# Patient Record
Sex: Male | Born: 1955 | Race: Black or African American | Hispanic: No | Marital: Single | Smoking: Current every day smoker
Health system: Southern US, Community
[De-identification: ages and names within clinical notes are randomized; demographics above are authoritative.]

## PROBLEM LIST (undated history)

## (undated) DIAGNOSIS — C61 Malignant neoplasm of prostate: Secondary | ICD-10-CM

## (undated) DIAGNOSIS — E119 Type 2 diabetes mellitus without complications: Secondary | ICD-10-CM

## (undated) DIAGNOSIS — I10 Essential (primary) hypertension: Secondary | ICD-10-CM

## (undated) DIAGNOSIS — J40 Bronchitis, not specified as acute or chronic: Secondary | ICD-10-CM

## (undated) DIAGNOSIS — Z87438 Personal history of other diseases of male genital organs: Secondary | ICD-10-CM

## (undated) DIAGNOSIS — E785 Hyperlipidemia, unspecified: Secondary | ICD-10-CM

## (undated) DIAGNOSIS — Z8719 Personal history of other diseases of the digestive system: Secondary | ICD-10-CM

---

## 2007-03-08 ENCOUNTER — Emergency Department (HOSPITAL_COMMUNITY): Admission: EM | Admit: 2007-03-08 | Discharge: 2007-03-08 | Payer: Self-pay | Admitting: Emergency Medicine

## 2007-03-09 ENCOUNTER — Ambulatory Visit (HOSPITAL_COMMUNITY): Payer: Self-pay | Admitting: Orthopaedic Surgery

## 2007-03-09 ENCOUNTER — Encounter (HOSPITAL_COMMUNITY): Admission: RE | Admit: 2007-03-09 | Discharge: 2007-04-08 | Payer: Self-pay | Admitting: Orthopaedic Surgery

## 2008-07-19 DIAGNOSIS — Z8711 Personal history of peptic ulcer disease: Secondary | ICD-10-CM

## 2008-07-19 HISTORY — DX: Personal history of peptic ulcer disease: Z87.11
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
## 2011-04-30 LAB — DIFFERENTIAL
Basophils Absolute: 0
Basophils Relative: 0
Eosinophils Absolute: 0
Eosinophils Relative: 1
Eosinophils Relative: 1
Lymphs Abs: 2.6
Monocytes Absolute: 0.5
Monocytes Relative: 9

## 2011-04-30 LAB — CBC
HCT: 49.7
HCT: 53 — ABNORMAL HIGH
Hemoglobin: 17.3 — ABNORMAL HIGH
MCHC: 34.8
MCV: 94.1
MCV: 94.6
Platelets: 195
RDW: 12.8
WBC: 9.4

## 2011-04-30 LAB — SEDIMENTATION RATE: Sed Rate: 2

## 2011-04-30 LAB — WOUND CULTURE

## 2011-09-23 ENCOUNTER — Encounter (HOSPITAL_COMMUNITY): Payer: Self-pay | Admitting: *Deleted

## 2011-09-23 ENCOUNTER — Emergency Department (HOSPITAL_COMMUNITY): Payer: Managed Care, Other (non HMO)

## 2011-09-23 ENCOUNTER — Emergency Department (HOSPITAL_COMMUNITY)
Admission: EM | Admit: 2011-09-23 | Discharge: 2011-09-23 | Disposition: A | Discharge status: Home / Self Care | Payer: Managed Care, Other (non HMO) | Attending: Emergency Medicine | Admitting: Emergency Medicine

## 2011-09-23 DIAGNOSIS — I10 Essential (primary) hypertension: Secondary | ICD-10-CM | POA: Insufficient documentation

## 2011-09-23 DIAGNOSIS — J40 Bronchitis, not specified as acute or chronic: Secondary | ICD-10-CM | POA: Insufficient documentation

## 2011-09-23 DIAGNOSIS — R05 Cough: Secondary | ICD-10-CM | POA: Insufficient documentation

## 2011-09-23 DIAGNOSIS — J3489 Other specified disorders of nose and nasal sinuses: Secondary | ICD-10-CM | POA: Insufficient documentation

## 2011-09-23 DIAGNOSIS — R509 Fever, unspecified: Secondary | ICD-10-CM | POA: Insufficient documentation

## 2011-09-23 DIAGNOSIS — Z79899 Other long term (current) drug therapy: Secondary | ICD-10-CM | POA: Insufficient documentation

## 2011-09-23 DIAGNOSIS — R059 Cough, unspecified: Secondary | ICD-10-CM | POA: Insufficient documentation

## 2011-09-23 DIAGNOSIS — E119 Type 2 diabetes mellitus without complications: Secondary | ICD-10-CM | POA: Insufficient documentation

## 2011-09-23 HISTORY — DX: Essential (primary) hypertension: I10

## 2011-09-23 MED ORDER — AZITHROMYCIN 250 MG PO TABS: 250.0000 mg | ORAL_TABLET | Freq: Every day | ORAL | Status: AC

## 2011-09-23 MED ORDER — ALBUTEROL SULFATE HFA 108 (90 BASE) MCG/ACT IN AERS: 1.0000 | INHALATION_SPRAY | Freq: Four times a day (QID) | RESPIRATORY_TRACT | Status: DC | PRN

## 2011-09-23 NOTE — ED Notes (Signed)
States fever at times also.

## 2011-09-23 NOTE — ED Provider Notes (Signed)
History    Scribed for Shelda Jakes, MD, the patient was seen in room APA19/APA19. This chart was scribed by Katha Cabal.   CSN: [id number]  Arrival date & time 09/23/11  1044   First MD Initiated Contact with Patient 09/23/11 1117      Chief Complaint  Patient presents with  . Cough    (Consider location/radiation/quality/duration/timing/severity/associated sxs/prior treatment) HPI  Pt was seen at 11:37 AM   Brandon Elliott is a 56 y.o. male who presents to the Emergency Department complaining of persistent productive cough for about 2 weeks.  Cough with yellow sputum.  Wife reports that patient's cough is worse at night.  Symptoms are associated with fever and nasal congestion.  Patient taking Mucinex with some relief.  Symptoms are not associated with headache, rash, neck pain, abdominal or back pain.    PCP Paulene Floor [place], [place])     Past Medical History  Diagnosis Date  . Diabetes mellitus   . Hypertension     History reviewed. No pertinent past surgical history.  No family history on file.  History  Substance Use Topics  . Smoking status: Current Everyday Smoker  . Smokeless tobacco: Not on file  . Alcohol Use: Yes      Review of Systems  All other systems reviewed and are negative.   Remaining review of systems negative except as noted in the HPI.   Allergies  Review of patient's allergies indicates no known allergies.  Home Medications   Current Outpatient Rx  Name Route Sig Dispense Refill  . AMLODIPINE BESYLATE 5 MG PO TABS Oral Take 5 mg by mouth daily.    . GUAIFENESIN ER 600 MG PO TB12 Oral Take 1,200 mg by mouth 2 (two) times daily.    Marland Kitchen LISINOPRIL-HYDROCHLOROTHIAZIDE 20-12.5 MG PO TABS Oral Take 1 tablet by mouth 2 (two) times daily.    Marland Kitchen NAPHAZOLINE HCL 0.012 % OP SOLN Both Eyes Place 1 drop into both eyes daily as needed. Red Eyes    . SITAGLIPTIN-METFORMIN HCL 50-500 MG PO TABS Oral Take 1 tablet by mouth 2 (two) times daily with  a meal.    . ALBUTEROL SULFATE HFA 108 (90 BASE) MCG/ACT IN AERS Inhalation Inhale 1-2 puffs into the lungs every 6 (six) hours as needed for wheezing. 1 Inhaler 0  . AZITHROMYCIN 250 MG PO TABS Oral Take 1 tablet (250 mg total) by mouth daily. Take first 2 tablets together, then 1 every day until finished. 6 tablet 0    BP 152/90  Pulse 107  Temp 98.6 F (37 C)  Resp 16  Ht 5\' 7"  (1.702 m)  Wt 185 lb (83.915 kg)  BMI 28.97 kg/m2  SpO2 99%  Physical Exam  Nursing note and vitals reviewed. Constitutional: He is oriented to person, place, and time. He appears well-developed and well-nourished. No distress.  HENT:  Head: Normocephalic and atraumatic.  Mouth/Throat: Uvula is midline and oropharynx is clear and moist. Mucous membranes are not dry.  Eyes: Conjunctivae and EOM are normal.  Neck: Neck supple.  Cardiovascular: Normal rate, regular rhythm and normal heart sounds.   No murmur heard. Pulmonary/Chest: Effort normal and breath sounds normal. No respiratory distress. He has no wheezes.  Abdominal: Soft. Bowel sounds are normal. There is no tenderness. There is no rebound and no guarding.  Musculoskeletal: Normal range of motion. He exhibits no edema.  Lymphadenopathy:    He has no cervical adenopathy.  Neurological: He is alert and oriented  to person, place, and time. No cranial nerve deficit or sensory deficit. Coordination normal.  Skin: Skin is warm and dry. No rash noted.  Psychiatric: He has a normal mood and affect. His behavior is normal.    ED Course  Procedures (including critical care time)   DIAGNOSTIC STUDIES: Oxygen Saturation is 99% on room air, normal by my interpretation.     COORDINATION OF CARE: 11:41 AM  Physical exam complete.  Radiological findings discussed with patient. CXR negative.      LABS / RADIOLOGY:   Labs Reviewed  GLUCOSE, CAPILLARY - Abnormal; Notable for the following:    Glucose-Capillary 142 (*)    All other components within  normal limits   Results for orders placed during the hospital encounter of 09/23/11  GLUCOSE, CAPILLARY      Component Value Range   Glucose-Capillary 142 (*) 70 - 99 (mg/dL)   Comment 1 Notify RN      Dg Chest 2 View  09/23/2011  *RADIOLOGY REPORT*  Clinical Data: Cough, congestion  CHEST - 2 VIEW  Comparison: None.  Findings: No active infiltrate or effusion is seen.  Mediastinal contours appear normal.  The heart is within normal limits in size. No bony abnormality is seen.  IMPRESSION: No active lung disease.  Original Report Authenticated By: Juline Patch, M.D.         MDM  Chest x-ray negative for pneumonia patient's symptoms consistent with upper respiratory infection most likely viral but with productive cough has a bronchitis component. Will give a trial of Zithromax antibiotic. Will start albuterol inhaler 2 puffs every 6 hours for the next week. Patient will continue Mucinex.       MEDICATIONS GIVEN IN THE E.D. Scheduled Meds:   Continuous Infusions:       IMPRESSION: 1. Bronchitis      NEW MEDICATIONS: New Prescriptions   ALBUTEROL (PROVENTIL HFA;VENTOLIN HFA) 108 (90 BASE) MCG/ACT INHALER    Inhale 1-2 puffs into the lungs every 6 (six) hours as needed for wheezing.   AZITHROMYCIN (ZITHROMAX) 250 MG TABLET    Take 1 tablet (250 mg total) by mouth daily. Take first 2 tablets together, then 1 every day until finished.      I personally performed the services described in this documentation, which was scribed in my presence. The recorded information has been reviewed and considered.        Shelda Jakes, MD 09/23/11 [phone]

## 2011-09-23 NOTE — ED Notes (Signed)
Pt c/o cough for several weeks, pt states that it will get better then get worse again, c/o weakness when the cough is worse. Fever at times.

## 2011-09-23 NOTE — ED Notes (Signed)
Pt states productive cough, yellow in color with chest congestion x 1 - 2 weeks. NAD.

## 2011-09-23 NOTE — Discharge Instructions (Signed)
Bronchitis Bronchitis is a problem of the air tubes leading to your lungs. This problem makes it hard for air to get in and out of the lungs. You may cough a lot because your air tubes are narrow. Going without care can cause lasting (chronic) bronchitis. HOME CARE   Drink enough fluids to keep your pee (urine) clear or pale yellow.   Use a cool mist humidifier.   Quit smoking if you smoke. If you keep smoking, the bronchitis might not get better.   Only take medicine as told by your doctor.  GET HELP RIGHT AWAY IF:   Coughing keeps you awake.   You start to wheeze.   You become more sick or weak.   You have a hard time breathing or get short of breath.   You cough up blood.   Coughing lasts more than 2 weeks.   You have a fever.   Your baby is older than 3 months with a rectal temperature of 102 F (38.9 C) or higher.   Your baby is 64 months old or younger with a rectal temperature of 100.4 F (38 C) or higher.  MAKE SURE YOU:  Understand these instructions.   Will watch your condition.   Will get help right away if you are not doing well or get worse.  Document Released: 12/22/2007 Document Revised: 06/24/2011 Document Reviewed: 06/06/2009 [facility] Patient Information 2012 [place], [place].  Use albuterol inhaler 2 puffs every 6 hours for the next week continue Mucinex take antibiotic as directed. Followup with your Dr. In [place] if not improving in 2-4 days. Return for new worse symptoms.

## 2011-09-23 NOTE — ED Notes (Signed)
Pt returned from xray

## 2012-02-06 ENCOUNTER — Encounter (HOSPITAL_COMMUNITY): Payer: Self-pay | Admitting: *Deleted

## 2012-02-06 ENCOUNTER — Emergency Department (HOSPITAL_COMMUNITY)
Admission: EM | Admit: 2012-02-06 | Discharge: 2012-02-06 | Disposition: A | Discharge status: Home / Self Care | Payer: Managed Care, Other (non HMO) | Attending: Emergency Medicine | Admitting: Emergency Medicine

## 2012-02-06 DIAGNOSIS — F17200 Nicotine dependence, unspecified, uncomplicated: Secondary | ICD-10-CM | POA: Insufficient documentation

## 2012-02-06 DIAGNOSIS — N390 Urinary tract infection, site not specified: Secondary | ICD-10-CM | POA: Insufficient documentation

## 2012-02-06 DIAGNOSIS — I10 Essential (primary) hypertension: Secondary | ICD-10-CM | POA: Insufficient documentation

## 2012-02-06 DIAGNOSIS — E119 Type 2 diabetes mellitus without complications: Secondary | ICD-10-CM | POA: Insufficient documentation

## 2012-02-06 LAB — CBC WITH DIFFERENTIAL/PLATELET
Basophils Absolute: 0 10*3/uL (ref 0.0–0.1)
Eosinophils Absolute: 0.1 10*3/uL (ref 0.0–0.7)
Eosinophils Relative: 1 % (ref 0–5)
Lymphs Abs: 2.7 10*3/uL (ref 0.7–4.0)
MCH: 32.9 pg (ref 26.0–34.0)
MCV: 93.9 fL (ref 78.0–100.0)
Monocytes Absolute: 0.8 10*3/uL (ref 0.1–1.0)
Platelets: 192 10*3/uL (ref 150–400)
RDW: 12.8 % (ref 11.5–15.5)

## 2012-02-06 LAB — BASIC METABOLIC PANEL
Calcium: 10 mg/dL (ref 8.4–10.5)
Creatinine, Ser: 0.87 mg/dL (ref 0.50–1.35)
GFR calc non Af Amer: >90 mL/min (ref >90)
Glucose, Bld: 165 mg/dL — ABNORMAL HIGH (ref 70–99)
Sodium: 133 mEq/L — ABNORMAL LOW (ref 135–145)

## 2012-02-06 LAB — URINE MICROSCOPIC-ADD ON

## 2012-02-06 LAB — URINALYSIS, ROUTINE W REFLEX MICROSCOPIC
Ketones, ur: 15 mg/dL — AB
Protein, ur: NEGATIVE mg/dL
Urobilinogen, UA: 0.2 mg/dL (ref 0.0–1.0)

## 2012-02-06 MED ORDER — ONDANSETRON 8 MG PO TBDP
8.0000 mg | ORAL_TABLET | Freq: Once | ORAL | Status: AC
  Administered 2012-02-06: 8 mg via ORAL
  Filled 2012-02-06: qty 1

## 2012-02-06 MED ORDER — CEPHALEXIN 500 MG PO CAPS: 500.0000 mg | ORAL_CAPSULE | Freq: Four times a day (QID) | ORAL | Status: AC

## 2012-02-06 MED ORDER — CEPHALEXIN 500 MG PO CAPS
500.0000 mg | ORAL_CAPSULE | Freq: Once | ORAL | Status: AC
  Administered 2012-02-06: 500 mg via ORAL
  Filled 2012-02-06: qty 1

## 2012-02-06 MED ORDER — OXYCODONE-ACETAMINOPHEN 5-325 MG PO TABS
2.0000 | ORAL_TABLET | Freq: Once | ORAL | Status: AC
  Administered 2012-02-06: 2 via ORAL
  Filled 2012-02-06: qty 2

## 2012-02-06 NOTE — ED Provider Notes (Signed)
History     CSN: [id number]  Arrival date & time 02/06/12  0148   First MD Initiated Contact with Patient 02/06/12 0208      Chief Complaint - abdominal pain   Patient is a 56 y.o. male presenting with abdominal pain. The history is provided by the patient.  Abdominal Pain The primary symptoms of the illness include abdominal pain, nausea, vomiting and dysuria. The primary symptoms of the illness do not include fever, shortness of breath, diarrhea, hematemesis or hematochezia. The current episode started 6 to 12 hours ago. The onset of the illness was gradual. The problem has not changed since onset. The illness is associated with alcohol use. The patient has not had a change in bowel habit. Symptoms associated with the illness do not include chills, constipation or back pain. Significant associated medical issues include PUD.  Pt reports he drank "bootleg liquor" last night He woke up this morning with lower abdominal pain - feels like "gas in my stomach" He reports nonbloody emesis He does not recall having this previously No focal weakness He does report dysuria Denies CP/SOB He has normal BM this past morning, no blood reported  Past Medical History  Diagnosis Date  . Diabetes mellitus   . Hypertension     History reviewed. No pertinent past surgical history.  History reviewed. No pertinent family history.  History  Substance Use Topics  . Smoking status: Current Everyday Smoker  . Smokeless tobacco: Not on file  . Alcohol Use: Yes      Review of Systems  Constitutional: Negative for fever and chills.  Respiratory: Negative for shortness of breath.   Cardiovascular: Negative for chest pain.  Gastrointestinal: Positive for nausea, vomiting and abdominal pain. Negative for diarrhea, constipation, hematochezia and hematemesis.  Genitourinary: Positive for dysuria.  Musculoskeletal: Negative for back pain.  All other systems reviewed and are  negative.    Allergies  Review of patient's allergies indicates no known allergies.  Home Medications   Current Outpatient Rx  Name Route Sig Dispense Refill  . LISINOPRIL-HYDROCHLOROTHIAZIDE 20-12.5 MG PO TABS Oral Take 1 tablet by mouth 2 (two) times daily.    Marland Kitchen NAPHAZOLINE HCL 0.012 % OP SOLN Both Eyes Place 1 drop into both eyes daily as needed. Red Eyes    . SITAGLIPTIN-METFORMIN HCL 50-500 MG PO TABS Oral Take 1 tablet by mouth 2 (two) times daily with a meal.    . ALBUTEROL SULFATE HFA 108 (90 BASE) MCG/ACT IN AERS Inhalation Inhale 1-2 puffs into the lungs every 6 (six) hours as needed for wheezing. 1 Inhaler 0  . AMLODIPINE BESYLATE 5 MG PO TABS Oral Take 5 mg by mouth daily.    . GUAIFENESIN ER 600 MG PO TB12 Oral Take 1,200 mg by mouth 2 (two) times daily.      BP 165/87  Pulse 84  Temp 98.7 F (37.1 C) (Oral)  Resp 18  Ht 5\' 7"  (1.702 m)  Wt 220 lb (99.791 kg)  BMI 34.46 kg/m2  SpO2 99%  Physical Exam CONSTITUTIONAL: Well developed/well nourished HEAD AND FACE: Normocephalic/atraumatic EYES: EOMI/PERRL ENMT: Mucous membranes moist NECK: supple no meningeal signs SPINE:entire spine nontender CV: S1/S2 noted, no murmurs/rubs/gallops noted LUNGS: Lungs are clear to auscultation bilaterally, no apparent distress ABDOMEN: soft, mild suprapubic tenderness, no rebound or guarding, +BS GU:no cva tenderness, uncircumcised, no hernia or testicular tenderness, chaperone present Rectal - stool color normal, no mass, no abscess, and prostate not enlarged and nontender NEURO: Pt is awake/alert,  moves all extremitiesx4 EXTREMITIES: pulses normal, full ROM SKIN: warm, color normal PSYCH: no abnormalities of mood noted  ED Course  Procedures    Labs Reviewed  CBC WITH DIFFERENTIAL  BASIC METABOLIC PANEL  URINALYSIS, ROUTINE W REFLEX MICROSCOPIC  OCCULT BLOOD X 1 CARD TO LAB, STOOL  3:17 AM Pt well appearing He does not want IV meds Will give PO challenge, check  labs/urine and reassess  4:12 AM Will treat for UTI Keflex started Urine culture sent He is nontoxic in appearance.  He feels improved Stable for d/c home We discussed strict return precautions  MDM  Nursing notes including past medical history and social history reviewed and considered in documentation All labs/vitals reviewed and considered         Joya Gaskins, MD 02/06/12 [phone]

## 2012-02-06 NOTE — ED Notes (Signed)
occult blood test  NEGATIVE / performed by this RN

## 2012-02-06 NOTE — ED Notes (Signed)
Verified with Laboratory that urine culture will be set up

## 2012-02-06 NOTE — ED Notes (Addendum)
Pt reporting pain in lower abdomen, starting on Sat.  Reports pain does radiate around left side to back.  States he did vomit "a couple times". Reporting current nausea, decreased appetite.  LBM previously today. States "It just feels like there is a gas pocket in my stomach".

## 2012-02-07 LAB — OCCULT BLOOD, POC DEVICE: Fecal Occult Bld: NEGATIVE

## 2012-02-08 LAB — URINE CULTURE: Colony Count: >=100000

## 2012-02-09 NOTE — ED Notes (Signed)
+   Urine Patient treated with Keflex-sensitive to same-chart appended per protocol MD. 

## 2012-03-18 ENCOUNTER — Emergency Department (HOSPITAL_COMMUNITY): Payer: Managed Care, Other (non HMO)

## 2012-03-18 ENCOUNTER — Emergency Department (HOSPITAL_COMMUNITY)
Admission: EM | Admit: 2012-03-18 | Discharge: 2012-03-18 | Disposition: A | Discharge status: Home / Self Care | Payer: Managed Care, Other (non HMO) | Attending: Emergency Medicine | Admitting: Emergency Medicine

## 2012-03-18 ENCOUNTER — Encounter (HOSPITAL_COMMUNITY): Payer: Self-pay | Admitting: *Deleted

## 2012-03-18 DIAGNOSIS — R51 Headache: Secondary | ICD-10-CM | POA: Insufficient documentation

## 2012-03-18 DIAGNOSIS — S0120XA Unspecified open wound of nose, initial encounter: Secondary | ICD-10-CM | POA: Insufficient documentation

## 2012-03-18 DIAGNOSIS — Z79899 Other long term (current) drug therapy: Secondary | ICD-10-CM | POA: Insufficient documentation

## 2012-03-18 DIAGNOSIS — I10 Essential (primary) hypertension: Secondary | ICD-10-CM | POA: Insufficient documentation

## 2012-03-18 DIAGNOSIS — S0230XA Fracture of orbital floor, unspecified side, initial encounter for closed fracture: Secondary | ICD-10-CM | POA: Insufficient documentation

## 2012-03-18 DIAGNOSIS — F17200 Nicotine dependence, unspecified, uncomplicated: Secondary | ICD-10-CM | POA: Insufficient documentation

## 2012-03-18 DIAGNOSIS — W19XXXA Unspecified fall, initial encounter: Secondary | ICD-10-CM | POA: Insufficient documentation

## 2012-03-18 DIAGNOSIS — E119 Type 2 diabetes mellitus without complications: Secondary | ICD-10-CM | POA: Insufficient documentation

## 2012-03-18 MED ORDER — HYDROCODONE-ACETAMINOPHEN 5-325 MG PO TABS: 1.0000 | ORAL_TABLET | Freq: Four times a day (QID) | ORAL | Status: AC | PRN
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
## 2012-06-14 ENCOUNTER — Emergency Department (HOSPITAL_COMMUNITY)
Admission: EM | Admit: 2012-06-14 | Discharge: 2012-06-14 | Disposition: A | Discharge status: Home / Self Care | Payer: Managed Care, Other (non HMO) | Attending: Emergency Medicine | Admitting: Emergency Medicine

## 2012-06-14 ENCOUNTER — Encounter (HOSPITAL_COMMUNITY): Payer: Self-pay | Admitting: *Deleted

## 2012-06-14 DIAGNOSIS — A599 Trichomoniasis, unspecified: Secondary | ICD-10-CM

## 2012-06-14 DIAGNOSIS — I10 Essential (primary) hypertension: Secondary | ICD-10-CM | POA: Insufficient documentation

## 2012-06-14 DIAGNOSIS — R1032 Left lower quadrant pain: Secondary | ICD-10-CM | POA: Insufficient documentation

## 2012-06-14 DIAGNOSIS — R000 Tachycardia, unspecified: Secondary | ICD-10-CM | POA: Insufficient documentation

## 2012-06-14 DIAGNOSIS — Z87448 Personal history of other diseases of urinary system: Secondary | ICD-10-CM | POA: Insufficient documentation

## 2012-06-14 DIAGNOSIS — F17200 Nicotine dependence, unspecified, uncomplicated: Secondary | ICD-10-CM | POA: Insufficient documentation

## 2012-06-14 DIAGNOSIS — Z79899 Other long term (current) drug therapy: Secondary | ICD-10-CM | POA: Insufficient documentation

## 2012-06-14 DIAGNOSIS — E119 Type 2 diabetes mellitus without complications: Secondary | ICD-10-CM | POA: Insufficient documentation

## 2012-06-14 LAB — COMPREHENSIVE METABOLIC PANEL
ALT: 24 U/L (ref 0–53)
AST: 17 U/L (ref 0–37)
Albumin: 3.7 g/dL (ref 3.5–5.2)
CO2: 28 mEq/L (ref 19–32)
Chloride: 94 mEq/L — ABNORMAL LOW (ref 96–112)
GFR calc non Af Amer: 89 mL/min — ABNORMAL LOW (ref >90)
Potassium: 3.8 mEq/L (ref 3.5–5.1)
Sodium: 133 mEq/L — ABNORMAL LOW (ref 135–145)
Total Bilirubin: 0.6 mg/dL (ref 0.3–1.2)

## 2012-06-14 LAB — URINE MICROSCOPIC-ADD ON

## 2012-06-14 LAB — CBC WITH DIFFERENTIAL/PLATELET
Basophils Absolute: 0 10*3/uL (ref 0.0–0.1)
Basophils Relative: 0 % (ref 0–1)
HCT: 43.2 % (ref 39.0–52.0)
Lymphocytes Relative: 19 % (ref 12–46)
MCHC: 34.5 g/dL (ref 30.0–36.0)
Neutro Abs: 6.6 10*3/uL (ref 1.7–7.7)
Neutrophils Relative %: 75 % (ref 43–77)
Platelets: 160 10*3/uL (ref 150–400)
RDW: 13 % (ref 11.5–15.5)
WBC: 8.8 10*3/uL (ref 4.0–10.5)

## 2012-06-14 LAB — URINALYSIS, ROUTINE W REFLEX MICROSCOPIC
Glucose, UA: 500 mg/dL — AB
Leukocytes, UA: NEGATIVE
Nitrite: NEGATIVE
Urobilinogen, UA: 0.2 mg/dL (ref 0.0–1.0)

## 2012-06-14 LAB — MAGNESIUM: Magnesium: 1.6 mg/dL (ref 1.5–2.5)

## 2012-06-14 MED ORDER — CIPROFLOXACIN HCL 250 MG PO TABS
500.0000 mg | ORAL_TABLET | Freq: Once | ORAL | Status: AC
  Administered 2012-06-14: 500 mg via ORAL
  Filled 2012-06-14: qty 2

## 2012-06-14 MED ORDER — CIPROFLOXACIN HCL 500 MG PO TABS: 500.0000 mg | ORAL_TABLET | Freq: Two times a day (BID) | ORAL | Status: DC

## 2012-06-14 MED ORDER — METRONIDAZOLE 500 MG PO TABS
500.0000 mg | ORAL_TABLET | Freq: Once | ORAL | Status: AC
  Administered 2012-06-14: 500 mg via ORAL
  Filled 2012-06-14: qty 1

## 2012-06-14 MED ORDER — HYDROCODONE-ACETAMINOPHEN 5-325 MG PO TABS: ORAL_TABLET | ORAL | Status: DC

## 2012-06-14 MED ORDER — METRONIDAZOLE 500 MG PO TABS: 500.0000 mg | ORAL_TABLET | Freq: Three times a day (TID) | ORAL | Status: DC

## 2012-06-14 NOTE — Discharge Instructions (Signed)
DRINK MORE FLUIDS!!! Take the antibiotics until gone. Take the norco for pain as needed. Return to the ED if you get a fever, worsening abdominal pain, vomiting, fever, get lightheaded, dizzy or feel worse in any way.

## 2012-06-14 NOTE — ED Provider Notes (Signed)
History   This chart was scribed for Brandon Givens, MD scribed by Brandon Elliott. The patient was seen in room APA19/APA19 at 18:02   CSN: [id number]  Arrival date & time 06/14/12  1735   Chief Complaint  Patient presents with  . Dysuria    (Consider location/radiation/quality/duration/timing/severity/associated sxs/prior treatment) Patient is a 56 y.o. male presenting with dysuria. The history is provided by the patient. No language interpreter was used.  Dysuria    Brandon Elliott is a 56 y.o. male who presents to the Emergency Department complaining of constant  difficulty urinating with associated dysuria,  urgency, and left lower abd pain only with coughing and walking, onset 2 days ago. Patient also notes constipation, explaining his last BM was yesterday evening, which he says was hard and abnormal for him. He says he normally does not experience constipation, but does report a hx of similar urinary sxs, citing he believes it was diagnosed as a  UTI earlier in the summer.  He denies frequency, nausea, vomitng, diarrhea, SOB, CP, dizziness, lightheadedness, or changes in medications or his diet recently.  The patient reportedly uses medications janumet for DM, tricor and Lipitor for high cholesterol, Lisinopril HCTZ and hydrochlorothiazide for HTN, and an albuterol inhaler.   PCP: PA Benay Pillow at [facility]   Past Medical History  Diagnosis Date  . Diabetes mellitus   . Hypertension   . Enlarged prostate     History reviewed. No pertinent past surgical history.  History reviewed. No pertinent family history.  History  Substance Use Topics  . Smoking status: Current Every Day Smoker    Types: Cigarettes  . Smokeless tobacco: Not on file  . Alcohol Use: Yes  Smokes 1 pack a day and drinks couple beers a day. 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     Review of Systems  Genitourinary: Positive for dysuria.   10 Systems reviewed and  are negative for acute change except as noted in the HPI. Allergies  Review of patient's allergies indicates no known allergies.  Home Medications   Current Outpatient Rx  Name  Route  Sig  Dispense  Refill  . ALBUTEROL SULFATE HFA 108 (90 BASE) MCG/ACT IN AERS   Inhalation   Inhale 1-2 puffs into the lungs every 6 (six) hours as needed for wheezing.   1 Inhaler   0   . HYDROCHLOROTHIAZIDE 12.5 MG PO CAPS   Oral   Take 12.5 mg by mouth daily.         Marland Kitchen LISINOPRIL-HYDROCHLOROTHIAZIDE 20-12.5 MG PO TABS   Oral   Take 1 tablet by mouth 2 (two) times daily.         Marland Kitchen SITAGLIPTIN-METFORMIN HCL 50-500 MG PO TABS   Oral   Take 1 tablet by mouth 2 (two) times daily with a meal.         lipitor tricor  BP 154/79  Pulse 130  Temp 99.9 F (37.7 C)  Resp 18  Ht 5\' 6"  (1.676 m)  Wt 195 lb (88.451 kg)  BMI 31.47 kg/m2  SpO2 100%  Vital signs normal hypertension and tachycardia with low-grade temp   Physical Exam  Nursing note and vitals reviewed. Constitutional: He is oriented to person, place, and time. He appears well-developed and well-nourished. No distress.  HENT:  Head: Normocephalic and atraumatic.  Eyes: Conjunctivae normal and EOM are normal. Pupils are equal, round, and reactive to light.  Neck: Neck supple. No tracheal  deviation present.  Cardiovascular: Regular rhythm.  Tachycardia present.   Pulmonary/Chest: Effort normal and breath sounds normal. No respiratory distress. He has no wheezes. He has no rales.  Abdominal: Soft. Bowel sounds are normal. He exhibits no distension.  Musculoskeletal: Normal range of motion.  Neurological: He is alert and oriented to person, place, and time. No sensory deficit.  Skin: Skin is warm and dry.  Psychiatric: He has a normal mood and affect. His behavior is normal.    ED Course  Procedures (including critical care time)   Medications  ciprofloxacin (CIPRO) tablet 500 mg (500 mg Oral Given 06/14/12 2042)    metroNIDAZOLE (FLAGYL) tablet 500 mg (500 mg Oral Given 06/14/12 2042)    DIAGNOSTIC STUDIES: Oxygen Saturation is 100% on room air, normal by my interpretation.    COORDINATION OF CARE: 18:05: Physical exam performed.   Bladder scan was 85 cc after voiding  19:45 VS shows he still has tachycardia.  20:00 have discussed his UA results. Pt still has some mild LLQ pain which can be early divertiulitis, will start on cipro/flagyl which will also treat his trichomoniasis. Pt wants to drink fluids instead of getting an IV, states he didn't eat or drink today because he was very busy, not because he felt bad.   21:16: Heart rate improved at 106. Patient encouraged to continue drinking fluids.   23:00 Hr gets down to 101 per wife. Pt drinking, but also sleeping, States he feels fine.   Orthostatic VS show + with pulses on standing, states he feels fine and  patient wants to go home. Have discussed his LLQ pain could be early diverticulitis, and will treat with antibiotics. Pt still does not want IV or CT scan to be done.    Results for orders placed during the hospital encounter of 06/14/12  URINALYSIS, ROUTINE W REFLEX MICROSCOPIC      Component Value Range   Color, Urine YELLOW  YELLOW   APPearance CLEAR  CLEAR   Specific Gravity, Urine 1.025  1.005 - 1.030   pH 6.0  5.0 - 8.0   Glucose, UA 500 (*) NEGATIVE mg/dL   Hgb urine dipstick SMALL (*) NEGATIVE   Bilirubin Urine SMALL (*) NEGATIVE   Ketones, ur 15 (*) NEGATIVE mg/dL   Protein, ur TRACE (*) NEGATIVE mg/dL   Urobilinogen, UA 0.2  0.0 - 1.0 mg/dL   Nitrite NEGATIVE  NEGATIVE   Leukocytes, UA NEGATIVE  NEGATIVE  CBC WITH DIFFERENTIAL      Component Value Range   WBC 8.8  4.0 - 10.5 K/uL   RBC 4.70  4.22 - 5.81 MIL/uL   Hemoglobin 14.9  13.0 - 17.0 g/dL   HCT 14.7  82.9 - 56.2 %   MCV 91.9  78.0 - 100.0 fL   MCH 31.7  26.0 - 34.0 pg   MCHC 34.5  30.0 - 36.0 g/dL   RDW 13.0  86.5 - 78.4 %   Platelets 160  150 - 400 K/uL    Neutrophils Relative 75  43 - 77 %   Neutro Abs 6.6  1.7 - 7.7 K/uL   Lymphocytes Relative 19  12 - 46 %   Lymphs Abs 1.6  0.7 - 4.0 K/uL   Monocytes Relative 6  3 - 12 %   Monocytes Absolute 0.5  0.1 - 1.0 K/uL   Eosinophils Relative 0  0 - 5 %   Eosinophils Absolute 0.0  0.0 - 0.7 K/uL   Basophils Relative 0  0 - 1 %   Basophils Absolute 0.0  0.0 - 0.1 K/uL  COMPREHENSIVE METABOLIC PANEL      Component Value Range   Sodium 133 (*) 135 - 145 mEq/L   Potassium 3.8  3.5 - 5.1 mEq/L   Chloride 94 (*) 96 - 112 mEq/L   CO2 28  19 - 32 mEq/L   Glucose, Bld 263 (*) 70 - 99 mg/dL   BUN 16  6 - 23 mg/dL   Creatinine, Ser 1.61  0.50 - 1.35 mg/dL   Calcium 9.7  8.4 - 09.6 mg/dL   Total Protein 7.4  6.0 - 8.3 g/dL   Albumin 3.7  3.5 - 5.2 g/dL   AST 17  0 - 37 U/L   ALT 24  0 - 53 U/L   Alkaline Phosphatase 78  39 - 117 U/L   Total Bilirubin 0.6  0.3 - 1.2 mg/dL   GFR calc non Af Amer 89 (*) >90 mL/min   GFR calc Af Amer >90  >90 mL/min  TROPONIN I      Component Value Range   Troponin I <0.30  <0.30 ng/mL  MAGNESIUM      Component Value Range   Magnesium 1.6  1.5 - 2.5 mg/dL  URINE MICROSCOPIC-ADD ON      Component Value Range   Squamous Epithelial / LPF RARE  RARE   WBC, UA 0-2  <3 WBC/hpf   RBC / HPF 3-6  <3 RBC/hpf   Bacteria, UA RARE  RARE   Urine-Other TRICHOMONAS PRESENT     Laboratory interpretation all normal except + trichomonas, hyperglycemia, low sodium and chloride     Date: 06/14/2012  Rate: 123  Rhythm: sinus tachycardia  QRS Axis: left  Intervals: normal  ST/T Wave abnormalities: normal  Conduction Disutrbances:none  Narrative Interpretation: Q waves inf/ant leads  Old EKG Reviewed: none available    1. LLQ abdominal pain   2. Trichomoniasis   3. Tachycardia    New Prescriptions   CIPROFLOXACIN (CIPRO) 500 MG TABLET    Take 1 tablet (500 mg total) by mouth 2 (two) times daily.   HYDROCODONE-ACETAMINOPHEN (NORCO) 5-325 MG PER TABLET    Take 1 or  2 po Q 6hrs for pain   METRONIDAZOLE (FLAGYL) 500 MG TABLET    Take 1 tablet (500 mg total) by mouth 3 (three) times daily.   Plan discharge  Devoria Albe, MD, FACEP    MDM   I personally performed the services described in this documentation, which was scribed in my presence. The recorded information has been reviewed and considered.  Devoria Albe, MD, Armando Gang         Brandon Givens, MD 06/14/12 [phone]

## 2012-06-14 NOTE — ED Notes (Signed)
abd pain and dysuria since yesterday,No NV.  Alert.fever

## 2012-06-14 NOTE — ED Notes (Signed)
Patient drinking po fluids

## 2012-06-14 NOTE — ED Notes (Signed)
Bladder scan performed with 97ml noted

## 2012-06-29 DIAGNOSIS — C61 Malignant neoplasm of prostate: Secondary | ICD-10-CM

## 2012-06-29 DIAGNOSIS — Z8546 Personal history of malignant neoplasm of prostate: Secondary | ICD-10-CM | POA: Insufficient documentation

## 2012-06-29 HISTORY — DX: Malignant neoplasm of prostate: C61

## 2012-09-04 ENCOUNTER — Encounter: Payer: Self-pay | Admitting: Radiation Oncology

## 2012-09-04 NOTE — Progress Notes (Addendum)
New Consult Prostate Cancer Biopsy 06/29/12= Adenocarcinoma,Gleason 3+4=7, & 3+3=6,Volume=38cc,PSA=4.23   Divorced, Naval architect, 4 children, fiance Sunday Shams,  Father deceased prostate cancer age 59, mother deceased stroke age 28, Patient  alert,oriented x3, no dysuria,  Nocturia x2 not fully emptying bladder sometimes, regular bowels, no c/o pain  Interested in seed implant  Allergies:NKDA  No history Radiation  No history of a Pacemaker

## 2012-09-05 ENCOUNTER — Ambulatory Visit
Admission: RE | Admit: 2012-09-05 | Discharge: 2012-09-05 | Disposition: A | Discharge status: Home / Self Care | Payer: Managed Care, Other (non HMO) | Source: Ambulatory Visit | Attending: Radiation Oncology | Admitting: Radiation Oncology

## 2012-09-05 ENCOUNTER — Encounter: Payer: Self-pay | Admitting: Radiation Oncology

## 2012-09-05 VITALS — BP 125/79 | HR 114 | Temp 98.4°F | Resp 20 | Ht 66.0 in | Wt 191.7 lb

## 2012-09-05 DIAGNOSIS — I10 Essential (primary) hypertension: Secondary | ICD-10-CM | POA: Insufficient documentation

## 2012-09-05 DIAGNOSIS — C61 Malignant neoplasm of prostate: Secondary | ICD-10-CM | POA: Insufficient documentation

## 2012-09-05 DIAGNOSIS — Z8042 Family history of malignant neoplasm of prostate: Secondary | ICD-10-CM | POA: Insufficient documentation

## 2012-09-05 DIAGNOSIS — E119 Type 2 diabetes mellitus without complications: Secondary | ICD-10-CM | POA: Insufficient documentation

## 2012-09-05 HISTORY — DX: Malignant neoplasm of prostate: C61

## 2012-09-05 HISTORY — DX: Hyperlipidemia, unspecified: E78.5

## 2012-09-05 NOTE — Progress Notes (Signed)
[facility] Radiation Oncology NEW PATIENT EVALUATION  Name: Brandon Elliott MRN: [id number]  Date:   09/05/2012           DOB: 17-Mar-1956  Status: outpatient   CC: Bennie Pierini, FNP  Valetta Fuller, MD    REFERRING PHYSICIAN: Valetta Fuller, MD   DIAGNOSIS: Stage TI C. intermediate risk adenocarcinoma prostate   HISTORY OF PRESENT ILLNESS:  Brandon Elliott is a 57 y.o. male who is seen today for the courtesy of Dr. Barron Alvine for consideration of radiation therapy in the management of his stage TI C. intermediate risk adenocarcinoma prostate. He was found to have an elevated PSA of 4.23 on 04/20/2012. He was referred by Paulene Floor nurse practitioner to Dr. Isabel Caprice for further evaluation. His exam was benign. He underwent ultrasound-guided biopsies on 06/29/2012 was found have Gleason 7 (3+4) involving 40% of one core from the right base along with Gleason 6 (3+3) involving 30% of one core from the left lateral base, 10% of one core from left lateral mid gland, 10% of one core from the left mid gland, 5% of one core from left lateral apex and less than 5% of one core from the left apex. His gland volume was approximately 38 cc. He is doing well from a GU and GI standpoint. His I PSS score is 6. He is potent.  PREVIOUS RADIATION THERAPY: No   PAST MEDICAL HISTORY:  has a past medical history of Hypertension; Enlarged prostate; Prostate cancer (06/29/12); Hyperlipidemia; Back pain; Joint pain; BPH (benign prostatic hypertrophy) with urinary obstruction; Sinusitis; Diabetes mellitus; and Ulcer (2010).     PAST SURGICAL HISTORY: No past surgical history on file.   FAMILY HISTORY: family history includes Cancer in his father and Stroke in his mother. His father died of metastatic prostate cancer at age 74. His mother died following a stroke at 33.   SOCIAL HISTORY:  reports that he has been smoking Cigarettes.  He has a 30 pack-year smoking history. He has never used  smokeless tobacco. He reports that he drinks about 3.0 ounces of alcohol per week. He reports that he does not use illicit drugs. Divorced, 4 children. He is currently engaged. He works as a Investment banker, operational".   ALLERGIES: Review of patient's allergies indicates no known allergies.   MEDICATIONS:  Current Outpatient Prescriptions  Medication Sig Dispense Refill  . amLODipine (NORVASC) 5 MG tablet Take 5 mg by mouth daily.      Marland Kitchen atorvastatin (LIPITOR) 40 MG tablet Take 40 mg by mouth daily.      . fenofibrate (TRICOR) 145 MG tablet Take 145 mg by mouth daily.      Marland Kitchen HYDROcodone-acetaminophen (NORCO) 5-325 MG per tablet Take 1 or 2 po Q 6hrs for pain  15 tablet  0  . lisinopril-hydrochlorothiazide (PRINZIDE,ZESTORETIC) 20-12.5 MG per tablet Take 1 tablet by mouth daily.       . sitaGLIPtan-metformin (JANUMET) 50-500 MG per tablet Take 1 tablet by mouth 2 (two) times daily with a meal.       No current facility-administered medications for this encounter.     REVIEW OF SYSTEMS:  Pertinent items are noted in HPI.    PHYSICAL EXAM: Alert and oriented 56-year black male appearing younger than stated age. Wt Readings from Last 3 Encounters:  06/14/12 195 lb (88.451 kg)  02/06/12 220 lb (99.791 kg)  09/23/11 185 lb (83.915 kg)   Temp Readings from Last 3 Encounters:  06/14/12 99.7 F (37.6 C) Oral  03/18/12 98.2 F (36.8 C) Oral  02/06/12 98.7 F (37.1 C) Oral   BP Readings from Last 3 Encounters:  06/14/12 125/78  03/18/12 156/90  02/06/12 165/87   Pulse Readings from Last 3 Encounters:  06/14/12 86  02/06/12 84  09/23/11 107   Head and neck examination: Grossly unremarkable. Nodes: Without palpable cervical or supraclavicular lymphadenopathy. Chest: Lungs clear. Back: Without spinal or CVA tenderness. Heart: Regular rate rhythm. Abdomen: Without masses organomegaly. Genitalia: Unremarkable to inspection. Rectal: The prostate gland is normal in size and is  without focal induration or nodularity. Extremities: Without edema. Neurologic examination: Grossly nonfocal.    LABORATORY DATA:  Lab Results  Component Value Date   WBC 8.8 06/14/2012   HGB 14.9 06/14/2012   HCT 43.2 06/14/2012   MCV 91.9 06/14/2012   PLT 160 06/14/2012   Lab Results  Component Value Date   NA 133* 06/14/2012   K 3.8 06/14/2012   CL 94* 06/14/2012   CO2 28 06/14/2012   Lab Results  Component Value Date   ALT 24 06/14/2012   AST 17 06/14/2012   ALKPHOS 78 06/14/2012   BILITOT 0.6 06/14/2012   PSA 4.23 from 04/20/2012.   IMPRESSION: Stage TI C. intermediate risk adenocarcinoma prostate. I explained to Mr. Larkey and his family that his prognosis is related to his stage, PSA level, and Gleason score. His stage and PSA level are favorable while his Gleason score of 7 is of intermediate favorability. His management options include surgery versus close surveillance versus radiation therapy. In view of his Gleason score 7 and relatively young age I do not recommend close surveillance. Radiation therapy options include seed implantation alone or 8 weeks of external beam/IMRT. He does have low volume Gleason 7, and thus he is a candidate for seed implantation alone. We discussed the potential acute and late toxicities of radiation therapy. Considering his age I would strongly consider surgery, but the patient is not interested in surgery. He would rather have a seed implant which I think would be a reasonable choice. We'll move ahead with his CT arch study today and get him scheduled for seed implantation with Dr. Isabel Caprice. Consent is signed today.   PLAN: As discussed above. I suspect we'll get him scheduled for seed implantation in late March early April.  I spent 60 minutes minutes face to face with the patient and more than 50% of that time was spent in counseling and/or coordination of care.

## 2012-09-05 NOTE — Progress Notes (Signed)
   Complex CT simulation/treatment planning note: The patient underwent his CT arch study today in preparation of a prostate seed implant. His pelvis was scanned. I then contoured his prostate and projected the prostate over his pubic arch. His prostate volume is 32.7 cc and his prostatic length is 3.9 cm. The bony arch is open. Dr. Isabel Caprice measured a volume of 38 cc. I prescribing 14,500 cGy utilizing I-125 seeds. He'll be implanted with Nucletron seed Selectron system.

## 2012-09-05 NOTE — Progress Notes (Signed)
Please see the Nurse Progress Note in the MD Initial Consult Encounter for this patient. 

## 2012-09-11 ENCOUNTER — Telehealth: Payer: Self-pay | Admitting: *Deleted

## 2012-09-11 ENCOUNTER — Other Ambulatory Visit: Payer: Self-pay | Admitting: Urology

## 2012-09-11 NOTE — Telephone Encounter (Signed)
Called patient to inform of implant, lvm for a return call 

## 2012-09-12 ENCOUNTER — Telehealth: Payer: Self-pay | Admitting: *Deleted

## 2012-09-12 NOTE — Telephone Encounter (Signed)
CALLED PATIENT TO GIVE INFO THAT I HAVE RECEIVED HIS EKG AND CHEST X-RAY RESULTS, SPOKE WITH HIS FIANCEE AND THEY AWARE OF THIS INFO.

## 2012-10-03 ENCOUNTER — Emergency Department (HOSPITAL_COMMUNITY)
Admission: EM | Admit: 2012-10-03 | Discharge: 2012-10-03 | Disposition: A | Discharge status: Home / Self Care | Payer: Managed Care, Other (non HMO) | Attending: Emergency Medicine | Admitting: Emergency Medicine

## 2012-10-03 ENCOUNTER — Emergency Department (HOSPITAL_COMMUNITY): Payer: Managed Care, Other (non HMO)

## 2012-10-03 ENCOUNTER — Encounter (HOSPITAL_COMMUNITY): Payer: Self-pay

## 2012-10-03 DIAGNOSIS — R509 Fever, unspecified: Secondary | ICD-10-CM | POA: Insufficient documentation

## 2012-10-03 DIAGNOSIS — Z8546 Personal history of malignant neoplasm of prostate: Secondary | ICD-10-CM | POA: Insufficient documentation

## 2012-10-03 DIAGNOSIS — J40 Bronchitis, not specified as acute or chronic: Secondary | ICD-10-CM

## 2012-10-03 DIAGNOSIS — Z8739 Personal history of other diseases of the musculoskeletal system and connective tissue: Secondary | ICD-10-CM | POA: Insufficient documentation

## 2012-10-03 DIAGNOSIS — Z79899 Other long term (current) drug therapy: Secondary | ICD-10-CM | POA: Insufficient documentation

## 2012-10-03 DIAGNOSIS — Z8719 Personal history of other diseases of the digestive system: Secondary | ICD-10-CM | POA: Insufficient documentation

## 2012-10-03 DIAGNOSIS — F17200 Nicotine dependence, unspecified, uncomplicated: Secondary | ICD-10-CM | POA: Insufficient documentation

## 2012-10-03 DIAGNOSIS — R072 Precordial pain: Secondary | ICD-10-CM | POA: Insufficient documentation

## 2012-10-03 DIAGNOSIS — J3489 Other specified disorders of nose and nasal sinuses: Secondary | ICD-10-CM | POA: Insufficient documentation

## 2012-10-03 DIAGNOSIS — J029 Acute pharyngitis, unspecified: Secondary | ICD-10-CM | POA: Insufficient documentation

## 2012-10-03 DIAGNOSIS — E119 Type 2 diabetes mellitus without complications: Secondary | ICD-10-CM | POA: Insufficient documentation

## 2012-10-03 DIAGNOSIS — J209 Acute bronchitis, unspecified: Secondary | ICD-10-CM | POA: Insufficient documentation

## 2012-10-03 DIAGNOSIS — E785 Hyperlipidemia, unspecified: Secondary | ICD-10-CM | POA: Insufficient documentation

## 2012-10-03 DIAGNOSIS — Z87448 Personal history of other diseases of urinary system: Secondary | ICD-10-CM | POA: Insufficient documentation

## 2012-10-03 DIAGNOSIS — I10 Essential (primary) hypertension: Secondary | ICD-10-CM | POA: Insufficient documentation

## 2012-10-03 DIAGNOSIS — Z8709 Personal history of other diseases of the respiratory system: Secondary | ICD-10-CM | POA: Insufficient documentation

## 2012-10-03 MED ORDER — BENZONATATE 100 MG PO CAPS: 100.0000 mg | ORAL_CAPSULE | Freq: Three times a day (TID) | ORAL | Status: DC

## 2012-10-03 MED ORDER — DOXYCYCLINE HYCLATE 100 MG PO CAPS: 100.0000 mg | ORAL_CAPSULE | Freq: Two times a day (BID) | ORAL | Status: DC

## 2012-10-03 NOTE — ED Notes (Signed)
Pt reports generalized body aches, fever, and cough since Saturday.  Pt says chest hurts with cough.  Pt has productive cough with yellowish colored sputum.

## 2012-10-03 NOTE — ED Provider Notes (Signed)
History  This chart was scribed for [name], MD by Bennett Scrape, ED Scribe. This patient was seen in room APA01/APA01 and the patient's care was started at 1:00 PM.  CSN: [id number]  Arrival date & time 10/03/12  1142   First MD Initiated Contact with Patient 10/03/12 1300      Chief Complaint  Patient presents with  . Cough  . Generalized Body Aches    Patient is a 57 y.o. male presenting with chest pain. The history is provided by the patient. No language interpreter was used.  Chest Pain Pain location:  Substernal area Pain quality: aching   Pain radiates to:  Does not radiate Pain radiates to the back: no   Onset quality:  Gradual Duration:  4 days Timing:  Constant Progression:  Worsening Chronicity:  New Worsened by:  Coughing Ineffective treatments:  None tried Associated symptoms: cough and fever   Associated symptoms: no abdominal pain, no back pain, no nausea, no shortness of breath and not vomiting     Brandon Elliott is a 57 y.o. male who presents to the Emergency Department complaining of 4 days of gradual onset, gradually worsening, constant sternal CP described as achy with associated productive cough of yellow sputum, subjective fevers, mild sore throat with coughing and congestion.The pain is worse with coughing. Wife reports wheezing at night. Pt's wife states that she had similar symptoms 2 days ago that has improved with cough medication. He denies trying any medications for this episode stating that he tried an inhaler once for a prior episode with no improvement. He denies rhinorrhea,shortness of breath, nausea, emesis and diarrhea as associated symptoms. Pt is a current 0.5ppd smoker and occasional alcohol user. He has a h/o HTN and DM and has been taking his daily medications as prescribed. He is unsure what his CBG levels have been.  Pt states he has been prescribed an inhaler in the past but didn't feel it helped.   PCP is Paulene Floor with [facility]  Past Medical History  Diagnosis Date  . Hypertension   . Enlarged prostate   . Prostate cancer 06/29/12    Adenocarcinoma,gleason=3+4=7,& 3+3=6,PSA=4.23,volume=38cc  . Hyperlipidemia   . Back pain   . Joint pain   . BPH (benign prostatic hypertrophy) with urinary obstruction   . Sinusitis   . Diabetes mellitus     type II  . Ulcer 2010    hx gastric    History reviewed. No pertinent past surgical history.  Family History  Problem Relation Age of Onset  . Stroke Mother   . Cancer Father     prostate    History  Substance Use Topics  . Smoking status: Current Every Day Smoker -- 1.00 packs/day for 30 years    Types: Cigarettes  . Smokeless tobacco: Never Used  . 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 Negative for chills.  HENT: Positive for congestion and sore throat. Negative for neck pain.   Respiratory: Positive for cough. Negative for shortness of breath.   Cardiovascular: Positive for chest pain.  Gastrointestinal: Negative for nausea, vomiting, abdominal pain and diarrhea.  Genitourinary: Negative for frequency and hematuria.  Musculoskeletal: Negative for back pain.  Neurological: Negative  for seizures.    Allergies  Review of patient's allergies indicates no known allergies.  Home Medications   Current Outpatient Rx  Name  Route  Sig  Dispense  Refill  . amLODipine (NORVASC) 5 MG tablet   Oral   Take 5 mg by mouth daily.         Marland Kitchen atorvastatin (LIPITOR) 40 MG tablet   Oral   Take 40 mg by mouth daily.         . fenofibrate (TRICOR) 145 MG tablet   Oral   Take 145 mg by mouth daily.         Marland Kitchen lisinopril-hydrochlorothiazide (PRINZIDE,ZESTORETIC) 20-12.5 MG per tablet   Oral   Take 1 tablet by mouth daily.          Marland Kitchen  Phenylephrine-Pheniramine-DM (THERAFLU COLD & COUGH PO)   Oral   Take 30 mLs by mouth every 4 (four) hours as needed (cough).         . sitaGLIPtan-metformin (JANUMET) 50-500 MG per tablet   Oral   Take 1 tablet by mouth 2 (two) times daily with a meal.           Triage Vitals: BP 125/76  Pulse 120  Temp(Src) 98.9 F (37.2 C) (Oral)  Resp 20  Ht 5\' 6"  (1.676 m)  Wt 180 lb (81.647 kg)  BMI 29.07 kg/m2  SpO2 97%  Vital signs normal except tachycardia   Physical Exam  Nursing note and vitals reviewed. Constitutional: He is oriented to person, place, and time. He appears well-developed and well-nourished.  Non-toxic appearance. He does not appear ill. No distress.  HENT:  Head: Normocephalic and atraumatic.  Right Ear: External ear normal.  Left Ear: External ear normal.  Nose: Nose normal. No mucosal edema or rhinorrhea.  Mouth/Throat: Oropharynx is clear and moist and mucous membranes are normal. No dental abscesses or edematous.  Eyes: Conjunctivae and EOM are normal. Pupils are equal, round, and reactive to light.  Neck: Normal range of motion and full passive range of motion without pain. Neck supple.  Cardiovascular: Normal rate, regular rhythm and normal heart sounds.  Exam reveals no gallop and no friction rub.   No murmur heard. Pulmonary/Chest: Effort normal and breath sounds normal. No respiratory distress. He has no wheezes. He has no rhonchi. He has no rales. He exhibits no tenderness and no crepitus.    Coughing up thick yellow mucus, area of chest pain noted  Abdominal: Soft. Normal appearance and bowel sounds are normal. He exhibits no distension. There is no tenderness. There is no rebound and no guarding.  Musculoskeletal: Normal range of motion. He exhibits no edema and no tenderness.  Moves all extremities well.   Neurological: He is alert and oriented to person, place, and time. He has normal strength. No cranial nerve deficit.  Skin: Skin is warm, dry  and intact. No rash noted. No erythema. No pallor.  Psychiatric: He has a normal mood and affect. His speech is normal and behavior is normal. His mood appears not anxious.    ED Course  Procedures (including critical care time)    DIAGNOSTIC STUDIES: Oxygen Saturation is 97% on room air, adequate by my interpretation.    COORDINATION OF CARE: 1:13 PM-Discussed smoking cessation with pt. Informed pt of radiology results. Will order POCT CBG. Discussed discharge plan which includes ibuprofen or motrin, mucinex DM, antibiotic and cough medication with pt and pt agreed to plan. Also advised pt to follow up and pt  agreed. Will provide a work note.  Dg Chest 2 View  10/03/2012  *RADIOLOGY REPORT*  Clinical Data: Cough and body aches  CHEST - 2 VIEW  Comparison: 09/23/2011  Findings: Negative for pneumonia.  Lungs are clear.  Negative for heart failure or effusion.  IMPRESSION: No acute abnormality.   Original Report Authenticated By: Janeece Riggers, M.D.      1. Bronchitis     Discharge Medication List as of 10/03/2012  1:17 PM    START taking these medications   Details  benzonatate (TESSALON) 100 MG capsule Take 1 capsule (100 mg total) by mouth every 8 (eight) hours., Starting 10/03/2012, Until Discontinued, Print    doxycycline (VIBRAMYCIN) 100 MG capsule Take 1 capsule (100 mg total) by mouth 2 (two) times daily., Starting 10/03/2012, Until Discontinued, Print        Plan discharge  Devoria Albe, MD, FACEP   MDM    I personally performed the services described in this documentation, which was scribed in my presence. The recorded information has been reviewed and considered.  Devoria Albe, MD, Brandon Elliott    [name], MD 10/03/12 [phone]

## 2012-10-03 NOTE — ED Notes (Signed)
Pt c/o cough, congestion since Saturday. Pt states cough is productive with yellow-green sputum. Pt also reports SOB "at times".

## 2012-10-06 ENCOUNTER — Telehealth: Payer: Self-pay | Admitting: *Deleted

## 2012-10-06 NOTE — Telephone Encounter (Signed)
Called patient to remind of appt., lvm for a return call 

## 2012-10-09 LAB — COMPREHENSIVE METABOLIC PANEL
AST: 17 U/L (ref 0–37)
Albumin: 3.2 g/dL — ABNORMAL LOW (ref 3.5–5.2)
Calcium: 9.6 mg/dL (ref 8.4–10.5)
Creatinine, Ser: 1.03 mg/dL (ref 0.50–1.35)
Total Protein: 7.8 g/dL (ref 6.0–8.3)

## 2012-10-09 LAB — CBC
MCH: 30.5 pg (ref 26.0–34.0)
MCHC: 33.7 g/dL (ref 30.0–36.0)
MCV: 90.6 fL (ref 78.0–100.0)
Platelets: 309 10*3/uL (ref 150–400)
RDW: 12.3 % (ref 11.5–15.5)
WBC: 7.6 10*3/uL (ref 4.0–10.5)

## 2012-10-09 LAB — APTT: aPTT: 29 seconds (ref 24–37)

## 2012-10-09 LAB — PROTIME-INR: INR: 1.01 (ref 0.00–1.49)

## 2012-10-12 ENCOUNTER — Other Ambulatory Visit (HOSPITAL_COMMUNITY): Payer: Self-pay | Admitting: Nurse Practitioner

## 2012-10-12 ENCOUNTER — Encounter (HOSPITAL_BASED_OUTPATIENT_CLINIC_OR_DEPARTMENT_OTHER): Payer: Self-pay | Admitting: *Deleted

## 2012-10-13 ENCOUNTER — Telehealth: Payer: Self-pay | Admitting: *Deleted

## 2012-10-13 ENCOUNTER — Encounter (HOSPITAL_BASED_OUTPATIENT_CLINIC_OR_DEPARTMENT_OTHER): Payer: Self-pay | Admitting: *Deleted

## 2012-10-13 NOTE — H&P (Signed)
Brandon Elliott has chosen to proceed with prostate seed implantation for management of his intermediate risk prostate cancer. His prostate cancer history and recent physical exam are listed below. We will plan on outpatient procedure.   History of Present Illness         Brandon Elliott returns for an extended prostate cancer discussion. 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 Elliott was noted mildly elevated PSA of 4.2 with a reduced PSA 2  reading as well as a family history of prostate cancer. Digital rectal exam showed nothing of concern. Ultrasound revealed a proximally a 38 g prostate. Unfortunately we did find adenocarcinoma of Brandon prostate. On Brandon left side 5/6 biopsies were positive. All biopsy showed Gleason's 3+3 equals 6 cancer with core involvement from 5-30%. On Brandon right side a single biopsy was positive for a Gleason's 3+4 equals 7 cancer at Brandon base. This biopsy showed 40% involvement. 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 Brandon Elliott has had no complications or problems from Brandon biopsy. AUA score 15. SHIM 22/25   Past Medical History Problems  1. History of  Diabetes Mellitus 250.00 2. History of  Gastric Ulcer 531.90 3. History of  Hyperlipidemia 272.4 4. History of  Hypertension 401.9  Surgical History Problems  1. History of  No Surgical Problems  Current Meds 1. AmLODIPine Besylate 5 MG Oral Tablet; Therapy: (Recorded:15Nov2013) to 2. Atorvastatin Calcium TABS; Therapy: (Recorded:15Nov2013) to 3. Fenofibrate TABS; Therapy: (Recorded:15Nov2013) to 4. Janumet TABS; Therapy: (Recorded:15Nov2013) to 5. Lisinopril-Hydrochlorothiazide 20-12.5 MG Oral Tablet; Therapy: (Recorded:15Nov2013) to  Allergies Medication  1. No Known Drug Allergies  Family History Problems  1. Paternal history of  Death In Brandon Family Father died at age 42 from prostate cancer 2. Maternal history of  Death In Brandon Family Mother died at age 5  from stroke 3. Paternal history of  Prostate Cancer V16.42  Social History Problems  1. Alcohol Use 5 beers per day 2. Caffeine Use 2 per day 3. Marital History - Divorced V61.03 4. Occupation: truck Research scientist (physical sciences). Tobacco Use 305.1 1 ppd for 30 yrs  Review of Systems Genitourinary, constitutional, skin, eye, otolaryngeal, hematologic/lymphatic, cardiovascular, pulmonary, endocrine, musculoskeletal, gastrointestinal, neurological and psychiatric system(s) were reviewed and pertinent findings if present are noted.  Genitourinary: urinary frequency, dysuria and nocturia.  Musculoskeletal: back pain and joint pain.    Physical Exam Constitutional: Well nourished and well developed . No acute distress.  ENT:. Brandon ears and nose are normal in appearance.  Neck: Brandon appearance of Brandon neck is normal and no neck mass is present.  Pulmonary: No respiratory distress and normal respiratory rhythm and effort.  Cardiovascular: Heart rate and rhythm are normal . No peripheral edema.  Abdomen: Brandon abdomen is soft and nontender. No masses are palpated. No CVA tenderness. No hernias are palpable. No hepatosplenomegaly noted.  Rectal: Rectal exam demonstrates normal sphincter tone, no tenderness and no masses. Brandon prostate has no nodularity, is indurated involving Brandon entire right lobe  of Brandon prostate which appears to be confined within Brandon prostate capsule and is not tender. Brandon left seminal vesicle is nonpalpable. Brandon right seminal vesicle is nonpalpable. Brandon perineum is normal on inspection.  Genitourinary: Examination of Brandon penis demonstrates no discharge, no masses, no lesions and a normal meatus. Brandon penis is uncircumcised. Brandon scrotum is without lesions. Brandon right epididymis is palpably normal and non-tender. Brandon left epididymis is palpably normal and non-tender. Brandon right testis is non-tender  and without masses. Brandon left testis is non-tender and without masses.  Lymphatics: Brandon femoral and inguinal  nodes are not enlarged or tender.  Skin: Normal skin turgor, no visible rash and no visible skin lesions.  Neuro/Psych:. Mood and affect are appropriate.     Assessment Assessed  1. Prostate Cancer 185  Plan Prostate Cancer (185)  1. Radiation Oncology Referral Referral  Referral  Requested for: 30Jan2014  Discussion/Summary  Brandon Elliott was counseled about Brandon natural history of prostate cancer and Brandon standard treatment options that are available for prostate cancer. It was explained to him how his age and life expectancy, clinical stage, Gleason score, and PSA affect his prognosis, Brandon decision to proceed with additional staging studies, as well as how that information influences recommended treatment strategies. We discussed Brandon roles for active surveillance, radiation therapy, surgical therapy, androgen deprivation, as well as ablative therapy options for Brandon treatment of prostate cancer as appropriate to his individual cancer situation. We discussed Brandon risks and benefits of these options with regard to their impact on cancer control and also in terms of potential adverse events, complications, and impact on quiality of life particularly related to urinary, bowel, and sexual function. Brandon Elliott was encouraged to ask questions throughout Brandon discussion today and all questions were answered to his stated satisfaction. In addition, Brandon Elliott was provided with and/or directed to appropriate resources and literature for further education about prostate cancer and treatment options.   We discussed surgical therapy for prostate cancer including Brandon different available surgical approaches. We discussed, in detail, Brandon risks and expectations of surgery with regard to cancer control, urinary control, and erectile function as well as Brandon expected postoperative recovery process. Brandon risks, potential complications/adverse events of radical prostatectomy as well as alternative options were explained to  Brandon Elliott.   We discussed surgical therapy for prostate cancer including Brandon different available surgical approaches. We discussed, in detail, Brandon risks and expectations of surgery with regard to cancer control, urinary control, and erectile function as well as Brandon expected postoperative recovery process. Additional risks of surgery including but not limited to bleeding, infection, hernia formation, nerve damage, lymphocele formation, bowel/rectal injury potentially necessitating colostomy, damage to Brandon urinary tract resulting in urine leakage, urethral stricture, and Brandon cardiopulmonary risks such as myocardial infarction, stroke, death, venothromboembolism, etc. were explained. Brandon risk of open surgical conversion for robotic/laparoscopic prostatectomy was also discussed.   50 minutes were spent in face to face consultation with Elliott today.    Amendment     Brandon Elliott remains undecided as to how he would like to proceed. He is interested in a surgical approach but also wants to investigate radiation options. He does live close to [facility]. He is a borderline candidate for seed implantation alone and we both would be interested in Dr. Rennie Plowman opinion as to seeds as a mono therapy versus external beam plus seed boost. 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 We will discuss things with him further after he has a chance to have a consultation with Dr. Chipper Herb.

## 2012-10-13 NOTE — Telephone Encounter (Signed)
Called patient to remind of procedure for Monday March 31, lvm for a return call

## 2012-10-13 NOTE — Progress Notes (Signed)
UNABLE TO REACH PT, HE IS WORKING. SPOKE W/ PT GIRLFRIEND, Brandon Elliott. NPO AFTER MN. ARRIVES AT 0615. CURRENT LAB WORK, EKG, AND CXR IN EPIC AND CHART. WILL TAKE NORVASC AND LIPITOR AM OF SURG W/ SIP OF WATER AND DO FLEET ENEMA.

## 2012-10-16 ENCOUNTER — Encounter (HOSPITAL_BASED_OUTPATIENT_CLINIC_OR_DEPARTMENT_OTHER)
Admission: RE | Disposition: A | Discharge status: Home / Self Care | Payer: Self-pay | Source: Ambulatory Visit | Attending: Urology

## 2012-10-16 ENCOUNTER — Ambulatory Visit (HOSPITAL_BASED_OUTPATIENT_CLINIC_OR_DEPARTMENT_OTHER)
Admission: RE | Admit: 2012-10-16 | Discharge: 2012-10-16 | Disposition: A | Discharge status: Home / Self Care | Payer: Managed Care, Other (non HMO) | Source: Ambulatory Visit | Attending: Urology | Admitting: Urology

## 2012-10-16 ENCOUNTER — Encounter (HOSPITAL_BASED_OUTPATIENT_CLINIC_OR_DEPARTMENT_OTHER): Payer: Self-pay | Admitting: Anesthesiology

## 2012-10-16 ENCOUNTER — Encounter (HOSPITAL_BASED_OUTPATIENT_CLINIC_OR_DEPARTMENT_OTHER): Payer: Self-pay | Admitting: *Deleted

## 2012-10-16 ENCOUNTER — Ambulatory Visit (HOSPITAL_COMMUNITY): Payer: Managed Care, Other (non HMO)

## 2012-10-16 ENCOUNTER — Ambulatory Visit (HOSPITAL_BASED_OUTPATIENT_CLINIC_OR_DEPARTMENT_OTHER): Payer: Managed Care, Other (non HMO) | Admitting: Anesthesiology

## 2012-10-16 ENCOUNTER — Encounter: Payer: Self-pay | Admitting: Radiation Oncology

## 2012-10-16 DIAGNOSIS — Z8711 Personal history of peptic ulcer disease: Secondary | ICD-10-CM | POA: Insufficient documentation

## 2012-10-16 DIAGNOSIS — C61 Malignant neoplasm of prostate: Secondary | ICD-10-CM | POA: Insufficient documentation

## 2012-10-16 DIAGNOSIS — Z79899 Other long term (current) drug therapy: Secondary | ICD-10-CM | POA: Insufficient documentation

## 2012-10-16 DIAGNOSIS — I10 Essential (primary) hypertension: Secondary | ICD-10-CM | POA: Insufficient documentation

## 2012-10-16 DIAGNOSIS — F17200 Nicotine dependence, unspecified, uncomplicated: Secondary | ICD-10-CM | POA: Insufficient documentation

## 2012-10-16 DIAGNOSIS — E785 Hyperlipidemia, unspecified: Secondary | ICD-10-CM | POA: Insufficient documentation

## 2012-10-16 DIAGNOSIS — Z8042 Family history of malignant neoplasm of prostate: Secondary | ICD-10-CM | POA: Insufficient documentation

## 2012-10-16 DIAGNOSIS — E119 Type 2 diabetes mellitus without complications: Secondary | ICD-10-CM | POA: Insufficient documentation

## 2012-10-16 DIAGNOSIS — Z823 Family history of stroke: Secondary | ICD-10-CM | POA: Insufficient documentation

## 2012-10-16 HISTORY — DX: Personal history of other diseases of the digestive system: Z87.19

## 2012-10-16 HISTORY — DX: Type 2 diabetes mellitus without complications: E11.9

## 2012-10-16 HISTORY — DX: Personal history of other diseases of male genital organs: Z87.438

## 2012-10-16 HISTORY — PX: RADIOACTIVE SEED IMPLANT: SHX5150

## 2012-10-16 HISTORY — DX: Bronchitis, not specified as acute or chronic: J40

## 2012-10-16 LAB — GLUCOSE, CAPILLARY: Glucose-Capillary: 213 mg/dL — ABNORMAL HIGH (ref 70–99)

## 2012-10-16 SURGERY — INSERTION, RADIATION SOURCE, PROSTATE
Anesthesia: General | Site: Prostate | Wound class: Clean Contaminated

## 2012-10-16 MED ORDER — ROCURONIUM BROMIDE 100 MG/10ML IV SOLN
INTRAVENOUS | Status: DC | PRN
  Administered 2012-10-16: 40 mg via INTRAVENOUS
  Administered 2012-10-16: 10 mg via INTRAVENOUS

## 2012-10-16 MED ORDER — LIDOCAINE HCL 2 % EX GEL
CUTANEOUS | Status: DC | PRN
  Administered 2012-10-16: 1

## 2012-10-16 MED ORDER — OXYCODONE HCL 5 MG PO TABS
5.0000 mg | ORAL_TABLET | Freq: Once | ORAL | Status: DC | PRN
  Filled 2012-10-16: qty 1

## 2012-10-16 MED ORDER — CIPROFLOXACIN IN D5W 400 MG/200ML IV SOLN
400.0000 mg | INTRAVENOUS | Status: AC
  Administered 2012-10-16: 400 mg via INTRAVENOUS
  Filled 2012-10-16: qty 200

## 2012-10-16 MED ORDER — IOHEXOL 350 MG/ML SOLN
INTRAVENOUS | Status: DC | PRN
  Administered 2012-10-16: 3 mL

## 2012-10-16 MED ORDER — FENTANYL CITRATE 0.05 MG/ML IJ SOLN
INTRAMUSCULAR | Status: DC | PRN
  Administered 2012-10-16 (×3): 25 ug via INTRAVENOUS
  Administered 2012-10-16: 50 ug via INTRAVENOUS
  Administered 2012-10-16: 25 ug via INTRAVENOUS
  Administered 2012-10-16: 50 ug via INTRAVENOUS
  Administered 2012-10-16 (×4): 25 ug via INTRAVENOUS

## 2012-10-16 MED ORDER — LACTATED RINGERS IV SOLN
INTRAVENOUS | Status: DC
  Administered 2012-10-16: 100 mL/h via INTRAVENOUS
  Filled 2012-10-16: qty 1000

## 2012-10-16 MED ORDER — LACTATED RINGERS IV SOLN
INTRAVENOUS | Status: DC | PRN
  Administered 2012-10-16 (×2): via INTRAVENOUS

## 2012-10-16 MED ORDER — KETOROLAC TROMETHAMINE 30 MG/ML IJ SOLN
INTRAMUSCULAR | Status: DC | PRN
  Administered 2012-10-16: 30 mg via INTRAVENOUS

## 2012-10-16 MED ORDER — NEOSTIGMINE METHYLSULFATE 1 MG/ML IJ SOLN
INTRAMUSCULAR | Status: DC | PRN
  Administered 2012-10-16: 40 mg via INTRAVENOUS

## 2012-10-16 MED ORDER — FLEET ENEMA 7-19 GM/118ML RE ENEM
1.0000 | ENEMA | Freq: Once | RECTAL | Status: DC
  Filled 2012-10-16: qty 1

## 2012-10-16 MED ORDER — ONDANSETRON HCL 4 MG/2ML IJ SOLN
INTRAMUSCULAR | Status: DC | PRN
  Administered 2012-10-16: 4 mg via INTRAVENOUS

## 2012-10-16 MED ORDER — CIPROFLOXACIN HCL 500 MG PO TABS: 500.0000 mg | ORAL_TABLET | Freq: Two times a day (BID) | ORAL | Status: DC

## 2012-10-16 MED ORDER — LIDOCAINE HCL (CARDIAC) 20 MG/ML IV SOLN
INTRAVENOUS | Status: DC | PRN
  Administered 2012-10-16: 100 mg via INTRAVENOUS

## 2012-10-16 MED ORDER — HYDROCODONE-ACETAMINOPHEN 5-325 MG PO TABS: 1.0000 | ORAL_TABLET | Freq: Four times a day (QID) | ORAL | Status: DC | PRN

## 2012-10-16 MED ORDER — OXYCODONE HCL 5 MG/5ML PO SOLN
5.0000 mg | Freq: Once | ORAL | Status: DC | PRN
  Filled 2012-10-16: qty 5

## 2012-10-16 MED ORDER — MIDAZOLAM HCL 5 MG/5ML IJ SOLN
INTRAMUSCULAR | Status: DC | PRN
  Administered 2012-10-16: 2 mg via INTRAVENOUS

## 2012-10-16 MED ORDER — GLYCOPYRROLATE 0.2 MG/ML IJ SOLN
INTRAMUSCULAR | Status: DC | PRN
  Administered 2012-10-16: 0.4 mg via INTRAVENOUS

## 2012-10-16 MED ORDER — MEPERIDINE HCL 25 MG/ML IJ SOLN
6.2500 mg | INTRAMUSCULAR | Status: DC | PRN
  Filled 2012-10-16: qty 1

## 2012-10-16 MED ORDER — HYDROCODONE-ACETAMINOPHEN 5-325 MG PO TABS
1.0000 | ORAL_TABLET | Freq: Four times a day (QID) | ORAL | Status: DC | PRN
  Administered 2012-10-16: 1 via ORAL
  Filled 2012-10-16: qty 1

## 2012-10-16 MED ORDER — HYDROMORPHONE HCL PF 1 MG/ML IJ SOLN
0.2500 mg | INTRAMUSCULAR | Status: DC | PRN
  Administered 2012-10-16: 0.5 mg via INTRAVENOUS
  Filled 2012-10-16: qty 1

## 2012-10-16 MED ORDER — ACETAMINOPHEN 10 MG/ML IV SOLN
1000.0000 mg | Freq: Once | INTRAVENOUS | Status: DC | PRN
  Filled 2012-10-16: qty 100

## 2012-10-16 MED ORDER — PROMETHAZINE HCL 25 MG/ML IJ SOLN
6.2500 mg | INTRAMUSCULAR | Status: DC | PRN
  Filled 2012-10-16: qty 1

## 2012-10-16 MED ORDER — STERILE WATER FOR IRRIGATION IR SOLN
Status: DC | PRN
  Administered 2012-10-16: 3000 mL via INTRAVESICAL

## 2012-10-16 MED ORDER — PROPOFOL 10 MG/ML IV BOLUS
INTRAVENOUS | Status: DC | PRN
  Administered 2012-10-16: 200 mg via INTRAVENOUS

## 2012-10-16 SURGICAL SUPPLY — 25 items
BAG URINE DRAINAGE (UROLOGICAL SUPPLIES) ×2 IMPLANT
BLADE SURG ROTATE 9660 (MISCELLANEOUS) ×2 IMPLANT
CATH FOLEY 2WAY SLVR  5CC 16FR (CATHETERS) ×2
CATH FOLEY 2WAY SLVR 5CC 16FR (CATHETERS) ×2 IMPLANT
CATH ROBINSON RED A/P 20FR (CATHETERS) ×2 IMPLANT
CLOTH BEACON ORANGE TIMEOUT ST (SAFETY) ×2 IMPLANT
COVER MAYO STAND STRL (DRAPES) ×2 IMPLANT
COVER TABLE BACK 60X90 (DRAPES) ×2 IMPLANT
DRSG TEGADERM 4X4.75 (GAUZE/BANDAGES/DRESSINGS) ×2 IMPLANT
DRSG TEGADERM 8X12 (GAUZE/BANDAGES/DRESSINGS) ×2 IMPLANT
GAUZE SPONGE 4X4 12PLY STRL LF (GAUZE/BANDAGES/DRESSINGS) ×2 IMPLANT
GLOVE BIO SURGEON STRL SZ 6.5 (GLOVE) ×4 IMPLANT
GLOVE BIO SURGEON STRL SZ7 (GLOVE) ×2 IMPLANT
GLOVE BIO SURGEON STRL SZ7.5 (GLOVE) ×12 IMPLANT
GLOVE ECLIPSE 6.5 STRL STRAW (GLOVE) ×2 IMPLANT
GLOVE ECLIPSE 8.0 STRL XLNG CF (GLOVE) IMPLANT
GOWN STRL REIN XL XLG (GOWN DISPOSABLE) ×2 IMPLANT
GOWN SURGICAL LARGE (GOWNS) ×2 IMPLANT
HOLDER FOLEY CATH W/STRAP (MISCELLANEOUS) ×2 IMPLANT
PACK CYSTOSCOPY (CUSTOM PROCEDURE TRAY) ×2 IMPLANT
SYRINGE 10CC LL (SYRINGE) ×2 IMPLANT
UNDERPAD 30X30 INCONTINENT (UNDERPADS AND DIAPERS) ×4 IMPLANT
WATER STERILE IRR 3000ML UROMA (IV SOLUTION) ×2 IMPLANT
WATER STERILE IRR 500ML POUR (IV SOLUTION) ×2 IMPLANT
selectseed ×2 IMPLANT

## 2012-10-16 NOTE — Anesthesia Preprocedure Evaluation (Addendum)
Anesthesia Evaluation  Patient identified by MRN, date of birth, ID band Patient awake    Reviewed: Allergy & Precautions, H&P , NPO status , Patient's Chart, lab work & pertinent test results  Airway Mallampati: II TM Distance: >3 FB Neck ROM: Full    Dental  (+) Dental Advisory Given, Edentulous Upper, Poor Dentition, Loose, Missing and Partial Lower   Pulmonary Current Smoker,  breath sounds clear to auscultation        Cardiovascular hypertension, Pt. on medications Rhythm:Regular Rate:Normal     Neuro/Psych negative neurological ROS  negative psych ROS   GI/Hepatic negative GI ROS, Neg liver ROS,   Endo/Other  diabetes, Type 2, Oral Hypoglycemic Agents  Renal/GU negative Renal ROS     Musculoskeletal negative musculoskeletal ROS (+)   Abdominal   Peds  Hematology negative hematology ROS (+)   Anesthesia Other Findings   Reproductive/Obstetrics negative OB ROS                          Anesthesia Physical Anesthesia Plan  ASA: III  Anesthesia Plan: General   Post-op Pain Management:    Induction: Intravenous  Airway Management Planned: Oral ETT  Additional Equipment:   Intra-op Plan:   Post-operative Plan: Extubation in OR  Informed Consent: I have reviewed the patients History and Physical, chart, labs and discussed the procedure including the risks, benefits and alternatives for the proposed anesthesia with the patient or authorized representative who has indicated his/her understanding and acceptance.   Dental advisory given  Plan Discussed with: CRNA  Anesthesia Plan Comments:        Anesthesia Quick Evaluation

## 2012-10-16 NOTE — Interval H&P Note (Signed)
History and Physical Interval Note:  10/16/2012 7:31 AM  Brandon Elliott  has presented today for surgery, with the diagnosis of PROSTATE CANCER  The various methods of treatment have been discussed with the patient and family. After consideration of risks, benefits and other options for treatment, the patient has consented to  Procedure(s) with comments: RADIOACTIVE SEED IMPLANT (N/A) -   W/ [name]  as a surgical intervention .  The patient's history has been reviewed, patient examined, no change in status, stable for surgery.  I have reviewed the patient's chart and labs.  Questions were answered to the patient's satisfaction.     Brandon Elliott S

## 2012-10-16 NOTE — Progress Notes (Signed)
CC: Dr. Barron Alvine, Paulene Floor FNP  End of treatment summary:  Diagnosis: Stage TI C. intermediate risk adenocarcinoma prostate  Intent: Curative  Implant date: 10/17/2038  Site/dose: Prostate 14,500 cGy,  isotope I-125 utilizing 69 seeds and 22 active needles. Individual seed activity 0.41 millicuries per seed for a total implant activity of 28.2 mCi.  Narrative: The patient appears to have undergone a successful Nucletron seed Selectron implant with Dr. Isabel Caprice.  Plan: The patient will return to see [place] in approximately 3 weeks. We will obtain a CT scan at that time to assess the quality of his implant.

## 2012-10-16 NOTE — Anesthesia Procedure Notes (Signed)
Procedure Name: Intubation Date/Time: 10/16/2012 7:41 AM Performed by: Jessica Priest Pre-anesthesia Checklist: Patient identified, Emergency Drugs available, Suction available and Patient being monitored Patient Re-evaluated:Patient Re-evaluated prior to inductionOxygen Delivery Method: Circle System Utilized Preoxygenation: Pre-oxygenation with 100% oxygen Intubation Type: IV induction Ventilation: Mask ventilation without difficulty Laryngoscope Size: Mac and 3 Grade View: Grade I Tube type: Oral Tube size: 7.0 mm Number of attempts: 1 Airway Equipment and Method: stylet and oral airway Placement Confirmation: ETT inserted through vocal cords under direct vision,  positive ETCO2 and breath sounds checked- equal and bilateral Tube secured with: Tape Dental Injury: Teeth and Oropharynx as per pre-operative assessment

## 2012-10-16 NOTE — Transfer of Care (Signed)
Immediate Anesthesia Transfer of Care Note  Patient: Brandon Elliott  Procedure(s) Performed: Procedure(s) (LRB): RADIOACTIVE SEED IMPLANT (N/A)  Patient Location: PACU  Anesthesia Type: General  Level of Consciousness: awake, sedated, patient cooperative and responds to stimulation  Airway & Oxygen Therapy: Patient Spontanous Breathing and Patient connected to face mask oxygen  Post-op Assessment: Report given to PACU RN, Post -op Vital signs reviewed and stable and Patient moving all extremities  Post vital signs: Reviewed and stable  Complications: No apparent anesthesia complications

## 2012-10-16 NOTE — Anesthesia Postprocedure Evaluation (Signed)
Anesthesia Post Note  Patient: Brandon Elliott  Procedure(s) Performed: Procedure(s) (LRB): RADIOACTIVE SEED IMPLANT (N/A)  Anesthesia type: General  Patient location: PACU  Post pain: Pain level controlled  Post assessment: Post-op Vital signs reviewed  Last Vitals: BP 144/76  Pulse 69  Temp(Src) 36.5 C (Oral)  Resp 21  Ht 5\' 6"  (1.676 m)  Wt 192 lb (87.091 kg)  BMI 31 kg/m2  SpO2 95%  Post vital signs: Reviewed  Level of consciousness: sedated  Complications: No apparent anesthesia complications

## 2012-10-16 NOTE — Progress Notes (Signed)
[facility] Radiation Oncology Brachytherapy Operative Procedure Note  Name: Brandon Elliott MRN: [id number]  Date:   09/07/2012           DOB: [birth year]  Status:outpatient    WU:[id number],YNWG MARGARET, FNP   DIAGNOSIS: A 57 year old gentlemen with stage T1 C. adenocarcinoma of the prostate with a Gleason of 7 and a PSA of 4.23.  PROCEDURE: Insertion of radioactive I-125 seeds into the prostate gland.  RADIATION DOSE: 145 Gy, definitivetherapy.  TECHNIQUE: [name] was brought to the operating room with Dr. Isabel Caprice. He was placed in the dorsolithotomy position. He was catheterized and a rectal tube was inserted. The perineum was shaved, prepped and draped. The ultrasound probe was then introduced into the rectum to see the prostate gland.  TREATMENT DEVICE: A needle grid was attached to the ultrasound probe stand and anchor needles were placed.  COMPLEX ISODOSE CALCULATION: The prostate was imaged in 3D using a sagittal sweep of the prostate probe. These images were transferred to the planning computer. There, the prostate, urethra and rectum were defined on each axial reconstructed image. Then, the software created an optimized plan and a few seed positions were adjusted. Then the accepted plan was uploaded to the seed Selectron afterloading unit.  SPECIAL TREATMENT PROCEDURE/SUPERVISION AND HANDLING: The Nucletron FIRST system was used to place the needles under sagittal guidance. A total of 22 needles were used to deposit 69 seeds in the prostate gland. The individual seed activity was 0.41 mCi for a total implant activity of 28.2 mCi.  COMPLEX SIMULATION: At the end of the procedure, an anterior radiograph of the pelvis was obtained to document seed positioning and count. Cystoscopy was performed to check the urethra and bladder.  MICRODOSIMETRY: At the end of the procedure, the patient was emitting 0.1 mrem/hr at 1 meter. Accordingly, he was considered safe for hospital  discharge.  PLAN: The patient will return to the radiation oncology clinic for post implant CT dosimetry in three weeks.

## 2012-10-16 NOTE — Op Note (Signed)
Preoperative diagnosis: Clinical stage TI C adenocarcinoma the prostate  Postoperative diagnosis: Same  Procedure: I-125 prostate seed implantation with Nucletron robotic implanter  Surgeon: Valetta Fuller M.D. , Chipper Herb, M.D.  Anesthesia: Gen.  Indications: Patient  was diagnosed with clinical stage TI C prostate cancer. We had extensive discussion with him about treatment options versus. He elected to proceed with seed implantation. He underwent consultation my office as well as with Dr. Chipper Herb. He appeared to understand the advantages disadvantages potential risks of this treatment option. Full informed consent has been obtained. The patient is had preoperative ciprofloxacin. PAS compression boots were placed.  Technique and findings: Patient was brought the operating room where he had successful induction of general anesthesia. He was placed in lithotomy position and prepped and draped in usual manner. Appropriate surgical timeout was performed. Radiation oncology department placed a transrectal ultrasound probe anchoring stand. Foley catheter with contrast in the balloon was inserted without difficulty. Anchoring needles were placed within the prostate. Real-time contouring of the urethra prostate and rectum were performed and the dosing parameters were established. Targeted dose was 145 gray. We then came to the operating suite suite for placement of the needles. A second timeout was performed. All needle passage was done with real-time transrectal ultrasound guidance with the sagittal plane. A total of 22 needles were placed. See implantation itself was done with the robotic implanter. 69 active seeds were implanted. A Foley catheter was removed and flexible cystoscopy failed to show any seeds outside the prostate. The Foley catheter was inserted which drained clear urine. The patient was brought to recovery room in stable condition.

## 2012-10-17 ENCOUNTER — Encounter (HOSPITAL_BASED_OUTPATIENT_CLINIC_OR_DEPARTMENT_OTHER): Payer: Self-pay | Admitting: Urology

## 2012-11-03 ENCOUNTER — Telehealth: Payer: Self-pay | Admitting: *Deleted

## 2012-11-03 NOTE — Telephone Encounter (Signed)
CALLED PATIENT TO REMIND OF APPTS. FOR 11-06-12, SPOKE WITH PATIENT'S SIG. OTHER AND THEY ARE AWARE OF THESE APPTS.

## 2012-11-06 ENCOUNTER — Ambulatory Visit
Admission: RE | Admit: 2012-11-06 | Discharge: 2012-11-06 | Disposition: A | Discharge status: Home / Self Care | Payer: Managed Care, Other (non HMO) | Source: Ambulatory Visit | Attending: Radiation Oncology | Admitting: Radiation Oncology

## 2012-11-06 ENCOUNTER — Ambulatory Visit: Payer: Managed Care, Other (non HMO) | Admitting: Radiation Oncology

## 2012-11-06 VITALS — BP 126/86 | HR 98 | Temp 98.0°F | Ht 66.0 in | Wt 191.2 lb

## 2012-11-06 DIAGNOSIS — C61 Malignant neoplasm of prostate: Secondary | ICD-10-CM

## 2012-11-06 NOTE — Progress Notes (Signed)
CC: Dr. Barron Alvine, Paulene Floor FNP  Mr. Brandon Elliott visits today approximately 3 weeks following his prostate seed implant with Dr. Isabel Caprice in the management of his stage TI C. intermediate risk adenocarcinoma prostate he does report slight dysuria on occasion. He also has slight urinary frequency and urgency but this is not particular bothersome. He is sexually active and he does note rust colored semen. He does use a condom. He is to see Dr. Isabel Caprice this afternoon. No GI difficulties.  His CT scan from today shows what appears to be an excellent seed distribution.  Physical examination: Alert and oriented.  Filed Vitals:   11/06/12 1518  BP: 126/86  Pulse: 98  Temp: 98 F (36.7 C)   Rectal examination not performed today.  Impression: Satisfactory progress with mild radiation urethritis  Plan: He is to see Dr. Isabel Caprice today. I've not scheduled the patient for a formal followup visit and I ask that Dr. Isabel Caprice keep me posted on his progress. We'll move ahead with his post implant dosimetry and for the results Dr. Isabel Caprice.

## 2012-11-06 NOTE — Progress Notes (Signed)
Brandon Elliott here with his fiance for post seed follow up.  He denies pain but does have burning when urinating occasionally.  He also is reporting having to urinate more frequently after drinking anything.  He also has had bleeding with sexual intercourse.  He has noticed that his semen is red colored.

## 2012-11-06 NOTE — Progress Notes (Signed)
Complex simulation note: The patient was taken to the CT simulator. He was placed supine. His pelvis was scanned. There appears to be a favorable seed distribution. The CT data set was transferred to the [facility] system for contouring of his prostate and rectum. He'll then undergo 3-D simulation to assess the quality of his implant.

## 2012-11-14 ENCOUNTER — Other Ambulatory Visit: Payer: Self-pay | Admitting: Nurse Practitioner

## 2012-11-14 ENCOUNTER — Telehealth: Payer: Self-pay | Admitting: Nurse Practitioner

## 2012-11-14 NOTE — Telephone Encounter (Signed)
APPT MADE

## 2012-11-15 ENCOUNTER — Other Ambulatory Visit: Payer: Self-pay | Admitting: Nurse Practitioner

## 2012-11-15 ENCOUNTER — Telehealth: Payer: Self-pay | Admitting: Nurse Practitioner

## 2012-11-15 NOTE — Telephone Encounter (Signed)
LAST AIC 7.5 ON 10/13

## 2012-11-16 ENCOUNTER — Other Ambulatory Visit: Payer: Self-pay | Admitting: *Deleted

## 2012-11-16 MED ORDER — AMLODIPINE BESYLATE 5 MG PO TABS: 5.0000 mg | ORAL_TABLET | Freq: Every day | ORAL | Status: DC

## 2012-11-16 NOTE — Telephone Encounter (Signed)
LAST OV 10/13 

## 2012-12-06 ENCOUNTER — Ambulatory Visit
Admission: RE | Admit: 2012-12-06 | Discharge: 2012-12-06 | Disposition: A | Discharge status: Home / Self Care | Payer: Managed Care, Other (non HMO) | Source: Ambulatory Visit | Attending: Radiation Oncology | Admitting: Radiation Oncology

## 2012-12-06 ENCOUNTER — Encounter: Payer: Self-pay | Admitting: Radiation Oncology

## 2012-12-06 DIAGNOSIS — C61 Malignant neoplasm of prostate: Secondary | ICD-10-CM | POA: Insufficient documentation

## 2012-12-06 NOTE — Progress Notes (Signed)
CC: Dr. Barron Alvine, Paulene Floor FNP   Post implant CT dosimetry/3-D simulation note: Brandon Elliott underwent post-implant CT dosimetry/3-D simulation on 12/06/2012. His intraoperative prostate volume by ultrasound was 32.7 cc while his postoperative volume by CT was 31.2 cc, a good correlation. Dose volume histograms were obtained for the prostate and rectum. His prostate D 90 is 120.6% and V1 198.39%, both excellent. 0.08 cc of rectum received the prescribed dose of 14,500 cGy. In summary, the patient has excellent post implant dosimetry with a low-risk for late rectal toxicity.

## 2012-12-13 ENCOUNTER — Encounter: Payer: Self-pay | Admitting: Nurse Practitioner

## 2012-12-13 ENCOUNTER — Ambulatory Visit (INDEPENDENT_AMBULATORY_CARE_PROVIDER_SITE_OTHER): Payer: Commercial Indemnity | Admitting: Nurse Practitioner

## 2012-12-13 VITALS — BP 143/79 | HR 101 | Temp 97.4°F | Ht 67.0 in | Wt 193.0 lb

## 2012-12-13 DIAGNOSIS — I10 Essential (primary) hypertension: Secondary | ICD-10-CM

## 2012-12-13 DIAGNOSIS — E119 Type 2 diabetes mellitus without complications: Secondary | ICD-10-CM | POA: Insufficient documentation

## 2012-12-13 DIAGNOSIS — E785 Hyperlipidemia, unspecified: Secondary | ICD-10-CM | POA: Insufficient documentation

## 2012-12-13 DIAGNOSIS — E1159 Type 2 diabetes mellitus with other circulatory complications: Secondary | ICD-10-CM | POA: Insufficient documentation

## 2012-12-13 LAB — COMPLETE METABOLIC PANEL WITH GFR
AST: 18 U/L (ref 0–37)
Albumin: 4.5 g/dL (ref 3.5–5.2)
Alkaline Phosphatase: 46 U/L (ref 39–117)
Calcium: 10 mg/dL (ref 8.4–10.5)
Chloride: 101 mEq/L (ref 96–112)
Potassium: 4.9 mEq/L (ref 3.5–5.3)
Sodium: 138 mEq/L (ref 135–145)
Total Protein: 7.2 g/dL (ref 6.0–8.3)

## 2012-12-13 LAB — GLUCOSE, POCT (MANUAL RESULT ENTRY): POC Glucose: 235 mg/dl — AB (ref 70–99)

## 2012-12-13 MED ORDER — LISINOPRIL-HYDROCHLOROTHIAZIDE 20-25 MG PO TABS: 1.0000 | ORAL_TABLET | Freq: Every day | ORAL | Status: DC

## 2012-12-13 MED ORDER — ATORVASTATIN CALCIUM 40 MG PO TABS: 40.0000 mg | ORAL_TABLET | Freq: Every morning | ORAL | Status: DC

## 2012-12-13 MED ORDER — LISINOPRIL-HYDROCHLOROTHIAZIDE 20-25 MG PO TABS
2.0000 | ORAL_TABLET | Freq: Every day | ORAL | Status: DC
Start: 2012-12-13 — End: 2013-10-15

## 2012-12-13 MED ORDER — SITAGLIPTIN PHOS-METFORMIN HCL 50-1000 MG PO TABS: 1.0000 | ORAL_TABLET | Freq: Two times a day (BID) | ORAL | Status: DC

## 2012-12-13 NOTE — Progress Notes (Addendum)
Subjective:    Patient ID: Brandon Elliott, male    DOB: [birth year], 56 y.o.   MRN: [id number]  Diabetes He presents for his follow-up diabetic visit. He has type 2 diabetes mellitus. No MedicAlert identification noted. The initial diagnosis of diabetes was made 6 years ago. His disease course has been stable. There are no hypoglycemic associated symptoms. Pertinent negatives for hypoglycemia include no headaches. Pertinent negatives for diabetes include no chest pain, no foot paresthesias, no foot ulcerations, no visual change, no weakness and no weight loss. There are no hypoglycemic complications. Symptoms are stable. There are no diabetic complications. Risk factors for coronary artery disease include dyslipidemia, hypertension and male sex. Current diabetic treatment includes oral agent (dual therapy). He is compliant with treatment most of the time. His weight is stable. When asked about meal planning, he reported none. He has not had a previous visit with a dietician. He rarely participates in exercise. His home blood glucose trend is fluctuating minimally. His breakfast blood glucose is taken between 8-9 am. (PAtient hasn't been checking blood sugars at home) An ACE inhibitor/angiotensin II receptor blocker is being taken. He does not see a podiatrist.Eye exam is not current (never had eye exam- Patient encouraged to make appointment).  Hypertension This is a chronic problem. The current episode started more than 1 year ago. The problem is unchanged. The problem is uncontrolled. Pertinent negatives include no chest pain, headaches, palpitations or peripheral edema. There are no associated agents to hypertension. Risk factors for coronary artery disease include diabetes mellitus, dyslipidemia and male gender. Past treatments include ACE inhibitors and calcium channel blockers. The current treatment provides moderate improvement. Compliance problems include diet and exercise.   Hyperlipidemia This is a  chronic problem. The current episode started more than 1 year ago. The problem is uncontrolled. Recent lipid tests were reviewed and are high. Exacerbating diseases include diabetes. There are no known factors aggravating his hyperlipidemia. Pertinent negatives include no chest pain, focal weakness, leg pain or myalgias. Current antihyperlipidemic treatment includes statins and fibric acid derivatives (Patient has been out of statin for several weeks.). The current treatment provides moderate improvement of lipids. Compliance problems include adherence to diet and adherence to exercise.  Risk factors for coronary artery disease include diabetes mellitus, hypertension and male sex.      Review of Systems  Constitutional: Negative for weight loss.  Cardiovascular: Negative for chest pain and palpitations.  Musculoskeletal: Negative for myalgias.  Neurological: Negative for focal weakness, weakness and headaches.  All other systems reviewed and are negative.       Objective:   Physical Exam  Constitutional: He is oriented to person, place, and time. He appears well-developed and well-nourished.  HENT:  Head: Normocephalic.  Right Ear: External ear normal.  Left Ear: External ear normal.  Nose: Nose normal.  Mouth/Throat: Oropharynx is clear and moist.  Eyes: EOM are normal. Pupils are equal, round, and reactive to light.  Neck: Normal range of motion. Neck supple. No thyromegaly present.  Cardiovascular: Normal rate, regular rhythm, normal heart sounds and intact distal pulses.   No murmur heard. Pulmonary/Chest: Effort normal and breath sounds normal. He has no wheezes. He has no rales.  Abdominal: Soft. Bowel sounds are normal.  Musculoskeletal: Normal range of motion.  Neurological: He is alert and oriented to person, place, and time.  Skin: Skin is warm and dry.  Psychiatric: He has a normal mood and affect. His behavior is normal. Judgment and thought content normal.  BP 143/79   Pulse 101  Temp(Src) 97.4 F (36.3 C) (Oral)  Ht 5\' 7"  (1.702 m)  Wt 193 lb (87.544 kg)  BMI 30.22 kg/m2 Results for orders placed in visit on 12/13/12  POCT GLYCOSYLATED HEMOGLOBIN (HGB A1C)      Result Value Range   Hemoglobin A1C 9.9    GLUCOSE, POCT (MANUAL RESULT ENTRY)      Result Value Range   POC Glucose 235 (*) 70 - 99 mg/dl          Assessment & Plan:   1. HTN (hypertension)   2. Other and unspecified hyperlipidemia   3. Diabetes   4. Hypertension    Orders Placed This Encounter  Procedures  . COMPLETE METABOLIC PANEL WITH GFR  . NMR Lipoprofile with Lipids  . POCT glycosylated hemoglobin (Hb A1C)  . POCT glucose (manual entry)   Meds ordered this encounter  Medications  . sitaGLIPtan-metformin (JANUMET) 50-1000 MG per tablet    Sig: Take 1 tablet by mouth 2 (two) times daily with a meal.    Dispense:  60 tablet    Refill:  5    Order Specific Question:  Supervising Provider    Answer:  Ernestina Penna [[id number]]  . DISCONTD: lisinopril-hydrochlorothiazide (PRINZIDE,ZESTORETIC) 20-25 MG per tablet    Sig: Take 1 tablet by mouth daily.    Dispense:  90 tablet    Refill:  1    Order Specific Question:  Supervising Provider    Answer:  Ernestina Penna [[id number]]  . atorvastatin (LIPITOR) 40 MG tablet    Sig: Take 1 tablet (40 mg total) by mouth every morning.    Dispense:  30 tablet    Refill:  5    Order Specific Question:  Supervising Provider    Answer:  Ernestina Penna [[id number]]  . lisinopril-hydrochlorothiazide (PRINZIDE,ZESTORETIC) 20-25 MG per tablet    Sig: Take 2 tablets by mouth daily.    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

## 2012-12-13 NOTE — Addendum Note (Signed)
Addended by: Bennie Pierini on: 12/13/2012 11:06 AM   Modules accepted: Orders, Medications

## 2012-12-13 NOTE — Patient Instructions (Signed)

## 2012-12-15 LAB — NMR LIPOPROFILE WITH LIPIDS
Cholesterol, Total: 156 mg/dL (ref <200)
HDL Particle Number: 33.8 umol/L (ref >=30.5)
Large HDL-P: <1.3 umol/L — ABNORMAL LOW (ref >=4.8)
Large VLDL-P: <0.8 nmol/L (ref <=2.7)
Triglycerides: 138 mg/dL (ref <150)

## 2013-02-14 ENCOUNTER — Other Ambulatory Visit (INDEPENDENT_AMBULATORY_CARE_PROVIDER_SITE_OTHER): Payer: Commercial Indemnity

## 2013-02-14 DIAGNOSIS — E119 Type 2 diabetes mellitus without complications: Secondary | ICD-10-CM

## 2013-02-14 LAB — POCT GLYCOSYLATED HEMOGLOBIN (HGB A1C): Hemoglobin A1C: 7.6

## 2013-03-11 ENCOUNTER — Emergency Department (HOSPITAL_COMMUNITY): Payer: Managed Care, Other (non HMO)

## 2013-03-11 ENCOUNTER — Emergency Department (HOSPITAL_COMMUNITY)
Admission: EM | Admit: 2013-03-11 | Discharge: 2013-03-11 | Disposition: A | Discharge status: Home / Self Care | Payer: Managed Care, Other (non HMO) | Attending: Emergency Medicine | Admitting: Emergency Medicine

## 2013-03-11 ENCOUNTER — Encounter (HOSPITAL_COMMUNITY): Payer: Self-pay

## 2013-03-11 DIAGNOSIS — J3489 Other specified disorders of nose and nasal sinuses: Secondary | ICD-10-CM | POA: Insufficient documentation

## 2013-03-11 DIAGNOSIS — Z8546 Personal history of malignant neoplasm of prostate: Secondary | ICD-10-CM | POA: Insufficient documentation

## 2013-03-11 DIAGNOSIS — Z87448 Personal history of other diseases of urinary system: Secondary | ICD-10-CM | POA: Insufficient documentation

## 2013-03-11 DIAGNOSIS — Z8711 Personal history of peptic ulcer disease: Secondary | ICD-10-CM | POA: Insufficient documentation

## 2013-03-11 DIAGNOSIS — S022XXA Fracture of nasal bones, initial encounter for closed fracture: Secondary | ICD-10-CM | POA: Insufficient documentation

## 2013-03-11 DIAGNOSIS — E785 Hyperlipidemia, unspecified: Secondary | ICD-10-CM | POA: Insufficient documentation

## 2013-03-11 DIAGNOSIS — F17200 Nicotine dependence, unspecified, uncomplicated: Secondary | ICD-10-CM | POA: Insufficient documentation

## 2013-03-11 DIAGNOSIS — R0981 Nasal congestion: Secondary | ICD-10-CM

## 2013-03-11 DIAGNOSIS — E119 Type 2 diabetes mellitus without complications: Secondary | ICD-10-CM | POA: Insufficient documentation

## 2013-03-11 DIAGNOSIS — Z8709 Personal history of other diseases of the respiratory system: Secondary | ICD-10-CM | POA: Insufficient documentation

## 2013-03-11 DIAGNOSIS — Z79899 Other long term (current) drug therapy: Secondary | ICD-10-CM | POA: Insufficient documentation

## 2013-03-11 DIAGNOSIS — I10 Essential (primary) hypertension: Secondary | ICD-10-CM | POA: Insufficient documentation

## 2013-03-11 DIAGNOSIS — R040 Epistaxis: Secondary | ICD-10-CM | POA: Insufficient documentation

## 2013-03-11 MED ORDER — FLUTICASONE PROPIONATE 50 MCG/ACT NA SUSP: NASAL | Status: DC
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
## 2013-03-11 NOTE — ED Notes (Signed)
Raynelle Fanning PA at bedside speaking with pt and family

## 2013-03-11 NOTE — ED Provider Notes (Signed)
Medical screening examination/treatment/procedure(s) were performed by non-physician practitioner and as supervising physician I was immediately available for consultation/collaboration. Devoria Albe, MD, FACEP   [name], MD 03/11/13 [phone]
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
## 2013-03-22 ENCOUNTER — Other Ambulatory Visit: Payer: Self-pay

## 2013-03-22 DIAGNOSIS — E119 Type 2 diabetes mellitus without complications: Secondary | ICD-10-CM

## 2013-03-22 MED ORDER — SITAGLIPTIN PHOS-METFORMIN HCL 50-1000 MG PO TABS: 1.0000 | ORAL_TABLET | Freq: Two times a day (BID) | ORAL | Status: DC

## 2013-05-21 ENCOUNTER — Ambulatory Visit (INDEPENDENT_AMBULATORY_CARE_PROVIDER_SITE_OTHER): Payer: Managed Care, Other (non HMO) | Admitting: *Deleted

## 2013-05-21 DIAGNOSIS — Z23 Encounter for immunization: Secondary | ICD-10-CM

## 2013-07-30 ENCOUNTER — Other Ambulatory Visit: Payer: Self-pay

## 2013-07-30 DIAGNOSIS — E785 Hyperlipidemia, unspecified: Secondary | ICD-10-CM

## 2013-07-30 MED ORDER — ATORVASTATIN CALCIUM 40 MG PO TABS: 40.0000 mg | ORAL_TABLET | Freq: Every morning | ORAL | Status: DC

## 2013-07-30 NOTE — Telephone Encounter (Signed)
Last seen 12/13/12

## 2013-07-30 NOTE — Telephone Encounter (Signed)
ntbs- rx sent to pharmacy

## 2013-09-02 ENCOUNTER — Other Ambulatory Visit: Payer: Self-pay | Admitting: Nurse Practitioner

## 2013-09-04 NOTE — Telephone Encounter (Signed)
ntbs

## 2013-09-04 NOTE — Telephone Encounter (Signed)
Last labs 12/13/12

## 2013-10-08 ENCOUNTER — Other Ambulatory Visit: Payer: Self-pay | Admitting: Nurse Practitioner

## 2013-10-13 ENCOUNTER — Other Ambulatory Visit: Payer: Self-pay | Admitting: Nurse Practitioner

## 2013-10-15 ENCOUNTER — Other Ambulatory Visit: Payer: Self-pay | Admitting: *Deleted

## 2013-10-15 MED ORDER — LISINOPRIL-HYDROCHLOROTHIAZIDE 20-25 MG PO TABS: 2.0000 | ORAL_TABLET | Freq: Every day | ORAL | Status: DC

## 2013-10-15 NOTE — Telephone Encounter (Signed)
Patient last seen in office on 12-13-12. Was to return in 3 months but did not. Please advise

## 2013-10-15 NOTE — Telephone Encounter (Signed)
No refill until seen in office.

## 2013-10-29 ENCOUNTER — Other Ambulatory Visit: Payer: Self-pay | Admitting: Nurse Practitioner

## 2014-01-12 ENCOUNTER — Other Ambulatory Visit: Payer: Self-pay | Admitting: Nurse Practitioner

## 2014-01-14 NOTE — Telephone Encounter (Signed)
Patient last seen 5-14. Please advise

## 2014-01-21 ENCOUNTER — Other Ambulatory Visit: Payer: Self-pay

## 2014-01-21 MED ORDER — SITAGLIPTIN PHOS-METFORMIN HCL 50-1000 MG PO TABS: 1.0000 | ORAL_TABLET | Freq: Two times a day (BID) | ORAL | Status: DC

## 2014-01-21 NOTE — Telephone Encounter (Signed)
Last seen 12/13/12  Last glucose 02/14/13 MMM

## 2014-02-14 ENCOUNTER — Ambulatory Visit (INDEPENDENT_AMBULATORY_CARE_PROVIDER_SITE_OTHER): Payer: Managed Care, Other (non HMO) | Admitting: Nurse Practitioner

## 2014-02-14 ENCOUNTER — Encounter: Payer: Self-pay | Admitting: Nurse Practitioner

## 2014-02-14 VITALS — BP 160/98 | HR 87 | Temp 98.5°F | Ht 68.0 in | Wt 190.0 lb

## 2014-02-14 DIAGNOSIS — I10 Essential (primary) hypertension: Secondary | ICD-10-CM

## 2014-02-14 DIAGNOSIS — E785 Hyperlipidemia, unspecified: Secondary | ICD-10-CM

## 2014-02-14 DIAGNOSIS — E119 Type 2 diabetes mellitus without complications: Secondary | ICD-10-CM

## 2014-02-14 LAB — POCT GLYCOSYLATED HEMOGLOBIN (HGB A1C): HEMOGLOBIN A1C: 6.8

## 2014-02-14 MED ORDER — FENOFIBRATE 145 MG PO TABS: ORAL_TABLET | ORAL | Status: DC

## 2014-02-14 MED ORDER — ATORVASTATIN CALCIUM 40 MG PO TABS: ORAL_TABLET | ORAL | Status: DC

## 2014-02-14 NOTE — Progress Notes (Signed)
Subjective:    Patient ID: Brandon Elliott, male    DOB: [birth year], 58 y.o.   MRN: [id number]  Patient here today for follow up of chronic medical problems.  Has been out of Tricor and Lipitor for 3 months and needs refills. * son tried to commit suicide last week and patient is very stressed out which has made blood pressure go up a little.  Diabetes He presents for his follow-up diabetic visit. He has type 2 diabetes mellitus. No MedicAlert identification noted. The initial diagnosis of diabetes was made 6 years ago. His disease course has been stable. There are no hypoglycemic associated symptoms. Pertinent negatives for hypoglycemia include no headaches. Pertinent negatives for diabetes include no chest pain, no foot paresthesias, no foot ulcerations, no visual change, no weakness and no weight loss. There are no hypoglycemic complications. Symptoms are stable. There are no diabetic complications. Risk factors for coronary artery disease include dyslipidemia, hypertension and male sex. Current diabetic treatment includes oral agent (dual therapy). He is compliant with treatment most of the time. His weight is stable. When asked about meal planning, he reported none. He has not had a previous visit with a dietician. He rarely participates in exercise. His home blood glucose trend is fluctuating minimally. His breakfast blood glucose is taken between 8-9 am. (PAtient hasn't been checking blood sugars at home) An ACE inhibitor/angiotensin II receptor blocker is being taken. He does not see a podiatrist.Eye exam is not current (never had eye exam- Patient encouraged to make appointment).  Hypertension This is a chronic problem. The current episode started more than 1 year ago. The problem is unchanged. The problem is uncontrolled. Pertinent negatives include no chest pain, headaches, palpitations or peripheral edema. There are no associated agents to hypertension. Risk factors for coronary artery disease  include diabetes mellitus, dyslipidemia and male gender. Past treatments include ACE inhibitors and calcium channel blockers. The current treatment provides moderate improvement. Compliance problems include diet and exercise.   Hyperlipidemia This is a chronic problem. The current episode started more than 1 year ago. The problem is uncontrolled. Recent lipid tests were reviewed and are high. Exacerbating diseases include diabetes. There are no known factors aggravating his hyperlipidemia. Pertinent negatives include no chest pain, focal weakness, leg pain or myalgias. Current antihyperlipidemic treatment includes statins and fibric acid derivatives (Patient has been out of statin for several weeks.). The current treatment provides moderate improvement of lipids. Compliance problems include adherence to diet and adherence to exercise.  Risk factors for coronary artery disease include diabetes mellitus, hypertension and male sex.      Review of Systems  Constitutional: Negative for weight loss.  Cardiovascular: Negative for chest pain and palpitations.  Musculoskeletal: Negative for myalgias.  Neurological: Negative for focal weakness, weakness and headaches.  All other systems reviewed and are negative.      Objective:   Physical Exam  Constitutional: He is oriented to person, place, and time. He appears well-developed and well-nourished.  HENT:  Head: Normocephalic.  Right Ear: External ear normal.  Left Ear: External ear normal.  Nose: Nose normal.  Mouth/Throat: Oropharynx is clear and moist.  Eyes: EOM are normal. Pupils are equal, round, and reactive to light.  Neck: Normal range of motion. Neck supple. No thyromegaly present.  Cardiovascular: Normal rate, regular rhythm, normal heart sounds and intact distal pulses.   No murmur heard. Pulmonary/Chest: Effort normal and breath sounds normal. He has no wheezes. He has no rales.  Abdominal: Soft. Bowel  sounds are normal.   Musculoskeletal: Normal range of motion.  Neurological: He is alert and oriented to person, place, and time.  Skin: Skin is warm and dry.  Psychiatric: He has a normal mood and affect. His behavior is normal. Judgment and thought content normal.   BP 160/98  Pulse 87  Temp(Src) 98.5 F (36.9 C) (Oral)  Ht 5' 8" (1.727 m)  Wt 190 lb (86.183 kg)  BMI 28.90 kg/m2 Results for orders placed in visit on 02/14/14  POCT GLYCOSYLATED HEMOGLOBIN (HGB A1C)      Result Value Ref Range   Hemoglobin A1C 6.8            Assessment & Plan:   1. Essential hypertension   2. Hyperlipidemia   3. Type II or unspecified type diabetes mellitus without mention of complication, not stated as uncontrolled    Orders Placed This Encounter  Procedures  . CMP14+EGFR  . NMR, lipoprofile  . POCT glycosylated hemoglobin (Hb A1C)   Meds ordered this encounter  Medications  . atorvastatin (LIPITOR) 40 MG tablet    Sig: TAKE ONE TABLET BY MOUTH IN THE MORNING    Dispense:  30 tablet    Refill:  5    Order Specific Question:  Supervising Provider    Answer:  Chipper Herb [[id number]]  . fenofibrate (TRICOR) 145 MG tablet    Sig: TAKE ONE TABLET BY MOUTH EVERY DAY    Dispense:  30 tablet    Refill:  5    Order Specific Question:  Supervising Provider    Answer:  Joycelyn Man   Patien tto keep check of blood pressure if stays high he will let me know Labs pending Health maintenance reviewed Diet and exercise encouraged Continue all meds Follow up  In 3 months   [place], FNP

## 2014-02-14 NOTE — Patient Instructions (Signed)

## 2014-02-15 ENCOUNTER — Ambulatory Visit: Payer: Managed Care, Other (non HMO) | Admitting: Nurse Practitioner

## 2014-02-15 LAB — CMP14+EGFR
ALK PHOS: 68 IU/L (ref 39–117)
ALT: 38 IU/L (ref 0–44)
AST: 27 IU/L (ref 0–40)
Albumin/Globulin Ratio: 1.8 (ref 1.1–2.5)
Albumin: 4.4 g/dL (ref 3.6–4.8)
BUN / CREAT RATIO: 12 (ref 10–22)
BUN: 11 mg/dL (ref 8–27)
CALCIUM: 10 mg/dL (ref 8.6–10.2)
CHLORIDE: 96 mmol/L — AB (ref 97–108)
CO2: 29 mmol/L (ref 18–29)
Creatinine, Ser: 0.95 mg/dL (ref 0.76–1.27)
GFR calc Af Amer: 100 mL/min/{1.73_m2} (ref >59)
GFR calc non Af Amer: 87 mL/min/{1.73_m2} (ref >59)
Globulin, Total: 2.4 g/dL (ref 1.5–4.5)
Glucose: 144 mg/dL — ABNORMAL HIGH (ref 65–99)
Potassium: 4.7 mmol/L (ref 3.5–5.2)
SODIUM: 140 mmol/L (ref 134–144)
Total Bilirubin: 0.6 mg/dL (ref 0.0–1.2)
Total Protein: 6.8 g/dL (ref 6.0–8.5)

## 2014-02-15 LAB — NMR, LIPOPROFILE
Cholesterol: 152 mg/dL (ref 100–199)
HDL CHOLESTEROL BY NMR: 39 mg/dL — AB (ref >39)
HDL Particle Number: 33.4 umol/L (ref >=30.5)
LDL PARTICLE NUMBER: 1167 nmol/L — AB (ref <1000)
LDL Size: 19.9 nm (ref >20.5)
LDLC SERPL CALC-MCNC: 70 mg/dL (ref 0–99)
LP-IR SCORE: 79 — AB (ref <=45)
Small LDL Particle Number: 818 nmol/L — ABNORMAL HIGH (ref <=527)
Triglycerides by NMR: 215 mg/dL — ABNORMAL HIGH (ref 0–149)

## 2014-02-20 ENCOUNTER — Encounter: Payer: Self-pay | Admitting: Family Medicine

## 2014-03-05 ENCOUNTER — Other Ambulatory Visit: Payer: Self-pay | Admitting: Nurse Practitioner

## 2014-04-09 ENCOUNTER — Other Ambulatory Visit: Payer: Self-pay | Admitting: Nurse Practitioner

## 2014-05-20 ENCOUNTER — Ambulatory Visit (INDEPENDENT_AMBULATORY_CARE_PROVIDER_SITE_OTHER): Payer: Managed Care, Other (non HMO) | Admitting: Family Medicine

## 2014-05-20 ENCOUNTER — Encounter: Payer: Self-pay | Admitting: Nurse Practitioner

## 2014-05-20 ENCOUNTER — Ambulatory Visit (INDEPENDENT_AMBULATORY_CARE_PROVIDER_SITE_OTHER): Payer: Managed Care, Other (non HMO) | Admitting: Nurse Practitioner

## 2014-05-20 VITALS — BP 176/90 | HR 97 | Temp 97.1°F | Ht 68.0 in | Wt 197.4 lb

## 2014-05-20 DIAGNOSIS — E785 Hyperlipidemia, unspecified: Secondary | ICD-10-CM

## 2014-05-20 DIAGNOSIS — I10 Essential (primary) hypertension: Secondary | ICD-10-CM

## 2014-05-20 DIAGNOSIS — E119 Type 2 diabetes mellitus without complications: Secondary | ICD-10-CM | POA: Insufficient documentation

## 2014-05-20 DIAGNOSIS — Z23 Encounter for immunization: Secondary | ICD-10-CM

## 2014-05-20 LAB — POCT GLYCOSYLATED HEMOGLOBIN (HGB A1C): HEMOGLOBIN A1C: 7

## 2014-05-20 LAB — POCT UA - MICROALBUMIN: MICROALBUMIN (UR) POC: NEGATIVE mg/L

## 2014-05-20 MED ORDER — AMLODIPINE BESYLATE 5 MG PO TABS: 5.0000 mg | ORAL_TABLET | Freq: Every day | ORAL | Status: DC

## 2014-05-20 NOTE — Progress Notes (Signed)
  Subjective:    Patient ID: Brandon Elliott, male    DOB: 08/28/1955, 58 y.o.   MRN: [id number]  Patient here today for follow up of chronic medical problems.   Diabetes He presents for his follow-up diabetic visit. He has type 2 diabetes mellitus. Pertinent negatives for hypoglycemia include no headaches. Pertinent negatives for diabetes include no chest pain, no foot paresthesias, no visual change and no weakness. Pertinent negatives for hypoglycemia complications include no nocturnal hypoglycemia. Risk factors for coronary artery disease include male sex, hypertension, dyslipidemia and diabetes mellitus. He is compliant with treatment most of the time. He does not see a podiatrist.Eye exam is not current.  Hypertension This is a chronic problem. Pertinent negatives include no chest pain, headaches or palpitations. Risk factors for coronary artery disease include male gender, dyslipidemia, diabetes mellitus and smoking/tobacco exposure. Past treatments include diuretics.  Hyperlipidemia This is a chronic problem. The current episode started more than 1 year ago. The problem is controlled. Pertinent negatives include no chest pain or myalgias. Current antihyperlipidemic treatment includes statins. The current treatment provides mild improvement of lipids. Risk factors for coronary artery disease include diabetes mellitus, hypertension, male sex and dyslipidemia.      Review of Systems  Cardiovascular: Negative for chest pain and palpitations.  Musculoskeletal: Negative for myalgias.  Neurological: Negative for weakness and headaches.  All other systems reviewed and are negative.      Objective:   Physical Exam  Constitutional: He is oriented to person, place, and time. He appears well-developed and well-nourished.  HENT:  Head: Normocephalic and atraumatic.  Eyes: Conjunctivae are normal. Pupils are equal, round, and reactive to light.  Neck: Normal range of motion.  Cardiovascular:  Normal rate and regular rhythm.   Pulmonary/Chest: Effort normal and breath sounds normal.  Abdominal: Soft.  Musculoskeletal: Normal range of motion.  Neurological: He is alert and oriented to person, place, and time. He has normal reflexes.  Skin: Skin is warm and dry.  Psychiatric: He has a normal mood and affect. His behavior is normal. Judgment and thought content normal.   BP 176/90 mmHg  Pulse 97  Temp(Src) 97.1 F (36.2 C) (Oral)  Ht $R'5\' 8"'LZ$  (1.727 m)  Wt 197 lb 6.4 oz (89.54 kg)  BMI 30.02 kg/m2  Results for orders placed or performed in visit on 05/20/14  POCT glycosylated hemoglobin (Hb A1C)  Result Value Ref Range   Hemoglobin A1C 7.0%         Assessment & Plan:   1. Essential hypertension Low NA+ diet Add norvasc back to meds - NMR, lipoprofile  2. Hyperlipidemia Low fat diet - CMP14+EGFR  3. Type 2 diabetes mellitus without complication Watch carbs - POCT glycosylated hemoglobin (Hb A1C) - POCT UA - Microalbumin    Labs pending Health maintenance reviewed Diet and exercise encouraged Continue all meds Follow up  In 2 weeks  [place], FNP

## 2014-05-20 NOTE — Patient Instructions (Signed)

## 2014-05-21 LAB — CMP14+EGFR
ALBUMIN: 4.1 g/dL (ref 3.6–4.8)
ALT: 24 IU/L (ref 0–44)
AST: 18 IU/L (ref 0–40)
Albumin/Globulin Ratio: 1.7 (ref 1.1–2.5)
Alkaline Phosphatase: 49 IU/L (ref 39–117)
BUN/Creatinine Ratio: 12 (ref 10–22)
BUN: 13 mg/dL (ref 8–27)
CALCIUM: 9.2 mg/dL (ref 8.6–10.2)
CO2: 27 mmol/L (ref 18–29)
CREATININE: 1.12 mg/dL (ref 0.76–1.27)
Chloride: 98 mmol/L (ref 97–108)
GFR calc Af Amer: 82 mL/min/{1.73_m2} (ref >59)
GFR calc non Af Amer: 71 mL/min/{1.73_m2} (ref >59)
GLOBULIN, TOTAL: 2.4 g/dL (ref 1.5–4.5)
GLUCOSE: 192 mg/dL — AB (ref 65–99)
Potassium: 3.8 mmol/L (ref 3.5–5.2)
SODIUM: 138 mmol/L (ref 134–144)
TOTAL PROTEIN: 6.5 g/dL (ref 6.0–8.5)
Total Bilirubin: 0.4 mg/dL (ref 0.0–1.2)

## 2014-05-21 LAB — NMR, LIPOPROFILE
Cholesterol: 114 mg/dL (ref 100–199)
HDL CHOLESTEROL BY NMR: 38 mg/dL — AB (ref >39)
HDL PARTICLE NUMBER: 37.3 umol/L (ref >=30.5)
LDL Particle Number: 577 nmol/L (ref <1000)
LDL Size: 20 nm (ref >20.5)
LDL-C: 38 mg/dL (ref 0–99)
LP-IR Score: 80 — ABNORMAL HIGH (ref <=45)
Small LDL Particle Number: 394 nmol/L (ref <=527)
TRIGLYCERIDES BY NMR: 189 mg/dL — AB (ref 0–149)

## 2014-07-18 ENCOUNTER — Other Ambulatory Visit: Payer: Self-pay | Admitting: *Deleted

## 2014-07-18 NOTE — Telephone Encounter (Signed)
Pt requesting refill on Lisinopril/HCTZ 20/25, not on his current med list, can see where he has taken it in the past, at last visit in your plan and assessment you were adding Norvasc back to his medication management, couldn't see where we took him off this med but Epic won't let me approve this. If approved the rx will go electronically to [facility] in [place].

## 2014-07-19 ENCOUNTER — Other Ambulatory Visit: Payer: Self-pay | Admitting: Nurse Practitioner

## 2014-07-19 MED ORDER — LISINOPRIL-HYDROCHLOROTHIAZIDE 20-25 MG PO TABS: 2.0000 | ORAL_TABLET | Freq: Every day | ORAL | Status: DC

## 2014-07-23 ENCOUNTER — Encounter: Payer: Self-pay | Admitting: *Deleted

## 2014-07-23 NOTE — Progress Notes (Signed)
Letter mailed with all details.

## 2014-08-28 ENCOUNTER — Other Ambulatory Visit: Payer: Self-pay | Admitting: Nurse Practitioner

## 2014-09-03 ENCOUNTER — Other Ambulatory Visit: Payer: Self-pay | Admitting: Nurse Practitioner

## 2014-10-14 ENCOUNTER — Other Ambulatory Visit: Payer: Self-pay | Admitting: Nurse Practitioner

## 2014-11-20 ENCOUNTER — Other Ambulatory Visit: Payer: Self-pay | Admitting: Nurse Practitioner

## 2014-12-03 ENCOUNTER — Other Ambulatory Visit: Payer: Self-pay | Admitting: Family Medicine

## 2014-12-15 ENCOUNTER — Other Ambulatory Visit: Payer: Self-pay | Admitting: Nurse Practitioner

## 2014-12-17 NOTE — Telephone Encounter (Signed)
Patient NTBS for follow up and lab work no more refills without being seen

## 2014-12-18 NOTE — Telephone Encounter (Signed)
Left message refill is at pharmacy for 76mos but needs an appt before next refill

## 2014-12-20 ENCOUNTER — Encounter (HOSPITAL_COMMUNITY): Payer: Self-pay

## 2014-12-20 ENCOUNTER — Emergency Department (HOSPITAL_COMMUNITY)
Admission: EM | Admit: 2014-12-20 | Discharge: 2014-12-20 | Disposition: A | Discharge status: Home / Self Care | Payer: Managed Care, Other (non HMO) | Attending: Emergency Medicine | Admitting: Emergency Medicine

## 2014-12-20 ENCOUNTER — Emergency Department (HOSPITAL_COMMUNITY): Payer: Managed Care, Other (non HMO)

## 2014-12-20 DIAGNOSIS — Z8719 Personal history of other diseases of the digestive system: Secondary | ICD-10-CM | POA: Diagnosis not present

## 2014-12-20 DIAGNOSIS — Z720 Tobacco use: Secondary | ICD-10-CM | POA: Insufficient documentation

## 2014-12-20 DIAGNOSIS — E785 Hyperlipidemia, unspecified: Secondary | ICD-10-CM | POA: Diagnosis not present

## 2014-12-20 DIAGNOSIS — Z8546 Personal history of malignant neoplasm of prostate: Secondary | ICD-10-CM | POA: Diagnosis not present

## 2014-12-20 DIAGNOSIS — N390 Urinary tract infection, site not specified: Secondary | ICD-10-CM | POA: Insufficient documentation

## 2014-12-20 DIAGNOSIS — N508 Other specified disorders of male genital organs: Secondary | ICD-10-CM | POA: Diagnosis present

## 2014-12-20 DIAGNOSIS — N50811 Right testicular pain: Secondary | ICD-10-CM

## 2014-12-20 DIAGNOSIS — E119 Type 2 diabetes mellitus without complications: Secondary | ICD-10-CM | POA: Diagnosis not present

## 2014-12-20 DIAGNOSIS — Z7951 Long term (current) use of inhaled steroids: Secondary | ICD-10-CM | POA: Diagnosis not present

## 2014-12-20 DIAGNOSIS — I10 Essential (primary) hypertension: Secondary | ICD-10-CM | POA: Insufficient documentation

## 2014-12-20 DIAGNOSIS — Z79899 Other long term (current) drug therapy: Secondary | ICD-10-CM | POA: Insufficient documentation

## 2014-12-20 DIAGNOSIS — N451 Epididymitis: Secondary | ICD-10-CM | POA: Diagnosis not present

## 2014-12-20 LAB — CBC WITH DIFFERENTIAL/PLATELET
Basophils Absolute: 0 10*3/uL (ref 0.0–0.1)
Basophils Relative: 0 % (ref 0–1)
Eosinophils Absolute: 0.1 10*3/uL (ref 0.0–0.7)
Eosinophils Relative: 1 % (ref 0–5)
HEMATOCRIT: 41.2 % (ref 39.0–52.0)
Hemoglobin: 13.9 g/dL (ref 13.0–17.0)
LYMPHS ABS: 2.1 10*3/uL (ref 0.7–4.0)
Lymphocytes Relative: 29 % (ref 12–46)
MCH: 31.9 pg (ref 26.0–34.0)
MCHC: 33.7 g/dL (ref 30.0–36.0)
MCV: 94.5 fL (ref 78.0–100.0)
MONOS PCT: 8 % (ref 3–12)
Monocytes Absolute: 0.6 10*3/uL (ref 0.1–1.0)
Neutro Abs: 4.5 10*3/uL (ref 1.7–7.7)
Neutrophils Relative %: 62 % (ref 43–77)
Platelets: 197 10*3/uL (ref 150–400)
RBC: 4.36 MIL/uL (ref 4.22–5.81)
RDW: 13.5 % (ref 11.5–15.5)
WBC: 7.3 10*3/uL (ref 4.0–10.5)

## 2014-12-20 LAB — URINALYSIS, ROUTINE W REFLEX MICROSCOPIC
Bilirubin Urine: NEGATIVE
Glucose, UA: NEGATIVE mg/dL
Ketones, ur: NEGATIVE mg/dL
NITRITE: NEGATIVE
PROTEIN: NEGATIVE mg/dL
SPECIFIC GRAVITY, URINE: 1.025 (ref 1.005–1.030)
Urobilinogen, UA: 0.2 mg/dL (ref 0.0–1.0)
pH: 5.5 (ref 5.0–8.0)

## 2014-12-20 LAB — URINE MICROSCOPIC-ADD ON

## 2014-12-20 LAB — BASIC METABOLIC PANEL
Anion gap: 9 (ref 5–15)
BUN: 22 mg/dL — ABNORMAL HIGH (ref 6–20)
CHLORIDE: 103 mmol/L (ref 101–111)
CO2: 28 mmol/L (ref 22–32)
Calcium: 9.4 mg/dL (ref 8.9–10.3)
Creatinine, Ser: 1.03 mg/dL (ref 0.61–1.24)
GFR calc non Af Amer: >60 mL/min (ref >60)
GLUCOSE: 144 mg/dL — AB (ref 65–99)
Potassium: 3.6 mmol/L (ref 3.5–5.1)
Sodium: 140 mmol/L (ref 135–145)

## 2014-12-20 MED ORDER — CIPROFLOXACIN IN D5W 400 MG/200ML IV SOLN
400.0000 mg | Freq: Once | INTRAVENOUS | Status: AC
  Administered 2014-12-20: 400 mg via INTRAVENOUS
  Filled 2014-12-20: qty 200

## 2014-12-20 MED ORDER — FENTANYL CITRATE (PF) 100 MCG/2ML IJ SOLN
50.0000 ug | INTRAMUSCULAR | Status: DC | PRN
  Administered 2014-12-20 (×2): 50 ug via INTRAVENOUS
  Filled 2014-12-20 (×2): qty 2

## 2014-12-20 MED ORDER — CIPROFLOXACIN HCL 500 MG PO TABS: 500.0000 mg | ORAL_TABLET | Freq: Two times a day (BID) | ORAL | Status: DC

## 2014-12-20 MED ORDER — HYDROCODONE-ACETAMINOPHEN 5-325 MG PO TABS
1.0000 | ORAL_TABLET | ORAL | Status: DC | PRN
Start: 2014-12-20 — End: 2015-03-25

## 2014-12-20 MED ORDER — SODIUM CHLORIDE 0.9 % IV SOLN
INTRAVENOUS | Status: DC
  Administered 2014-12-20 (×2): via INTRAVENOUS

## 2014-12-20 NOTE — ED Notes (Signed)
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 States pain increased tonight per EMS.

## 2014-12-20 NOTE — ED Provider Notes (Signed)
CSN: [id number]     Arrival date & time 12/20/14  0524 History   First MD Initiated Contact with Patient 12/20/14 0536     Chief Complaint  Patient presents with  . Testicle Pain     (Consider location/radiation/quality/duration/timing/severity/associated sxs/prior Treatment) HPI  Patient states 2 days ago he started having pain in his right testicle that would come and go. He states he woke up this morning and the pain was worse. He denies any swelling. He states he has pain in his penis when he urinates. He denies having any hematuria. He denies nausea or vomiting. He denies any known trauma. He states he's never had this happen before.  PCP [facility] in [facility] Dr Risa Grill  Past Medical History  Diagnosis Date  . Hypertension   . Prostate cancer 06/29/12    Adenocarcinoma,gleason=3+4=7,& 3+3=6,PSA=4.23,volume=38cc  . Hyperlipidemia   . History of gastric ulcer 2010  . DM (diabetes mellitus), type 2   . History of BPH     W/ BOO  . Bronchitis     DX 10-03-2012--  ON ANTIBIOTICS   Past Surgical History  Procedure Laterality Date  . Radioactive seed implant N/A 10/16/2012    Procedure: RADIOACTIVE SEED IMPLANT;  Surgeon: Bernestine Amass, MD;  Location: [facility];  Service: Urology;  Laterality: N/A;  53 seeds implanted no seeds found in bladder    Family History  Problem Relation Age of Onset  . Stroke Mother   . Cancer Father     prostate   History  Substance Use Topics  . Smoking status: Current Every Day Smoker -- 0.50 packs/day for 30 years    Types: Cigarettes  . Smokeless tobacco: Never Used  . Alcohol Use: 21.0 oz/week    35 Cans of beer per week     Comment: 5-6 cans beer per day  employed Lives at home   Review of Systems  All other systems reviewed and are negative.     Allergies  Review of patient's allergies indicates no known allergies.  Home Medications   Prior to Admission medications   Medication Sig  Start Date End Date Taking? Authorizing Provider  amLODipine (NORVASC) 5 MG tablet TAKE ONE TABLET BY MOUTH ONCE DAILY 12/17/14  Yes Mary-Margaret Hassell Done, FNP  atorvastatin (LIPITOR) 40 MG tablet TAKE ONE TABLET BY MOUTH IN THE MORNING 12/17/14  Yes Mary-Margaret Hassell Done, FNP  fenofibrate (TRICOR) 145 MG tablet TAKE ONE TABLET BY MOUTH ONCE DAILY 12/03/14  Yes Mary-Margaret Hassell Done, FNP  fluticasone (FLONASE) 50 MCG/ACT nasal spray One spray each nostril twice daily 03/11/13  Yes Julie Idol, PA-C  JANUMET 50-1000 MG per tablet TAKE ONE TABLET BY MOUTH TWICE DAILY WITH A MEAL 08/29/14  Yes Mary-Margaret Hassell Done, FNP  lisinopril-hydrochlorothiazide (PRINZIDE,ZESTORETIC) 20-25 MG per tablet Take 2 tablets by mouth daily. 07/19/14  Yes Mary-Margaret Hassell Done, FNP  lisinopril-hydrochlorothiazide (PRINZIDE,ZESTORETIC) 20-25 MG per tablet TAKE TWO TABLETS BY MOUTH ONCE DAILY 10/15/14  Yes Mary-Margaret Hassell Done, FNP  tamsulosin (FLOMAX) 0.4 MG CAPS capsule  04/25/14  Yes Historical Provider, MD   BP 135/77 mmHg  Pulse 94  Temp(Src) 98.4 F (36.9 C) (Oral)  Resp 20  Ht 5\' 5"  (1.651 m)  Wt 195 lb (88.451 kg)  BMI 32.45 kg/m2  SpO2 97%  Vital signs normal   Physical Exam  Constitutional: He is oriented to person, place, and time. He appears well-developed and well-nourished.  Non-toxic appearance. He does not appear ill. No distress.  HENT:  Head:  Normocephalic and atraumatic.  Right Ear: External ear normal.  Left Ear: External ear normal.  Nose: Nose normal. No mucosal edema or rhinorrhea.  Mouth/Throat: Mucous membranes are normal. No dental abscesses or uvula swelling.  Eyes: Conjunctivae and EOM are normal. Pupils are equal, round, and reactive to light.  Neck: Normal range of motion and full passive range of motion without pain. Neck supple.  Pulmonary/Chest: No respiratory distress. He has no rhonchi. He exhibits no crepitus.  Abdominal: Soft. Normal appearance and bowel sounds are normal. He exhibits  no distension. There is no tenderness. There is no rebound and no guarding.  Genitourinary: Penis normal.  Patient's left testicle is normal size and nontender. His right testicle feels enlarged and is very tender. He has minor discomfort in the epididymis on the right.  Musculoskeletal: Normal range of motion. He exhibits no edema or tenderness.  Moves all extremities well.   Neurological: He is alert and oriented to person, place, and time. He has normal strength. No cranial nerve deficit.  Skin: Skin is warm, dry and intact. No rash noted. No erythema. No pallor.  Psychiatric: He has a normal mood and affect. His speech is normal and behavior is normal. His mood appears not anxious.  Nursing note and vitals reviewed.   ED Course  Procedures (including critical care time)  Medications  0.9 %  sodium chloride infusion ( Intravenous New Bag/Given 12/20/14 0624)  fentaNYL (SUBLIMAZE) injection 50 mcg (50 mcg Intravenous Given 12/20/14 0624)  ciprofloxacin (CIPRO) IVPB 400 mg (not administered)   Pt was given IV pain medications.   After reviewing his test results he was given IV cipro for his UTI. Pt had urine culture in 2013 with >100K colonies of E Coli sensitive to almost all antibiotics tested.   Pt left at change of shift with Dr Jeneen Rinks to get results of his ultrasound.    Labs Review Results for orders placed or performed during the hospital encounter of 12/20/14  Urinalysis, Routine w reflex microscopic  Result Value Ref Range   Color, Urine YELLOW YELLOW   APPearance CLEAR CLEAR   Specific Gravity, Urine 1.025 1.005 - 1.030   pH 5.5 5.0 - 8.0   Glucose, UA NEGATIVE NEGATIVE mg/dL   Hgb urine dipstick SMALL (A) NEGATIVE   Bilirubin Urine NEGATIVE NEGATIVE   Ketones, ur NEGATIVE NEGATIVE mg/dL   Protein, ur NEGATIVE NEGATIVE mg/dL   Urobilinogen, UA 0.2 0.0 - 1.0 mg/dL   Nitrite NEGATIVE NEGATIVE   Leukocytes, UA TRACE (A) NEGATIVE  Basic metabolic panel  Result Value Ref  Range   Sodium 140 135 - 145 mmol/L   Potassium 3.6 3.5 - 5.1 mmol/L   Chloride 103 101 - 111 mmol/L   CO2 28 22 - 32 mmol/L   Glucose, Bld 144 (H) 65 - 99 mg/dL   BUN 22 (H) 6 - 20 mg/dL   Creatinine, Ser 1.03 0.61 - 1.24 mg/dL   Calcium 9.4 8.9 - 10.3 mg/dL   GFR calc non Af Amer >60 >60 mL/min   GFR calc Af Amer >60 >60 mL/min   Anion gap 9 5 - 15  CBC with Differential  Result Value Ref Range   WBC 7.3 4.0 - 10.5 K/uL   RBC 4.36 4.22 - 5.81 MIL/uL   Hemoglobin 13.9 13.0 - 17.0 g/dL   HCT 41.2 39.0 - 52.0 %   MCV 94.5 78.0 - 100.0 fL   MCH 31.9 26.0 - 34.0 pg   MCHC 33.7 30.0 -  36.0 g/dL   RDW 13.5 11.5 - 15.5 %   Platelets 197 150 - 400 K/uL   Neutrophils Relative % 62 43 - 77 %   Neutro Abs 4.5 1.7 - 7.7 K/uL   Lymphocytes Relative 29 12 - 46 %   Lymphs Abs 2.1 0.7 - 4.0 K/uL   Monocytes Relative 8 3 - 12 %   Monocytes Absolute 0.6 0.1 - 1.0 K/uL   Eosinophils Relative 1 0 - 5 %   Eosinophils Absolute 0.1 0.0 - 0.7 K/uL   Basophils Relative 0 0 - 1 %   Basophils Absolute 0.0 0.0 - 0.1 K/uL  Urine microscopic-add on  Result Value Ref Range   WBC, UA TOO NUMEROUS TO COUNT <3 WBC/hpf   RBC / HPF 7-10 <3 RBC/hpf   Bacteria, UA MANY (A) RARE   Laboratory interpretation all normal except UTI     Imaging Review No results found.   EKG Interpretation None      MDM   Final diagnoses:  Pain in right testicle  Urinary tract infection without hematuria, site unspecified    Disposition pending  Rolland Porter, MD, Barbette Or, MD 12/20/14 [phone]

## 2014-12-20 NOTE — ED Notes (Signed)
US at bedside

## 2014-12-20 NOTE — ED Provider Notes (Signed)
Findings discussed with patient. He is asymptomatic and has normal exam including a intact cremasteric reflex to the left testicle. Discharged home. Treatment for his right epididymal orchitis. Cipro, Vicodin for pain. Recommend supportive shorts. Follow-up with Dr. Risa Grill, his urologist within one week.  Tanna Furry, MD 12/20/14 [phone]

## 2014-12-23 LAB — URINE CULTURE
Colony Count: >=100000
Special Requests: NORMAL

## 2014-12-24 ENCOUNTER — Telehealth (HOSPITAL_BASED_OUTPATIENT_CLINIC_OR_DEPARTMENT_OTHER): Payer: Self-pay | Admitting: Emergency Medicine

## 2014-12-24 NOTE — Telephone Encounter (Signed)
Post ED Visit - Positive Culture Follow-up  Culture report reviewed by antimicrobial stewardship pharmacist: []  Wes Dulaney, Pharm.D., BCPS [x]  Heide Guile, Pharm.D., BCPS []  Alycia Rossetti, Pharm.D., BCPS []  [place], Pharm.D., BCPS, AAHIVP []  Legrand Como, Pharm.D., BCPS, AAHIVP []  Isac Sarna, Pharm.D., BCPS  Positive urine culture Klebsiella Treated with ciprofloxacin, organism sensitive to the same and no further patient follow-up is required at this time.  [name] 12/24/2014, 11:31 AM

## 2014-12-30 ENCOUNTER — Other Ambulatory Visit: Payer: Self-pay | Admitting: Nurse Practitioner

## 2015-01-02 ENCOUNTER — Other Ambulatory Visit: Payer: Self-pay | Admitting: Nurse Practitioner

## 2015-01-14 ENCOUNTER — Other Ambulatory Visit: Payer: Self-pay | Admitting: Nurse Practitioner

## 2015-01-14 NOTE — Telephone Encounter (Signed)
Last seen 05/20/14  MMM

## 2015-01-14 NOTE — Telephone Encounter (Signed)
no more refills without being seen  

## 2015-01-14 NOTE — Telephone Encounter (Signed)
Last seen and last lipid  05/20/14 MMM

## 2015-02-10 ENCOUNTER — Other Ambulatory Visit: Payer: Self-pay | Admitting: Nurse Practitioner

## 2015-02-13 ENCOUNTER — Other Ambulatory Visit: Payer: Self-pay | Admitting: Nurse Practitioner

## 2015-02-22 ENCOUNTER — Other Ambulatory Visit: Payer: Self-pay | Admitting: Nurse Practitioner

## 2015-02-24 NOTE — Telephone Encounter (Signed)
no more refills without being seen  

## 2015-03-18 ENCOUNTER — Other Ambulatory Visit: Payer: Self-pay | Admitting: Nurse Practitioner

## 2015-03-18 NOTE — Telephone Encounter (Signed)
Last seen 06/07/14

## 2015-03-18 NOTE — Telephone Encounter (Signed)
no more refills without being seen  

## 2015-03-20 ENCOUNTER — Other Ambulatory Visit: Payer: Self-pay | Admitting: Nurse Practitioner

## 2015-03-20 NOTE — Telephone Encounter (Signed)
No lipitor refill until seen

## 2015-03-20 NOTE — Telephone Encounter (Signed)
Last labs 05/2014

## 2015-03-21 ENCOUNTER — Telehealth: Payer: Self-pay | Admitting: Nurse Practitioner

## 2015-03-21 NOTE — Telephone Encounter (Signed)
Blood pressure meds have already been sent in will refill cholesterol meds when here

## 2015-03-25 ENCOUNTER — Other Ambulatory Visit: Payer: Self-pay | Admitting: *Deleted

## 2015-03-25 ENCOUNTER — Ambulatory Visit (INDEPENDENT_AMBULATORY_CARE_PROVIDER_SITE_OTHER): Payer: Managed Care, Other (non HMO) | Admitting: Family Medicine

## 2015-03-25 ENCOUNTER — Encounter: Payer: Self-pay | Admitting: Family Medicine

## 2015-03-25 VITALS — BP 136/81 | HR 97 | Temp 97.4°F | Ht 65.0 in | Wt 195.0 lb

## 2015-03-25 DIAGNOSIS — E785 Hyperlipidemia, unspecified: Secondary | ICD-10-CM | POA: Diagnosis not present

## 2015-03-25 DIAGNOSIS — E119 Type 2 diabetes mellitus without complications: Secondary | ICD-10-CM

## 2015-03-25 DIAGNOSIS — I10 Essential (primary) hypertension: Secondary | ICD-10-CM | POA: Diagnosis not present

## 2015-03-25 LAB — POCT GLYCOSYLATED HEMOGLOBIN (HGB A1C): Hemoglobin A1C: 6.8

## 2015-03-25 MED ORDER — TAMSULOSIN HCL 0.4 MG PO CAPS: 0.4000 mg | ORAL_CAPSULE | Freq: Every day | ORAL | Status: DC

## 2015-03-25 MED ORDER — ATORVASTATIN CALCIUM 40 MG PO TABS: 40.0000 mg | ORAL_TABLET | Freq: Every morning | ORAL | Status: DC

## 2015-03-25 MED ORDER — AMLODIPINE BESYLATE 5 MG PO TABS: 5.0000 mg | ORAL_TABLET | Freq: Every day | ORAL | Status: DC

## 2015-03-25 MED ORDER — LISINOPRIL-HYDROCHLOROTHIAZIDE 20-25 MG PO TABS: 2.0000 | ORAL_TABLET | Freq: Every day | ORAL | Status: DC

## 2015-03-25 MED ORDER — FENOFIBRATE 145 MG PO TABS: ORAL_TABLET | ORAL | Status: DC

## 2015-03-25 NOTE — Addendum Note (Signed)
Addended by: Ilean China on: 03/25/2015 05:18 PM   Modules accepted: Orders

## 2015-03-25 NOTE — Progress Notes (Signed)
Subjective:  Patient ID: Brandon Elliott, male    DOB: 12-Mar-1956  Age: 60 y.o. MRN: [id number]  CC: Hypertension; Medication Refill; and Diabetes   HPI Brandon Elliott presents for  follow-up of hypertension. Patient has no history of headache chest pain or shortness of breath or recent cough. Patient also denies symptoms of TIA such as numbness weakness lateralizing. Patient checks  blood pressure at home and has not had any elevated readings recently. Patient denies side effects from his medication. States taking it regularly.  Patient also  in for follow-up of elevated cholesterol. Doing well without complaints on current medication. Denies side effects of statin including myalgia and arthralgia and nausea. Also in today for liver function testing. Currently no chest pain, shortness of breath or other cardiovascular related symptoms noted.  Follow-up of diabetes. Patient does not check blood sugar at home. Patient denies symptoms such as polyuria, polydipsia, excessive hunger, nausea No significant hypoglycemic spells noted. Medications as noted below. Taking them regularly without complication/adverse reaction being reported today.   Has seed implant currently active for prostate cancer. History Brandon Elliott has a past medical history of Hypertension; Prostate cancer (06/29/12); Hyperlipidemia; History of gastric ulcer (2010); DM (diabetes mellitus), type 2; History of BPH; and Bronchitis.   He has past surgical history that includes Radioactive seed implant (N/A, 10/16/2012).   His family history includes Cancer in his father; Stroke in his mother.He reports that he has been smoking Cigarettes.  He has a 15 pack-year smoking history. He has never used smokeless tobacco. He reports that he drinks about 21.0 oz of alcohol per week. He reports that he does not use illicit drugs.  Current Outpatient Prescriptions on File Prior to Visit  Medication Sig Dispense Refill  . JANUMET 50-1000 MG per  tablet TAKE ONE TABLET BY MOUTH TWICE DAILY WITH  A  MEAL 60 tablet 2   No current facility-administered medications on file prior to visit.    ROS Review of Systems  Constitutional: Negative for fever, chills and diaphoresis.  HENT: Negative for congestion, rhinorrhea and sore throat.   Respiratory: Negative for cough, shortness of breath and wheezing.   Cardiovascular: Negative for chest pain.  Gastrointestinal: Negative for nausea, vomiting, abdominal pain, diarrhea, constipation and abdominal distention.  Genitourinary: Negative for dysuria and frequency.  Musculoskeletal: Negative for joint swelling and arthralgias.  Skin: Negative for rash.  Neurological: Negative for headaches.    Objective:  BP 136/81 mmHg  Pulse 97  Temp(Src) 97.4 F (36.3 C) (Oral)  Ht 5' 5"  (1.651 m)  Wt 195 lb (88.451 kg)  BMI 32.45 kg/m2  BP Readings from Last 3 Encounters:  03/25/15 136/81  12/20/14 122/74  05/20/14 176/90    Wt Readings from Last 3 Encounters:  03/25/15 195 lb (88.451 kg)  12/20/14 195 lb (88.451 kg)  05/20/14 197 lb 6.4 oz (89.54 kg)     Physical Exam  Constitutional: He is oriented to person, place, and time. He appears well-developed and well-nourished. No distress.  HENT:  Head: Normocephalic and atraumatic.  Right Ear: External ear normal.  Left Ear: External ear normal.  Nose: Nose normal.  Mouth/Throat: Oropharynx is clear and moist.  Eyes: Conjunctivae and EOM are normal. Pupils are equal, round, and reactive to light.  Neck: Normal range of motion. Neck supple. No thyromegaly present.  Cardiovascular: Normal rate, regular rhythm and normal heart sounds.   No murmur heard. Pulmonary/Chest: Effort normal and breath sounds normal. No respiratory distress. He has  no wheezes. He has no rales.  Abdominal: Soft. Bowel sounds are normal. He exhibits no distension. There is no tenderness.  Lymphadenopathy:    He has no cervical adenopathy.  Neurological: He is  alert and oriented to person, place, and time. He has normal reflexes.  Skin: Skin is warm and dry.  Psychiatric: He has a normal mood and affect. His behavior is normal. Judgment and thought content normal.    Lab Results  Component Value Date   HGBA1C 6.8 03/25/2015   HGBA1C 7.0 05/20/2014   HGBA1C 6.8 02/14/2014    Lab Results  Component Value Date   WBC 7.3 12/20/2014   HGB 13.9 12/20/2014   HCT 41.2 12/20/2014   PLT 197 12/20/2014   GLUCOSE 144* 12/20/2014   CHOL 114 05/20/2014   TRIG 189* 05/20/2014   HDL 38* 05/20/2014   LDLCALC 70 02/14/2014   ALT 24 05/20/2014   AST 18 05/20/2014   NA 140 12/20/2014   K 3.6 12/20/2014   CL 103 12/20/2014   CREATININE 1.03 12/20/2014   BUN 22* 12/20/2014   CO2 28 12/20/2014   INR 1.01 10/09/2012   HGBA1C 6.8 03/25/2015    US Scrotum  12/20/2014   CLINICAL DATA:  RIGHT testicular pain and swelling for 1 day  EXAM: SCROTAL ULTRASOUND  DOPPLER ULTRASOUND OF THE TESTICLES  TECHNIQUE: Complete ultrasound examination of the testicles, epididymis, and other scrotal structures was performed. Color and spectral Doppler ultrasound were also utilized to evaluate blood flow to the testicles.  COMPARISON:  None.  FINDINGS: Right testicle  Measurements: 4.1 x 2.3 x 3.3 cm. Normal morphology without mass or calcification. Increased blood flow within RIGHT testis on color Doppler imaging.  Left testicle  Measurements: 3.8 x 1.9 x 2.5 cm. Smaller size versus RIGHT. Diffusely heterogeneous echogenicity. No discrete mass. Internal blood flow present on color Doppler imaging, diminished versus RIGHT and questionably diminished versus expected.  Right epididymis: Mildly prominent head and proximal body, associated with hyperemia.  Left epididymis:  Normal in size and appearance.  Hydrocele:  Small RIGHT hydrocele  Varicocele:  Absent bilaterally  Pulsed Doppler interrogation of both testes demonstrates normal low resistance arterial and venous waveforms  bilaterally.  IMPRESSION: Hypervascular RIGHT testis and epididymis compatible with epididymal orchitis.  No evidence of RIGHT testicular torsion or mass.  LEFT testis is smaller and is diffusely heterogeneous in echogenicity versus RIGHT though internal low resistance arterial and venous waveforms are identified.  The appearance of the LEFT testis could be due to prior torsion or infection, less likely due to a diffuse infiltrative process.  In the absence of acute symptoms involving the LEFT testis, acute torsion is considered less likely.  Has patient had a history of prior LEFT testicular symptoms/torsion or orchitis?  Followup ultrasound recommended in 3 months to reassess the LEFT testis.  Findings called to Dr. Jeneen Rinks on 12/20/2014 at 0831 hours.   Electronically Signed   By: Lavonia Dana M.D.   On: 12/20/2014 08:33   [place] Art/ven Flow Abd Pelv Doppler  12/20/2014   CLINICAL DATA:  RIGHT testicular pain and swelling for 1 day  EXAM: SCROTAL ULTRASOUND  DOPPLER ULTRASOUND OF THE TESTICLES  TECHNIQUE: Complete ultrasound examination of the testicles, epididymis, and other scrotal structures was performed. Color and spectral Doppler ultrasound were also utilized to evaluate blood flow to the testicles.  COMPARISON:  None.  FINDINGS: Right testicle  Measurements: 4.1 x 2.3 x 3.3 cm. Normal morphology without mass or calcification. Increased  blood flow within RIGHT testis on color Doppler imaging.  Left testicle  Measurements: 3.8 x 1.9 x 2.5 cm. Smaller size versus RIGHT. Diffusely heterogeneous echogenicity. No discrete mass. Internal blood flow present on color Doppler imaging, diminished versus RIGHT and questionably diminished versus expected.  Right epididymis: Mildly prominent head and proximal body, associated with hyperemia.  Left epididymis:  Normal in size and appearance.  Hydrocele:  Small RIGHT hydrocele  Varicocele:  Absent bilaterally  Pulsed Doppler interrogation of both testes demonstrates normal  low resistance arterial and venous waveforms bilaterally.  IMPRESSION: Hypervascular RIGHT testis and epididymis compatible with epididymal orchitis.  No evidence of RIGHT testicular torsion or mass.  LEFT testis is smaller and is diffusely heterogeneous in echogenicity versus RIGHT though internal low resistance arterial and venous waveforms are identified.  The appearance of the LEFT testis could be due to prior torsion or infection, less likely due to a diffuse infiltrative process.  In the absence of acute symptoms involving the LEFT testis, acute torsion is considered less likely.  Has patient had a history of prior LEFT testicular symptoms/torsion or orchitis?  Followup ultrasound recommended in 3 months to reassess the LEFT testis.  Findings called to Dr. Jeneen Rinks on 12/20/2014 at 0831 hours.   Electronically Signed   By: Lavonia Dana M.D.   On: 12/20/2014 08:33    Assessment & Plan:   Brandon Elliott was seen today for hypertension, medication refill and diabetes.  Diagnoses and all orders for this visit:  Type 2 diabetes mellitus without complication -     POCT glycosylated hemoglobin (Hb A1C) -     Ambulatory referral to Ophthalmology -     CMP14+EGFR -     Lipid panel -     CBC with Differential/Platelet  Essential hypertension -     CMP14+EGFR -     Lipid panel -     CBC with Differential/Platelet  Hyperlipidemia -     CMP14+EGFR -     Lipid panel -     CBC with Differential/Platelet  Other orders -     atorvastatin (LIPITOR) 40 MG tablet; Take 1 tablet (40 mg total) by mouth every morning. -     fenofibrate (TRICOR) 145 MG tablet; TAKE ONE TABLET BY MOUTH ONCE DAILY, NEED OFFICE VISIT -     amLODipine (NORVASC) 5 MG tablet; Take 1 tablet (5 mg total) by mouth daily. -     lisinopril-hydrochlorothiazide (PRINZIDE,ZESTORETIC) 20-25 MG per tablet; Take 2 tablets by mouth daily. -     tamsulosin (FLOMAX) 0.4 MG CAPS capsule; Take 1 capsule (0.4 mg total) by mouth daily.   I have  discontinued Mr. Hostler's fluticasone, ciprofloxacin, and HYDROcodone-acetaminophen. I have also changed his atorvastatin, amLODipine, lisinopril-hydrochlorothiazide, and tamsulosin. Additionally, I am having him maintain his JANUMET and fenofibrate.  Meds ordered this encounter  Medications  . atorvastatin (LIPITOR) 40 MG tablet    Sig: Take 1 tablet (40 mg total) by mouth every morning.    Dispense:  30 tablet    Refill:  5  . fenofibrate (TRICOR) 145 MG tablet    Sig: TAKE ONE TABLET BY MOUTH ONCE DAILY, NEED OFFICE VISIT    Dispense:  30 tablet    Refill:  5    Must be seen before next refill  . amLODipine (NORVASC) 5 MG tablet    Sig: Take 1 tablet (5 mg total) by mouth daily.    Dispense:  30 tablet    Refill:  5  .  lisinopril-hydrochlorothiazide (PRINZIDE,ZESTORETIC) 20-25 MG per tablet    Sig: Take 2 tablets by mouth daily.    Dispense:  180 tablet    Refill:  3  . tamsulosin (FLOMAX) 0.4 MG CAPS capsule    Sig: Take 1 capsule (0.4 mg total) by mouth daily.    Dispense:  30 capsule    Refill:  5.     Follow-up: Return in about 3 months (around 06/24/2015).  Claretta Fraise, M.D.

## 2015-03-26 LAB — CMP14+EGFR
A/G RATIO: 1.8 (ref 1.1–2.5)
ALBUMIN: 4.4 g/dL (ref 3.6–4.8)
ALK PHOS: 64 IU/L (ref 39–117)
ALT: 30 IU/L (ref 0–44)
AST: 25 IU/L (ref 0–40)
BILIRUBIN TOTAL: 0.3 mg/dL (ref 0.0–1.2)
BUN/Creatinine Ratio: 14 (ref 10–22)
BUN: 13 mg/dL (ref 8–27)
CO2: 24 mmol/L (ref 18–29)
CREATININE: 0.94 mg/dL (ref 0.76–1.27)
Calcium: 9.9 mg/dL (ref 8.6–10.2)
Chloride: 99 mmol/L (ref 97–108)
GFR calc Af Amer: 101 mL/min/{1.73_m2} (ref >59)
GFR calc non Af Amer: 87 mL/min/{1.73_m2} (ref >59)
Globulin, Total: 2.4 g/dL (ref 1.5–4.5)
Glucose: 95 mg/dL (ref 65–99)
POTASSIUM: 4.3 mmol/L (ref 3.5–5.2)
Sodium: 139 mmol/L (ref 134–144)
Total Protein: 6.8 g/dL (ref 6.0–8.5)

## 2015-03-26 LAB — CBC WITH DIFFERENTIAL/PLATELET
BASOS ABS: 0 10*3/uL (ref 0.0–0.2)
BASOS: 0 %
EOS (ABSOLUTE): 0.1 10*3/uL (ref 0.0–0.4)
Eos: 1 %
HEMOGLOBIN: 14.3 g/dL (ref 12.6–17.7)
Hematocrit: 43 % (ref 37.5–51.0)
IMMATURE GRANS (ABS): 0 10*3/uL (ref 0.0–0.1)
Immature Granulocytes: 0 %
LYMPHS: 49 %
Lymphocytes Absolute: 3.3 10*3/uL — ABNORMAL HIGH (ref 0.7–3.1)
MCH: 30.8 pg (ref 26.6–33.0)
MCHC: 33.3 g/dL (ref 31.5–35.7)
MCV: 93 fL (ref 79–97)
Monocytes Absolute: 0.4 10*3/uL (ref 0.1–0.9)
Monocytes: 6 %
NEUTROS ABS: 2.9 10*3/uL (ref 1.4–7.0)
NEUTROS PCT: 44 %
PLATELETS: 231 10*3/uL (ref 150–379)
RBC: 4.65 x10E6/uL (ref 4.14–5.80)
RDW: 14.7 % (ref 12.3–15.4)
WBC: 6.7 10*3/uL (ref 3.4–10.8)

## 2015-03-26 LAB — LIPID PANEL
CHOLESTEROL TOTAL: 123 mg/dL (ref 100–199)
Chol/HDL Ratio: 2.8 ratio units (ref 0.0–5.0)
HDL: 44 mg/dL (ref >39)
LDL Calculated: 12 mg/dL (ref 0–99)
TRIGLYCERIDES: 336 mg/dL — AB (ref 0–149)
VLDL Cholesterol Cal: 67 mg/dL — ABNORMAL HIGH (ref 5–40)

## 2015-04-20 ENCOUNTER — Other Ambulatory Visit: Payer: Self-pay | Admitting: Nurse Practitioner

## 2015-05-01 ENCOUNTER — Other Ambulatory Visit: Payer: Self-pay | Admitting: Family Medicine

## 2015-08-28 ENCOUNTER — Ambulatory Visit (INDEPENDENT_AMBULATORY_CARE_PROVIDER_SITE_OTHER): Payer: Managed Care, Other (non HMO) | Admitting: Nurse Practitioner

## 2015-08-28 ENCOUNTER — Encounter: Payer: Self-pay | Admitting: Nurse Practitioner

## 2015-08-28 VITALS — BP 141/85 | HR 92 | Temp 98.7°F | Ht 65.0 in | Wt 195.0 lb

## 2015-08-28 DIAGNOSIS — Z23 Encounter for immunization: Secondary | ICD-10-CM | POA: Diagnosis not present

## 2015-08-28 DIAGNOSIS — E785 Hyperlipidemia, unspecified: Secondary | ICD-10-CM

## 2015-08-28 DIAGNOSIS — I10 Essential (primary) hypertension: Secondary | ICD-10-CM | POA: Diagnosis not present

## 2015-08-28 DIAGNOSIS — Z6832 Body mass index (BMI) 32.0-32.9, adult: Secondary | ICD-10-CM | POA: Diagnosis not present

## 2015-08-28 DIAGNOSIS — Z1212 Encounter for screening for malignant neoplasm of rectum: Secondary | ICD-10-CM

## 2015-08-28 DIAGNOSIS — E119 Type 2 diabetes mellitus without complications: Secondary | ICD-10-CM | POA: Diagnosis not present

## 2015-08-28 DIAGNOSIS — C61 Malignant neoplasm of prostate: Secondary | ICD-10-CM

## 2015-08-28 DIAGNOSIS — Z1159 Encounter for screening for other viral diseases: Secondary | ICD-10-CM

## 2015-08-28 LAB — POCT GLYCOSYLATED HEMOGLOBIN (HGB A1C): Hemoglobin A1C: 7.1

## 2015-08-28 MED ORDER — LISINOPRIL-HYDROCHLOROTHIAZIDE 20-25 MG PO TABS: 2.0000 | ORAL_TABLET | Freq: Every day | ORAL | Status: DC

## 2015-08-28 MED ORDER — ATORVASTATIN CALCIUM 40 MG PO TABS: 40.0000 mg | ORAL_TABLET | Freq: Every morning | ORAL | Status: DC

## 2015-08-28 MED ORDER — TAMSULOSIN HCL 0.4 MG PO CAPS: 0.4000 mg | ORAL_CAPSULE | Freq: Every day | ORAL | Status: DC

## 2015-08-28 MED ORDER — AMLODIPINE BESYLATE 5 MG PO TABS: 5.0000 mg | ORAL_TABLET | Freq: Every day | ORAL | Status: DC

## 2015-08-28 MED ORDER — FENOFIBRATE 145 MG PO TABS: ORAL_TABLET | ORAL | Status: DC

## 2015-08-28 MED ORDER — SITAGLIPTIN PHOS-METFORMIN HCL 50-1000 MG PO TABS: ORAL_TABLET | ORAL | Status: DC

## 2015-08-28 NOTE — Addendum Note (Signed)
Addended by: Rolena Infante on: 08/28/2015 04:47 PM   Modules accepted: Orders

## 2015-08-28 NOTE — Progress Notes (Signed)
Subjective:    Patient ID: Brandon Elliott, male    DOB: 1955-11-25, 60 y.o.   MRN: [id number]  Patient here today for follow up of chronic medical problems. Patient not very compliant on meds.  Outpatient Encounter Prescriptions as of 08/28/2015  Medication Sig  . amLODipine (NORVASC) 5 MG tablet TAKE ONE TABLET BY MOUTH ONCE DAILY  . atorvastatin (LIPITOR) 40 MG tablet Take 1 tablet (40 mg total) by mouth every morning.  . fenofibrate (TRICOR) 145 MG tablet TAKE ONE TABLET BY MOUTH ONCE DAILY, NEED OFFICE VISIT  . JANUMET 50-1000 MG tablet TAKE ONE TABLET BY MOUTH TWICE DAILY WITH  A  MEAL  . lisinopril-hydrochlorothiazide (PRINZIDE,ZESTORETIC) 20-25 MG per tablet Take 2 tablets by mouth daily.  . tamsulosin (FLOMAX) 0.4 MG CAPS capsule Take 1 capsule (0.4 mg total) by mouth daily.  . [DISCONTINUED] amLODipine (NORVASC) 5 MG tablet Take 1 tablet (5 mg total) by mouth daily.   No facility-administered encounter medications on file as of 08/28/2015.     Diabetes He presents for his follow-up diabetic visit. He has type 2 diabetes mellitus. Pertinent negatives for hypoglycemia include no headaches. Pertinent negatives for diabetes include no chest pain, no foot paresthesias, no visual change and no weakness. Pertinent negatives for hypoglycemia complications include no nocturnal hypoglycemia. Risk factors for coronary artery disease include male sex, hypertension, dyslipidemia and diabetes mellitus. He is compliant with treatment most of the time. He does not see a podiatrist.Eye exam is not current.  Hypertension This is a chronic problem. Pertinent negatives include no chest pain, headaches or palpitations. Risk factors for coronary artery disease include male gender, dyslipidemia, diabetes mellitus and smoking/tobacco exposure. Past treatments include diuretics.  Hyperlipidemia This is a chronic problem. The current episode started more than 1 year ago. The problem is controlled. Pertinent  negatives include no chest pain or myalgias. Current antihyperlipidemic treatment includes statins. The current treatment provides mild improvement of lipids. Risk factors for coronary artery disease include diabetes mellitus, hypertension, male sex and dyslipidemia.      Review of Systems  Constitutional: Negative.   HENT: Negative.   Respiratory: Negative.   Cardiovascular: Negative for chest pain and palpitations.  Genitourinary: Negative.   Musculoskeletal: Negative for myalgias.  Neurological: Negative.  Negative for weakness and headaches.  Psychiatric/Behavioral: Negative.   All other systems reviewed and are negative.      Objective:   Physical Exam  Constitutional: He is oriented to person, place, and time. He appears well-developed and well-nourished.  HENT:  Head: Normocephalic and atraumatic.  Eyes: Conjunctivae are normal. Pupils are equal, round, and reactive to light.  Neck: Normal range of motion.  Cardiovascular: Normal rate and regular rhythm.   Pulmonary/Chest: Effort normal and breath sounds normal.  Abdominal: Soft.  Musculoskeletal: Normal range of motion.  Neurological: He is alert and oriented to person, place, and time. He has normal reflexes.  Skin: Skin is warm and dry.  Psychiatric: He has a normal mood and affect. His behavior is normal. Judgment and thought content normal.   BP 141/85 mmHg  Pulse 92  Temp(Src) 98.7 F (37.1 C) (Oral)  Ht 5' 5"  (1.651 m)  Wt 195 lb (88.451 kg)  BMI 32.45 kg/m2   Results for orders placed or performed in visit on 08/28/15  POCT glycosylated hemoglobin (Hb A1C)  Result Value Ref Range   Hemoglobin A1C 7.1         Assessment & Plan:   1. Type 2 diabetes mellitus  without complication, without long-term current use of insulin (HCC) Continue to watch carbs in diet - POCT glycosylated hemoglobin (Hb A1C) - Microalbumin / creatinine urine ratio - sitaGLIPtin-metformin (JANUMET) 50-1000 MG tablet; TAKE ONE  TABLET BY MOUTH TWICE DAILY WITH  A  MEAL  Dispense: 60 tablet; Refill: 5  2. Essential hypertension Do not add salt to diet - CMP14+EGFR - lisinopril-hydrochlorothiazide (PRINZIDE,ZESTORETIC) 20-25 MG tablet; Take 2 tablets by mouth daily.  Dispense: 180 tablet; Refill: 3 - amLODipine (NORVASC) 5 MG tablet; Take 1 tablet (5 mg total) by mouth daily.  Dispense: 30 tablet; Refill: 5  3. Hyperlipidemia Low fat diet  - Lipid panel - atorvastatin (LIPITOR) 40 MG tablet; Take 1 tablet (40 mg total) by mouth every morning.  Dispense: 30 tablet; Refill: 5 - fenofibrate (TRICOR) 145 MG tablet; TAKE ONE TABLET BY MOUTH ONCE DAILY, NEED OFFICE VISIT  Dispense: 30 tablet; Refill: 5  4. Encounter for immunization - Flu Vaccine QUAD 36+ mos IM  5. Prostate cancer [facility]) Keep follow up appointment with neurologist - tamsulosin (FLOMAX) 0.4 MG CAPS capsule; Take 1 capsule (0.4 mg total) by mouth daily.  Dispense: 30 capsule; Refill: 5  6. BMI 32.0-32.9,adult Discussed diet and exercise for person with BMI >25 Will recheck weight in 3-6 months  7. Screening for malignant neoplasm of the rectum - Fecal occult blood, imunochemical; Future  8. Need for hepatitis C screening test - Hepatitis C antibody    Labs pending Health maintenance reviewed Diet and exercise encouraged Continue all meds Follow up  In 3 months   Eleele, FNP

## 2015-08-28 NOTE — Patient Instructions (Signed)
Diabetes and Foot Care Diabetes may cause you to have problems because of poor blood supply (circulation) to your feet and legs. This may cause the skin on your feet to become thinner, break easier, and heal more slowly. Your skin may become dry, and the skin may peel and crack. You may also have nerve damage in your legs and feet causing decreased feeling in them. You may not notice minor injuries to your feet that could lead to infections or more serious problems. Taking care of your feet is one of the most important things you can do for yourself.  HOME CARE INSTRUCTIONS  Wear shoes at all times, even in the house. Do not go barefoot. Bare feet are easily injured.  Check your feet daily for blisters, cuts, and redness. If you cannot see the bottom of your feet, use a mirror or ask someone for help.  Wash your feet with warm water (do not use hot water) and mild soap. Then pat your feet and the areas between your toes until they are completely dry. Do not soak your feet as this can dry your skin.  Apply a moisturizing lotion or petroleum jelly (that does not contain alcohol and is unscented) to the skin on your feet and to dry, brittle toenails. Do not apply lotion between your toes.  Trim your toenails straight across. Do not dig under them or around the cuticle. File the edges of your nails with an emery board or nail file.  Do not cut corns or calluses or try to remove them with medicine.  Wear clean socks or stockings every day. Make sure they are not too tight. Do not wear knee-high stockings since they may decrease blood flow to your legs.  Wear shoes that fit properly and have enough cushioning. To break in new shoes, wear them for just a few hours a day. This prevents you from injuring your feet. Always look in your shoes before you put them on to be sure there are no objects inside.  Do not cross your legs. This may decrease the blood flow to your feet.  If you find a minor scrape,  cut, or break in the skin on your feet, keep it and the skin around it clean and dry. These areas may be cleansed with mild soap and water. Do not cleanse the area with peroxide, alcohol, or iodine.  When you remove an adhesive bandage, be sure not to damage the skin around it.  If you have a wound, look at it several times a day to make sure it is healing.  Do not use heating pads or hot water bottles. They may burn your skin. If you have lost feeling in your feet or legs, you may not know it is happening until it is too late.  Make sure your health care provider performs a complete foot exam at least annually or more often if you have foot problems. Report any cuts, sores, or bruises to your health care provider immediately. SEEK MEDICAL CARE IF:   You have an injury that is not healing.  You have cuts or breaks in the skin.  You have an ingrown nail.  You notice redness on your legs or feet.  You feel burning or tingling in your legs or feet.  You have pain or cramps in your legs and feet.  Your legs or feet are numb.  Your feet always feel cold. SEEK IMMEDIATE MEDICAL CARE IF:   There is increasing redness,   swelling, or pain in or around a wound.  There is a red line that goes up your leg.  Pus is coming from a wound.  You develop a fever or as directed by your health care provider.  You notice a bad smell coming from an ulcer or wound.   This information is not intended to replace advice given to you by your health care provider. Make sure you discuss any questions you have with your health care provider.   Document Released: 07/02/2000 Document Revised: 03/07/2013 Document Reviewed: 12/12/2012 Elsevier Interactive Patient Education 2016 [employer]  

## 2015-08-29 LAB — CMP14+EGFR
A/G RATIO: 1.7 (ref 1.1–2.5)
ALBUMIN: 4.4 g/dL (ref 3.6–4.8)
ALT: 23 IU/L (ref 0–44)
AST: 22 IU/L (ref 0–40)
Alkaline Phosphatase: 46 IU/L (ref 39–117)
BUN/Creatinine Ratio: 14 (ref 10–22)
BUN: 16 mg/dL (ref 8–27)
Bilirubin Total: 0.3 mg/dL (ref 0.0–1.2)
CALCIUM: 9.9 mg/dL (ref 8.6–10.2)
CHLORIDE: 101 mmol/L (ref 96–106)
CO2: 28 mmol/L (ref 18–29)
Creatinine, Ser: 1.15 mg/dL (ref 0.76–1.27)
GFR, EST AFRICAN AMERICAN: 79 mL/min/{1.73_m2} (ref >59)
GFR, EST NON AFRICAN AMERICAN: 68 mL/min/{1.73_m2} (ref >59)
GLOBULIN, TOTAL: 2.6 g/dL (ref 1.5–4.5)
Glucose: 88 mg/dL (ref 65–99)
Potassium: 4.3 mmol/L (ref 3.5–5.2)
SODIUM: 142 mmol/L (ref 134–144)
Total Protein: 7 g/dL (ref 6.0–8.5)

## 2015-08-29 LAB — LIPID PANEL
CHOLESTEROL TOTAL: 113 mg/dL (ref 100–199)
Chol/HDL Ratio: 3 ratio units (ref 0.0–5.0)
HDL: 38 mg/dL — ABNORMAL LOW (ref >39)
LDL Calculated: 50 mg/dL (ref 0–99)
TRIGLYCERIDES: 123 mg/dL (ref 0–149)
VLDL Cholesterol Cal: 25 mg/dL (ref 5–40)

## 2015-08-29 LAB — MICROALBUMIN / CREATININE URINE RATIO
Creatinine, Urine: 85.1 mg/dL
MICROALB/CREAT RATIO: 9.4 mg/g creat (ref 0.0–30.0)
Microalbumin, Urine: 8 ug/mL

## 2015-08-29 LAB — HEPATITIS C ANTIBODY: Hep C Virus Ab: <0.1 s/co ratio (ref 0.0–0.9)

## 2015-09-03 NOTE — Progress Notes (Signed)
Patient aware.

## 2015-09-26 ENCOUNTER — Other Ambulatory Visit: Payer: Self-pay | Admitting: Family Medicine

## 2015-11-18 ENCOUNTER — Telehealth: Payer: Self-pay | Admitting: Nurse Practitioner

## 2015-11-21 NOTE — Telephone Encounter (Signed)
Person at phone number [phone] says she does not know Brandon Elliott and not to call back. Correct number were called but not able to leave a message.      We have no record of contacting this patient.

## 2016-01-14 ENCOUNTER — Ambulatory Visit (INDEPENDENT_AMBULATORY_CARE_PROVIDER_SITE_OTHER): Payer: Managed Care, Other (non HMO) | Admitting: Physician Assistant

## 2016-01-14 ENCOUNTER — Encounter: Payer: Self-pay | Admitting: Physician Assistant

## 2016-01-14 VITALS — BP 136/76 | HR 96 | Temp 98.5°F | Ht 65.0 in | Wt 188.6 lb

## 2016-01-14 DIAGNOSIS — R300 Dysuria: Secondary | ICD-10-CM

## 2016-01-14 LAB — MICROSCOPIC EXAMINATION

## 2016-01-14 LAB — URINALYSIS, COMPLETE
Bilirubin, UA: NEGATIVE
Nitrite, UA: NEGATIVE
Protein, UA: NEGATIVE
RBC, UA: NEGATIVE
Specific Gravity, UA: 1.025 (ref 1.005–1.030)
Urobilinogen, Ur: 1 mg/dL (ref 0.2–1.0)
pH, UA: 5 (ref 5.0–7.5)

## 2016-01-14 MED ORDER — SULFAMETHOXAZOLE-TRIMETHOPRIM 800-160 MG PO TABS: 1.0000 | ORAL_TABLET | Freq: Two times a day (BID) | ORAL | Status: DC

## 2016-01-14 NOTE — Addendum Note (Signed)
Addended by: Thana Ates on: 01/14/2016 04:45 PM   Modules accepted: Orders

## 2016-01-14 NOTE — Patient Instructions (Signed)

## 2016-01-14 NOTE — Progress Notes (Signed)
Subjective:     Patient ID: Brandon Elliott, male   DOB: 19-Jul-1956, 60 y.o.   MRN: [id number]  HPI Pt with recent hx of dysuria and polyuria He has hx of prostate Ca and seen by Urol q 6 month Last appt ~ 4.5 months ago   Review of Systems  Constitutional: Negative.   Gastrointestinal: Negative.   Genitourinary: Positive for dysuria, urgency, frequency and decreased urine volume. Negative for hematuria, discharge, enuresis, difficulty urinating and penile pain.  Musculoskeletal: Negative.        Objective:   Physical Exam  Constitutional: He appears well-developed and well-nourished.  Abdominal: Soft. Bowel sounds are normal. He exhibits no distension and no mass. There is no tenderness. There is no rebound and no guarding.  No CVAT  Nursing note and vitals reviewed. UA see labs     Assessment:     1. Dysuria        Plan:     Discussed findings of UA with pt Admits to not watching his diet and eating ice cream and cookies etc Stressed importance of eating approp with his diabetes Bactrim DS bid x 10 days for sx If sx continue f/u with Urol Also needs to f/u with regular provider regarding diabetes

## 2016-01-15 LAB — URINE CULTURE: Organism ID, Bacteria: NO GROWTH

## 2016-02-12 IMAGING — US US SCROTUM
1 series · 13 of 25 positions shown · non-contrast
Comparison: None.

CLINICAL DATA: RIGHT testicular pain and swelling for 1 day

EXAM:
SCROTAL ULTRASOUND
DOPPLER ULTRASOUND OF THE TESTICLES
TECHNIQUE: Complete ultrasound examination of the testicles, epididymis, and
other scrotal structures was performed. Color and spectral Doppler
ultrasound were also utilized to evaluate blood flow to the
testicles.

[Series 1: us scrotum · 0.06mm/px · 13 of 50 slices shown]
[im 1/50]
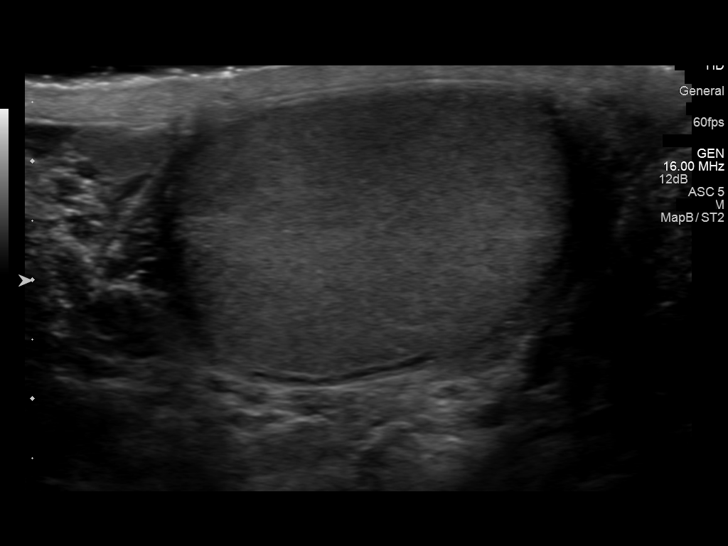
[im 5/50]
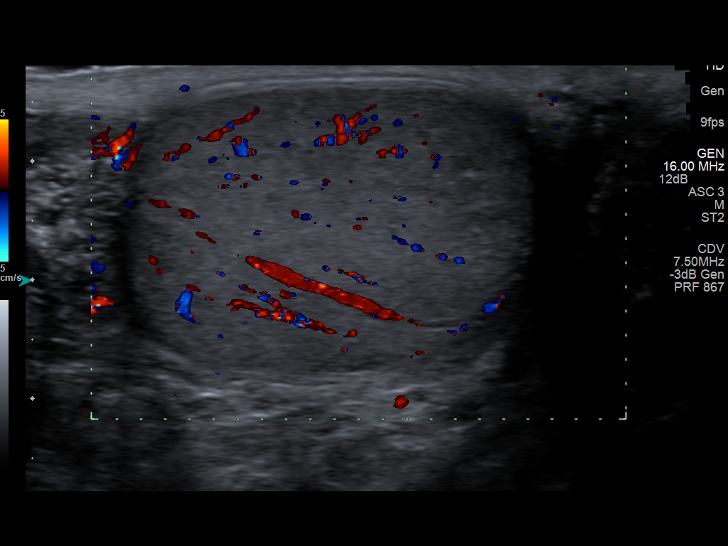
[im 9/50]
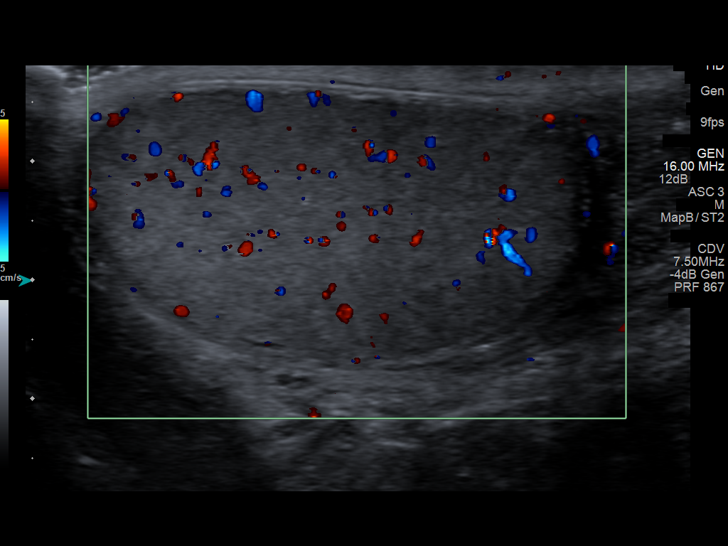
[im 13/50]
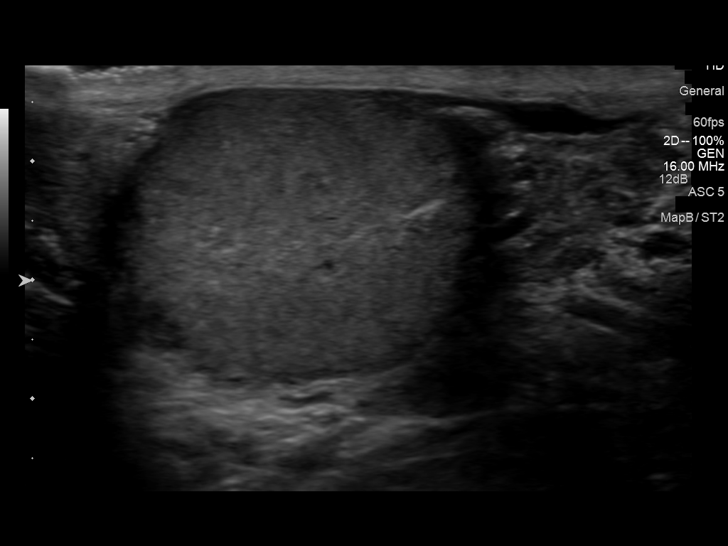
[im 17/50]
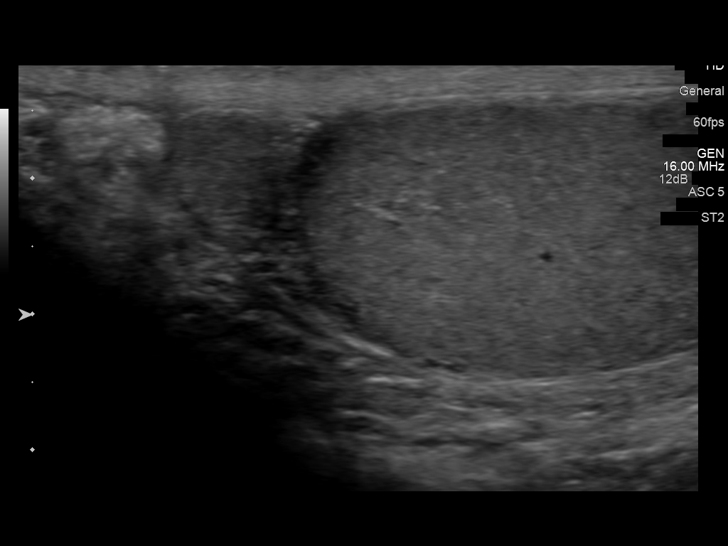
[im 21/50]
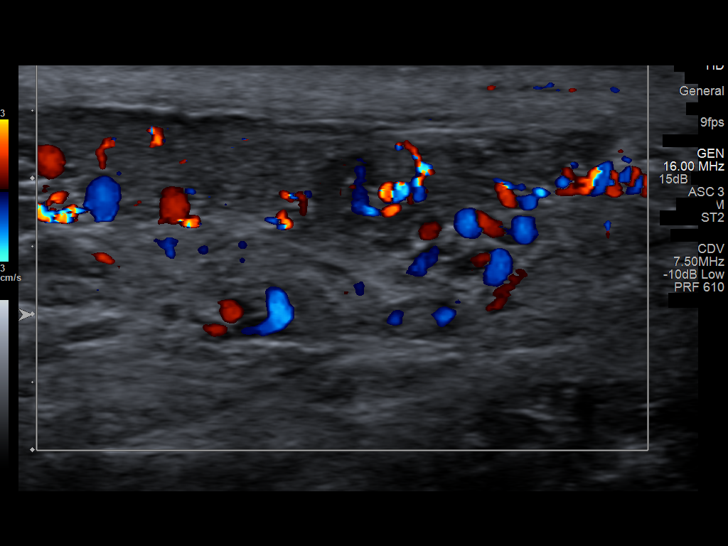
[im 25/50]
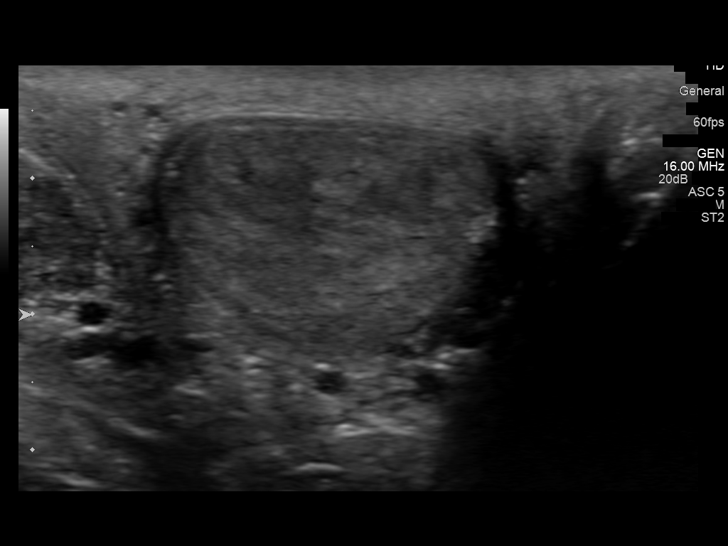
[im 29/50]
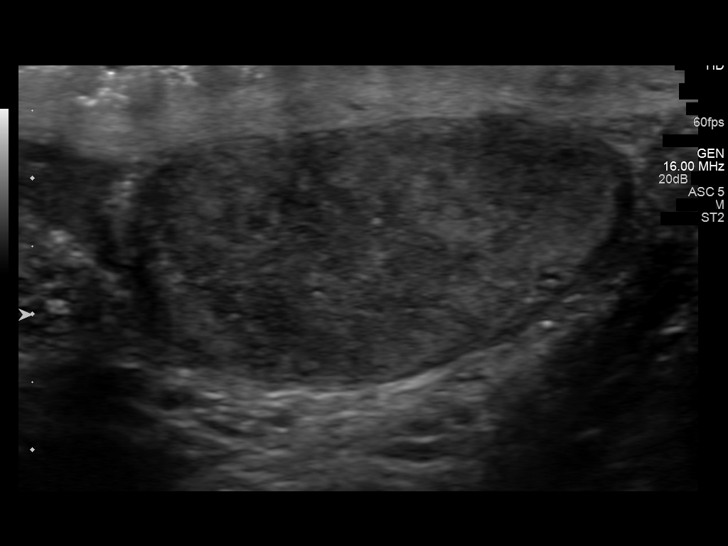
[im 33/50]
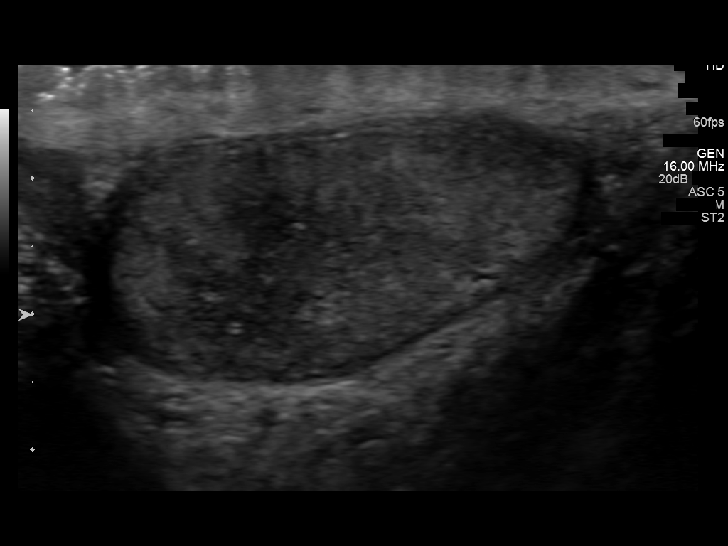
[im 37/50]
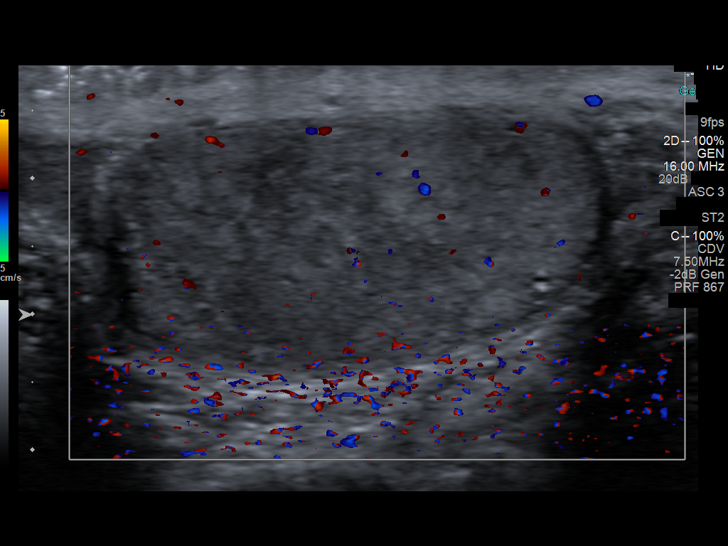
[im 41/50]
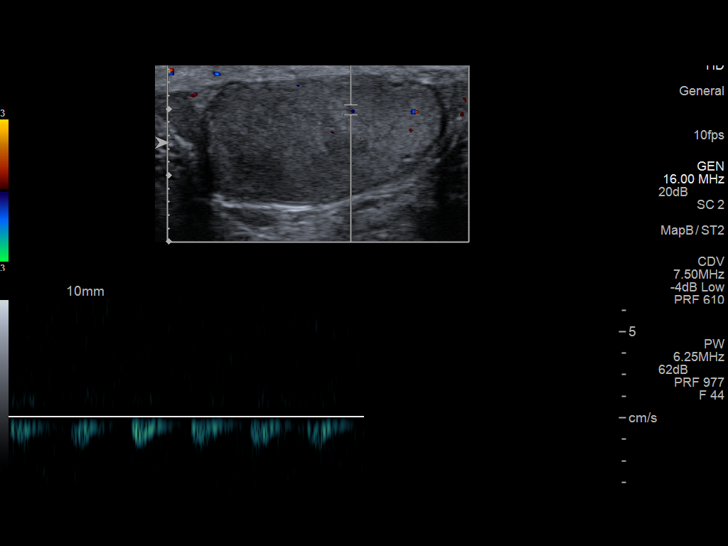
[im 45/50]
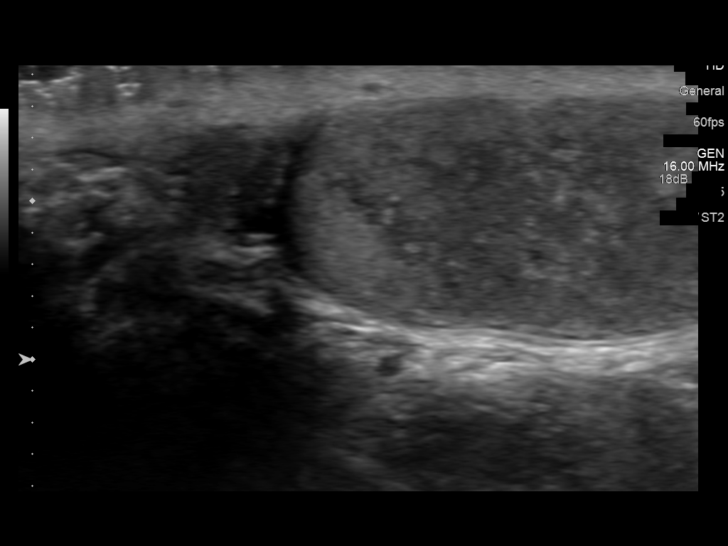
[im 50/50]
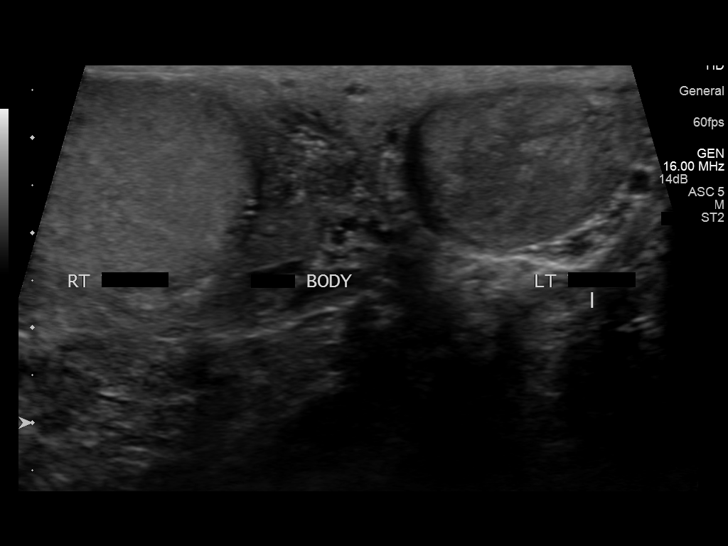

[13 of 25 positions shown; findings below may reference images not displayed]

FINDINGS: Right testicle

Measurements: 4.1 x 2.3 x 3.3 cm. Normal morphology without mass or
calcification. Increased blood flow within RIGHT testis on color
Doppler imaging.

Left testicle

Measurements: 3.8 x 1.9 x 2.5 cm. Smaller size versus RIGHT.
Diffusely heterogeneous echogenicity. No discrete mass. Internal
blood flow present on color Doppler imaging, diminished versus RIGHT
and questionably diminished versus expected.

Right epididymis: Mildly prominent head and proximal body,
associated with hyperemia.

Left epididymis:  Normal in size and appearance.

Hydrocele:  Small RIGHT hydrocele

Varicocele:  Absent bilaterally

Pulsed Doppler interrogation of both testes demonstrates normal low
resistance arterial and venous waveforms bilaterally.
IMPRESSION: Hypervascular RIGHT testis and epididymis compatible with epididymal
orchitis.

No evidence of RIGHT testicular torsion or mass.

LEFT testis is smaller and is diffusely heterogeneous in
echogenicity versus RIGHT though internal low resistance arterial
and venous waveforms are identified.

The appearance of the LEFT testis could be due to prior torsion or
infection, less likely due to a diffuse infiltrative process.

In the absence of acute symptoms involving the LEFT testis, acute
torsion is considered less likely.

Has patient had a history of prior LEFT testicular symptoms/torsion
or orchitis?

Followup ultrasound recommended in 3 months to reassess the LEFT
testis.

Findings called to Dr. Bambucafe on 12/20/2014 at 6745 hours.

## 2016-02-23 ENCOUNTER — Other Ambulatory Visit: Payer: Self-pay | Admitting: Nurse Practitioner

## 2016-02-23 ENCOUNTER — Other Ambulatory Visit: Payer: Self-pay | Admitting: Family Medicine

## 2016-03-01 ENCOUNTER — Other Ambulatory Visit: Payer: Self-pay | Admitting: Nurse Practitioner

## 2016-03-01 DIAGNOSIS — I10 Essential (primary) hypertension: Secondary | ICD-10-CM

## 2016-03-19 ENCOUNTER — Other Ambulatory Visit: Payer: Self-pay | Admitting: Nurse Practitioner

## 2016-03-23 ENCOUNTER — Other Ambulatory Visit: Payer: Self-pay | Admitting: Nurse Practitioner

## 2016-04-12 ENCOUNTER — Other Ambulatory Visit: Payer: Self-pay | Admitting: Family Medicine

## 2016-04-25 ENCOUNTER — Other Ambulatory Visit: Payer: Self-pay | Admitting: Nurse Practitioner

## 2016-05-30 ENCOUNTER — Other Ambulatory Visit: Payer: Self-pay | Admitting: Nurse Practitioner

## 2016-05-30 ENCOUNTER — Other Ambulatory Visit: Payer: Self-pay | Admitting: Family Medicine

## 2016-05-31 ENCOUNTER — Other Ambulatory Visit: Payer: Self-pay | Admitting: *Deleted

## 2016-05-31 MED ORDER — ATORVASTATIN CALCIUM 40 MG PO TABS: 40.0000 mg | ORAL_TABLET | Freq: Every morning | ORAL | 0 refills | Status: DC

## 2016-06-02 ENCOUNTER — Other Ambulatory Visit: Payer: Self-pay | Admitting: *Deleted

## 2016-07-02 ENCOUNTER — Other Ambulatory Visit: Payer: Self-pay | Admitting: Family Medicine

## 2016-07-02 ENCOUNTER — Other Ambulatory Visit: Payer: Self-pay | Admitting: Pediatrics

## 2016-07-02 DIAGNOSIS — I10 Essential (primary) hypertension: Secondary | ICD-10-CM

## 2016-07-05 NOTE — Telephone Encounter (Signed)
Authorize 30 days only. Then contact the patient letting them know that they will need an appointment before any further prescriptions can be sent in. 

## 2016-07-05 NOTE — Telephone Encounter (Signed)
VM not setup on 280#

## 2016-07-06 NOTE — Telephone Encounter (Signed)
Pt aware needs appt. Will call back to set up.

## 2016-07-06 NOTE — Telephone Encounter (Signed)
Pt aware NTBS before next refill 

## 2016-07-16 ENCOUNTER — Other Ambulatory Visit: Payer: Self-pay | Admitting: Nurse Practitioner

## 2016-07-16 DIAGNOSIS — E119 Type 2 diabetes mellitus without complications: Secondary | ICD-10-CM

## 2016-08-07 ENCOUNTER — Other Ambulatory Visit: Payer: Self-pay | Admitting: Nurse Practitioner

## 2016-08-07 ENCOUNTER — Other Ambulatory Visit: Payer: Self-pay | Admitting: Family Medicine

## 2016-08-09 ENCOUNTER — Encounter: Payer: Self-pay | Admitting: Nurse Practitioner

## 2016-08-09 ENCOUNTER — Ambulatory Visit (INDEPENDENT_AMBULATORY_CARE_PROVIDER_SITE_OTHER): Payer: Managed Care, Other (non HMO) | Admitting: Nurse Practitioner

## 2016-08-09 VITALS — Temp 98.8°F | Ht 65.0 in | Wt 194.0 lb

## 2016-08-09 DIAGNOSIS — Z1211 Encounter for screening for malignant neoplasm of colon: Secondary | ICD-10-CM | POA: Diagnosis not present

## 2016-08-09 DIAGNOSIS — Z6832 Body mass index (BMI) 32.0-32.9, adult: Secondary | ICD-10-CM | POA: Diagnosis not present

## 2016-08-09 DIAGNOSIS — I10 Essential (primary) hypertension: Secondary | ICD-10-CM | POA: Diagnosis not present

## 2016-08-09 DIAGNOSIS — E785 Hyperlipidemia, unspecified: Secondary | ICD-10-CM | POA: Diagnosis not present

## 2016-08-09 DIAGNOSIS — Z1212 Encounter for screening for malignant neoplasm of rectum: Secondary | ICD-10-CM | POA: Diagnosis not present

## 2016-08-09 DIAGNOSIS — C61 Malignant neoplasm of prostate: Secondary | ICD-10-CM

## 2016-08-09 DIAGNOSIS — E119 Type 2 diabetes mellitus without complications: Secondary | ICD-10-CM | POA: Diagnosis not present

## 2016-08-09 LAB — BAYER DCA HB A1C WAIVED: HB A1C (BAYER DCA - WAIVED): 7.1 % — ABNORMAL HIGH (ref <7.0)

## 2016-08-09 MED ORDER — ATORVASTATIN CALCIUM 40 MG PO TABS: 40.0000 mg | ORAL_TABLET | Freq: Every morning | ORAL | 1 refills | Status: DC

## 2016-08-09 MED ORDER — FENOFIBRATE 145 MG PO TABS: ORAL_TABLET | ORAL | 1 refills | Status: DC

## 2016-08-09 MED ORDER — LISINOPRIL-HYDROCHLOROTHIAZIDE 20-25 MG PO TABS: 2.0000 | ORAL_TABLET | Freq: Every day | ORAL | 1 refills | Status: DC

## 2016-08-09 MED ORDER — TAMSULOSIN HCL 0.4 MG PO CAPS: 0.4000 mg | ORAL_CAPSULE | Freq: Every day | ORAL | 1 refills | Status: DC

## 2016-08-09 MED ORDER — SITAGLIPTIN PHOS-METFORMIN HCL 50-1000 MG PO TABS: 1.0000 | ORAL_TABLET | Freq: Two times a day (BID) | ORAL | 1 refills | Status: DC

## 2016-08-09 MED ORDER — AMLODIPINE BESYLATE 5 MG PO TABS
5.0000 mg | ORAL_TABLET | Freq: Every day | ORAL | 1 refills | Status: DC
Start: 2016-08-09 — End: 2017-02-15

## 2016-08-09 NOTE — Telephone Encounter (Signed)
Authorize 30 days only. Then contact the patient letting them know that they will need an appointment before any further prescriptions can be sent in. 

## 2016-08-09 NOTE — Telephone Encounter (Signed)
Pt coming today.

## 2016-08-09 NOTE — Patient Instructions (Signed)

## 2016-08-09 NOTE — Progress Notes (Signed)
Subjective:    Patient ID: Brandon Elliott, Brandon Elliott    DOB: 21-Sep-1955, 61 y.o.   MRN: [id number]  Patient here today for follow up of chronic medical problems. Patient not very compliant on meds. So noncompliant that he should have ran out of meds some time ago but just recently ran out.Has no complaints today.  Outpatient Encounter Prescriptions as of 08/09/2016  Medication Sig  . amLODipine (NORVASC) 5 MG tablet TAKE ONE TABLET BY MOUTH ONCE DAILY  . atorvastatin (LIPITOR) 40 MG tablet Take 1 tablet (40 mg total) by mouth every morning.  . fenofibrate (TRICOR) 145 MG tablet TAKE ONE TABLET BY MOUTH ONCE DAILY NEEDS  OFFICE  VISIT  . JANUMET 50-1000 MG tablet TAKE ONE TABLET BY MOUTH TWICE DAILY WITH  A  MEAL  . lisinopril-hydrochlorothiazide (PRINZIDE,ZESTORETIC) 20-25 MG tablet Take 2 tablets by mouth daily.  . tamsulosin (FLOMAX) 0.4 MG CAPS capsule Take 1 capsule (0.4 mg total) by mouth daily.    Diabetes  He presents for his follow-up diabetic visit. He has type 2 diabetes mellitus. Pertinent negatives for hypoglycemia include no headaches. Pertinent negatives for diabetes include no chest pain, no foot paresthesias, no visual change and no weakness. Pertinent negatives for hypoglycemia complications include no nocturnal hypoglycemia. Risk factors for coronary artery disease include Brandon Elliott sex, hypertension, dyslipidemia and diabetes mellitus. He is compliant with treatment most of the time. Diabetic current diet: not watching diet at all. He has not had a previous visit with a dietitian. He rarely participates in exercise. Home blood sugar record trend: patient has not been checking blood sugars at home. An ACE inhibitor/angiotensin II receptor blocker is being taken. He does not see a podiatrist.Eye exam is not current.  Hypertension  This is a chronic problem. Pertinent negatives include no chest pain, headaches or palpitations. Risk factors for coronary artery disease include Brandon Elliott gender,  dyslipidemia, diabetes mellitus and smoking/tobacco exposure. Past treatments include diuretics.  Hyperlipidemia  This is a chronic problem. The current episode started more than 1 year ago. The problem is controlled. Pertinent negatives include no chest pain or myalgias. Current antihyperlipidemic treatment includes statins. The current treatment provides mild improvement of lipids. Risk factors for coronary artery disease include diabetes mellitus, hypertension, Brandon Elliott sex and dyslipidemia.      Review of Systems  Constitutional: Negative.   HENT: Negative.   Respiratory: Negative.   Cardiovascular: Negative for chest pain and palpitations.  Genitourinary: Negative.   Musculoskeletal: Negative for myalgias.  Neurological: Negative.  Negative for weakness and headaches.  Psychiatric/Behavioral: Negative.   All other systems reviewed and are negative.      Objective:   Physical Exam  Constitutional: He is oriented to person, place, and time. He appears well-developed and well-nourished.  HENT:  Head: Normocephalic and atraumatic.  Eyes: Conjunctivae are normal. Pupils are equal, round, and reactive to light.  Neck: Normal range of motion.  Cardiovascular: Normal rate and regular rhythm.   Pulmonary/Chest: Effort normal and breath sounds normal.  Abdominal: Soft.  Musculoskeletal: Normal range of motion.  Neurological: He is alert and oriented to person, place, and time. He has normal reflexes.  Skin: Skin is warm and dry.  Psychiatric: He has a normal mood and affect. His behavior is normal. Judgment and thought content normal.   Temp 98.8 F (37.1 C) (Oral)   Ht 5' 5"  (1.651 m)   Wt 194 lb (88 kg)   BMI 32.28 kg/m  Assessment & Plan:   1. Type 2 diabetes mellitus without complication, without long-term current use of insulin (HCC) Continue to watch carbs in diet - Bayer DCA Hb A1c Waived - sitaGLIPtin-metformin (JANUMET) 50-1000 MG tablet; Take 1 tablet by  mouth 2 (two) times daily with a meal.  Dispense: 180 tablet; Refill: 1  2. Essential hypertension Low sodium diet - CMP14+EGFR - lisinopril-hydrochlorothiazide (PRINZIDE,ZESTORETIC) 20-25 MG tablet; Take 2 tablets by mouth daily.  Dispense: 180 tablet; Refill: 1 - amLODipine (NORVASC) 5 MG tablet; Take 1 tablet (5 mg total) by mouth daily.  Dispense: 90 tablet; Refill: 1  3. Hyperlipidemia, unspecified hyperlipidemia type Low fat diet - Lipid panel - atorvastatin (LIPITOR) 40 MG tablet; Take 1 tablet (40 mg total) by mouth every morning.  Dispense: 90 tablet; Refill: 1 - fenofibrate (TRICOR) 145 MG tablet; TAKE ONE TABLET BY MOUTH ONCE DAILY NEEDS  OFFICE  VISIT  Dispense: 90 tablet; Refill: 1  4. BMI 32.0-32.9,adult Discussed diet and exercise for person with BMI >25 Will recheck weight in 3-6 months   5. Prostate cancer ([place]) - tamsulosin (FLOMAX) 0.4 MG CAPS capsule; Take 1 capsule (0.4 mg total) by mouth daily.  Dispense: 90 capsule; Refill: 1   Encouraged to get eye exam hemoccult cards given to patient with directions Labs pending Health maintenance reviewed Diet and exercise encouraged Continue all meds Follow up  In 3 months   Sp[place], FNP

## 2016-08-10 LAB — CMP14+EGFR
A/G RATIO: 1.8 (ref 1.2–2.2)
ALK PHOS: 41 IU/L (ref 39–117)
ALT: 24 IU/L (ref 0–44)
AST: 22 IU/L (ref 0–40)
Albumin: 4.5 g/dL (ref 3.6–4.8)
BILIRUBIN TOTAL: 0.3 mg/dL (ref 0.0–1.2)
BUN/Creatinine Ratio: 12 (ref 10–24)
BUN: 12 mg/dL (ref 8–27)
CALCIUM: 9.4 mg/dL (ref 8.6–10.2)
CHLORIDE: 100 mmol/L (ref 96–106)
CO2: 24 mmol/L (ref 18–29)
Creatinine, Ser: 1.02 mg/dL (ref 0.76–1.27)
GFR calc Af Amer: 91 mL/min/{1.73_m2} (ref >59)
GFR calc non Af Amer: 78 mL/min/{1.73_m2} (ref >59)
GLOBULIN, TOTAL: 2.5 g/dL (ref 1.5–4.5)
Glucose: 91 mg/dL (ref 65–99)
POTASSIUM: 3.9 mmol/L (ref 3.5–5.2)
Sodium: 141 mmol/L (ref 134–144)
Total Protein: 7 g/dL (ref 6.0–8.5)

## 2016-08-10 LAB — LIPID PANEL
Chol/HDL Ratio: 2.3 ratio units (ref 0.0–5.0)
Cholesterol, Total: 103 mg/dL (ref 100–199)
HDL: 44 mg/dL (ref >39)
LDL Calculated: 49 mg/dL (ref 0–99)
TRIGLYCERIDES: 49 mg/dL (ref 0–149)
VLDL Cholesterol Cal: 10 mg/dL (ref 5–40)

## 2016-08-10 NOTE — Progress Notes (Signed)
Patient aware.

## 2016-08-30 ENCOUNTER — Other Ambulatory Visit: Payer: Self-pay | Admitting: Family Medicine

## 2016-08-30 DIAGNOSIS — E119 Type 2 diabetes mellitus without complications: Secondary | ICD-10-CM

## 2016-09-08 ENCOUNTER — Ambulatory Visit: Payer: Managed Care, Other (non HMO) | Admitting: Pediatrics

## 2016-09-09 ENCOUNTER — Encounter: Payer: Self-pay | Admitting: Family Medicine

## 2016-10-20 ENCOUNTER — Telehealth: Payer: Self-pay | Admitting: Family Medicine

## 2016-10-20 NOTE — Telephone Encounter (Signed)
What is the name of the medication? He does not know. It is one of his bp meds.  Have you contacted your pharmacy to request a refill? Yes, they say it is too early.  Which pharmacy would you like this sent to? [facility] in [place].   Patient notified that their request is being sent to the clinical staff for review and that they should receive a call once it is complete. If they do not receive a call within 24 hours they can check with their pharmacy or our office.

## 2016-10-20 NOTE — Telephone Encounter (Signed)
Script for amlodipine with quantity of 90 is ready for pick up at [facility].  Unable to leave patient a message.

## 2016-10-20 NOTE — Telephone Encounter (Signed)
Per Snook ,[facility] staff, patient came by and picked up blood pressure medication and he still has a refill on these.  Unable to leave a voice mail for patient.

## 2017-02-11 ENCOUNTER — Telehealth: Payer: Self-pay

## 2017-02-11 NOTE — Telephone Encounter (Signed)
-----   Message from Chevis Pretty, [place] sent at 02/11/2017  7:55 AM EDT ----- Remind patient to please do hemoccult cards given at appointment

## 2017-02-11 NOTE — Telephone Encounter (Signed)
Attempted to contact patient - NVM 

## 2017-02-11 NOTE — Progress Notes (Signed)
Attempted to contact patient- NVM 7/27 jr

## 2017-02-15 ENCOUNTER — Other Ambulatory Visit: Payer: Self-pay | Admitting: Nurse Practitioner

## 2017-02-15 DIAGNOSIS — I10 Essential (primary) hypertension: Secondary | ICD-10-CM

## 2017-02-15 DIAGNOSIS — E785 Hyperlipidemia, unspecified: Secondary | ICD-10-CM

## 2017-02-16 ENCOUNTER — Other Ambulatory Visit: Payer: Self-pay | Admitting: *Deleted

## 2017-02-16 DIAGNOSIS — C61 Malignant neoplasm of prostate: Secondary | ICD-10-CM

## 2017-02-16 MED ORDER — TAMSULOSIN HCL 0.4 MG PO CAPS: 0.4000 mg | ORAL_CAPSULE | Freq: Every day | ORAL | 0 refills | Status: DC

## 2017-03-19 ENCOUNTER — Other Ambulatory Visit: Payer: Self-pay | Admitting: Nurse Practitioner

## 2017-03-19 DIAGNOSIS — I10 Essential (primary) hypertension: Secondary | ICD-10-CM

## 2017-03-19 DIAGNOSIS — E785 Hyperlipidemia, unspecified: Secondary | ICD-10-CM

## 2017-03-22 NOTE — Telephone Encounter (Signed)
Last refill without being seen 

## 2017-05-12 ENCOUNTER — Other Ambulatory Visit: Payer: Self-pay | Admitting: Nurse Practitioner

## 2017-05-12 DIAGNOSIS — I10 Essential (primary) hypertension: Secondary | ICD-10-CM

## 2017-05-13 NOTE — Telephone Encounter (Signed)
Last seen 08/09/16  Dr Livia Snellen

## 2017-06-07 ENCOUNTER — Other Ambulatory Visit: Payer: Self-pay | Admitting: Nurse Practitioner

## 2017-06-07 DIAGNOSIS — E785 Hyperlipidemia, unspecified: Secondary | ICD-10-CM

## 2017-06-07 NOTE — Telephone Encounter (Signed)
Last lipid 12/21/8 

## 2017-06-22 NOTE — Progress Notes (Signed)
Attempted to contact patient - NVM 

## 2017-07-07 ENCOUNTER — Other Ambulatory Visit: Payer: Self-pay | Admitting: Nurse Practitioner

## 2017-07-07 ENCOUNTER — Other Ambulatory Visit: Payer: Self-pay | Admitting: Family Medicine

## 2017-07-07 DIAGNOSIS — I10 Essential (primary) hypertension: Secondary | ICD-10-CM

## 2017-07-07 DIAGNOSIS — E119 Type 2 diabetes mellitus without complications: Secondary | ICD-10-CM

## 2017-07-08 ENCOUNTER — Other Ambulatory Visit: Payer: Self-pay | Admitting: Nurse Practitioner

## 2017-07-08 DIAGNOSIS — E785 Hyperlipidemia, unspecified: Secondary | ICD-10-CM

## 2017-07-08 NOTE — Telephone Encounter (Signed)
Authorize 30 days only. Then contact the patient letting them know that they will need an appointment before any further prescriptions can be sent in. 

## 2017-07-13 NOTE — Telephone Encounter (Signed)
Last lipid 08/09/16  Dr Livia Snellen

## 2017-07-13 NOTE — Telephone Encounter (Signed)
Authorize 30 days only. Then contact the patient letting them know that they will need an appointment before any further prescriptions can be sent in. 

## 2017-08-02 ENCOUNTER — Encounter: Payer: Self-pay | Admitting: Nurse Practitioner

## 2017-08-02 ENCOUNTER — Ambulatory Visit: Payer: Managed Care, Other (non HMO) | Admitting: Nurse Practitioner

## 2017-08-02 VITALS — BP 128/76 | HR 105 | Temp 99.3°F | Ht 65.0 in | Wt 190.0 lb

## 2017-08-02 DIAGNOSIS — E119 Type 2 diabetes mellitus without complications: Secondary | ICD-10-CM

## 2017-08-02 DIAGNOSIS — E785 Hyperlipidemia, unspecified: Secondary | ICD-10-CM | POA: Diagnosis not present

## 2017-08-02 DIAGNOSIS — C61 Malignant neoplasm of prostate: Secondary | ICD-10-CM

## 2017-08-02 DIAGNOSIS — I10 Essential (primary) hypertension: Secondary | ICD-10-CM

## 2017-08-02 DIAGNOSIS — Z23 Encounter for immunization: Secondary | ICD-10-CM | POA: Diagnosis not present

## 2017-08-02 DIAGNOSIS — Z6832 Body mass index (BMI) 32.0-32.9, adult: Secondary | ICD-10-CM | POA: Diagnosis not present

## 2017-08-02 LAB — BAYER DCA HB A1C WAIVED: HB A1C: 6.8 % (ref <7.0)

## 2017-08-02 MED ORDER — SITAGLIPTIN PHOS-METFORMIN HCL 50-1000 MG PO TABS: 1.0000 | ORAL_TABLET | Freq: Two times a day (BID) | ORAL | 1 refills | Status: DC

## 2017-08-02 MED ORDER — LISINOPRIL-HYDROCHLOROTHIAZIDE 20-25 MG PO TABS: 2.0000 | ORAL_TABLET | Freq: Every day | ORAL | 1 refills | Status: DC

## 2017-08-02 MED ORDER — FENOFIBRATE 145 MG PO TABS: 145.0000 mg | ORAL_TABLET | Freq: Every day | ORAL | 1 refills | Status: DC

## 2017-08-02 MED ORDER — AMLODIPINE BESYLATE 5 MG PO TABS: 5.0000 mg | ORAL_TABLET | Freq: Every day | ORAL | 1 refills | Status: DC

## 2017-08-02 NOTE — Progress Notes (Signed)
Subjective:    Patient ID: Brandon Elliott, male    DOB: 26-May-1956, 62 y.o.   MRN: [id number]  HPI   Brandon Elliott is here today for follow up of chronic medical problem.  Outpatient Encounter Medications as of 08/02/2017  Medication Sig  . amLODipine (NORVASC) 5 MG tablet TAKE 1 TABLET BY MOUTH ONCE DAILY  . atorvastatin (LIPITOR) 40 MG tablet TAKE ONE TABLET BY MOUTH IN THE MORNING  . fenofibrate (TRICOR) 145 MG tablet TAKE 1 TABLET BY MOUTH ONCE DAILY NEEDS  OFFICE  VISIT  . JANUMET 50-1000 MG tablet TAKE ONE TABLET BY MOUTH TWICE DAILY A MEALS  . JANUMET 50-1000 MG tablet TAKE ONE TABLET BY MOUTH TWICE DAILY WITH A MEAL  . lisinopril-hydrochlorothiazide (PRINZIDE,ZESTORETIC) 20-25 MG tablet TAKE TWO TABLETS BY MOUTH ONCE DAILY  . tamsulosin (FLOMAX) 0.4 MG CAPS capsule Take 1 capsule (0.4 mg total) by mouth daily.     1. Type 2 diabetes mellitus without complication, without long-term current use of insulin ([place]) last hgba1c was 7.1%, which was last January 2018. He does not check blood sugars at home and admits to not watching diet  2. Essential hypertension  No c/o chest ain, SOB or headache. He does not check blood pressure at home.  3. Hyperlipidemia, unspecified hyperlipidemia type  Not watching diet at all  4. BMI 32.0-32.9,adult  No recent weight changes    New complaints: None today  Social history: Works for [employer]   Review of Systems  Constitutional: Negative for activity change and appetite change.  HENT: Negative.   Eyes: Negative for pain.  Respiratory: Negative for shortness of breath.   Cardiovascular: Negative for chest pain, palpitations and leg swelling.  Gastrointestinal: Negative for abdominal pain.  Endocrine: Negative for polydipsia.  Genitourinary: Negative.   Skin: Negative for rash.  Neurological: Negative for dizziness, weakness and headaches.  Hematological: Does not bruise/bleed easily.  Psychiatric/Behavioral: Negative.   All  other systems reviewed and are negative.      Objective:   Physical Exam  Constitutional: He is oriented to person, place, and time. He appears well-developed and well-nourished.  HENT:  Head: Normocephalic.  Right Ear: External ear normal.  Left Ear: External ear normal.  Nose: Nose normal.  Mouth/Throat: Oropharynx is clear and moist.  Eyes: EOM are normal. Pupils are equal, round, and reactive to light.  Neck: Normal range of motion. Neck supple. No JVD present. No thyromegaly present.  Cardiovascular: Normal rate, regular rhythm, normal heart sounds and intact distal pulses. Exam reveals no gallop and no friction rub.  No murmur heard. Pulmonary/Chest: Effort normal and breath sounds normal. No respiratory distress. He has no wheezes. He has no rales. He exhibits no tenderness.  Abdominal: Soft. Bowel sounds are normal. He exhibits no mass. There is no tenderness.  Genitourinary: Prostate normal and penis normal.  Musculoskeletal: Normal range of motion. He exhibits no edema.  Lymphadenopathy:    He has no cervical adenopathy.  Neurological: He is alert and oriented to person, place, and time. No cranial nerve deficit.  Skin: Skin is warm and dry.  Psychiatric: He has a normal mood and affect. His behavior is normal. Judgment and thought content normal.   BP 128/76   Pulse (!) 105   Temp 99.3 F (37.4 C) (Oral)   Ht 5' 5"  (1.651 m)   Wt 190 lb (86.2 kg)   BMI 31.62 kg/m   hgba1c 6.8%     Assessment & Plan:  1. Type 2 diabetes mellitus without complication, without long-term current use of insulin (HCC) Continue to watch carbsin diet - Bayer DCA Hb A1c Waived - Microalbumin / creatinine urine ratio - sitaGLIPtin-metformin (JANUMET) 50-1000 MG tablet; Take 1 tablet by mouth 2 (two) times daily with a meal.  Dispense: 180 tablet; Refill: 1  2. Essential hypertension Low sodium doiet - CMP14+EGFR - lisinopril-hydrochlorothiazide (PRINZIDE,ZESTORETIC) 20-25 MG tablet;  Take 2 tablets by mouth daily.  Dispense: 180 tablet; Refill: 1 - amLODipine (NORVASC) 5 MG tablet; Take 1 tablet (5 mg total) by mouth daily.  Dispense: 90 tablet; Refill: 1  3. Hyperlipidemia, unspecified hyperlipidemia type Low fat diet - Lipid panel - fenofibrate (TRICOR) 145 MG tablet; Take 1 tablet (145 mg total) by mouth daily.  Dispense: 90 tablet; Refill: 1  4. BMI 32.0-32.9,adult Discussed diet and exercise for person with BMI >25 Will recheck weight in 3-6 months  5. Prostate cancer [facility]) Keep follow up appointments with urology   Labs pending Health maintenance reviewed Diet and exercise encouraged Continue all meds Follow up  In 6 months [place], FNP

## 2017-08-02 NOTE — Patient Instructions (Signed)

## 2017-08-03 LAB — CMP14+EGFR
ALBUMIN: 4.5 g/dL (ref 3.6–4.8)
ALK PHOS: 70 IU/L (ref 39–117)
ALT: 29 IU/L (ref 0–44)
AST: 30 IU/L (ref 0–40)
Albumin/Globulin Ratio: 1.9 (ref 1.2–2.2)
BILIRUBIN TOTAL: 0.4 mg/dL (ref 0.0–1.2)
BUN / CREAT RATIO: 13 (ref 10–24)
BUN: 14 mg/dL (ref 8–27)
CHLORIDE: 99 mmol/L (ref 96–106)
CO2: 27 mmol/L (ref 20–29)
CREATININE: 1.07 mg/dL (ref 0.76–1.27)
Calcium: 9.6 mg/dL (ref 8.6–10.2)
GFR calc non Af Amer: 73 mL/min/{1.73_m2} (ref >59)
GFR, EST AFRICAN AMERICAN: 85 mL/min/{1.73_m2} (ref >59)
GLOBULIN, TOTAL: 2.4 g/dL (ref 1.5–4.5)
Glucose: 146 mg/dL — ABNORMAL HIGH (ref 65–99)
Potassium: 4.1 mmol/L (ref 3.5–5.2)
SODIUM: 138 mmol/L (ref 134–144)
TOTAL PROTEIN: 6.9 g/dL (ref 6.0–8.5)

## 2017-08-03 LAB — LIPID PANEL
CHOL/HDL RATIO: 2.6 ratio (ref 0.0–5.0)
CHOLESTEROL TOTAL: 112 mg/dL (ref 100–199)
HDL: 43 mg/dL (ref >39)
LDL CALC: 30 mg/dL (ref 0–99)
Triglycerides: 197 mg/dL — ABNORMAL HIGH (ref 0–149)
VLDL CHOLESTEROL CAL: 39 mg/dL (ref 5–40)

## 2017-08-03 LAB — MICROALBUMIN / CREATININE URINE RATIO
Creatinine, Urine: 159.1 mg/dL
Microalb/Creat Ratio: 2.6 mg/g creat (ref 0.0–30.0)
Microalbumin, Urine: 4.1 ug/mL

## 2017-08-07 ENCOUNTER — Other Ambulatory Visit: Payer: Self-pay | Admitting: Nurse Practitioner

## 2017-08-07 DIAGNOSIS — C61 Malignant neoplasm of prostate: Secondary | ICD-10-CM

## 2017-08-10 ENCOUNTER — Telehealth: Payer: Self-pay | Admitting: Nurse Practitioner

## 2017-08-10 NOTE — Telephone Encounter (Signed)
Patient aware of lab results.

## 2017-08-16 ENCOUNTER — Other Ambulatory Visit: Payer: Self-pay | Admitting: Nurse Practitioner

## 2017-08-16 DIAGNOSIS — E785 Hyperlipidemia, unspecified: Secondary | ICD-10-CM

## 2017-08-26 ENCOUNTER — Other Ambulatory Visit: Payer: Self-pay | Admitting: Nurse Practitioner

## 2017-08-26 DIAGNOSIS — I10 Essential (primary) hypertension: Secondary | ICD-10-CM

## 2017-09-25 ENCOUNTER — Other Ambulatory Visit: Payer: Self-pay | Admitting: Family Medicine

## 2017-09-25 DIAGNOSIS — I10 Essential (primary) hypertension: Secondary | ICD-10-CM

## 2017-11-18 ENCOUNTER — Other Ambulatory Visit: Payer: Self-pay | Admitting: Family Medicine

## 2017-11-18 DIAGNOSIS — E785 Hyperlipidemia, unspecified: Secondary | ICD-10-CM

## 2018-01-15 ENCOUNTER — Other Ambulatory Visit: Payer: Self-pay | Admitting: Family Medicine

## 2018-01-15 DIAGNOSIS — I10 Essential (primary) hypertension: Secondary | ICD-10-CM

## 2018-02-04 ENCOUNTER — Other Ambulatory Visit: Payer: Self-pay | Admitting: Family Medicine

## 2018-02-04 DIAGNOSIS — C61 Malignant neoplasm of prostate: Secondary | ICD-10-CM

## 2018-02-18 ENCOUNTER — Other Ambulatory Visit: Payer: Self-pay | Admitting: Family Medicine

## 2018-02-18 DIAGNOSIS — I10 Essential (primary) hypertension: Secondary | ICD-10-CM

## 2018-02-18 DIAGNOSIS — E785 Hyperlipidemia, unspecified: Secondary | ICD-10-CM

## 2018-02-18 DIAGNOSIS — E119 Type 2 diabetes mellitus without complications: Secondary | ICD-10-CM

## 2018-03-20 ENCOUNTER — Other Ambulatory Visit: Payer: Self-pay | Admitting: Family Medicine

## 2018-03-20 ENCOUNTER — Other Ambulatory Visit: Payer: Self-pay | Admitting: Nurse Practitioner

## 2018-03-20 DIAGNOSIS — E785 Hyperlipidemia, unspecified: Secondary | ICD-10-CM

## 2018-03-20 DIAGNOSIS — I10 Essential (primary) hypertension: Secondary | ICD-10-CM

## 2018-03-22 NOTE — Telephone Encounter (Signed)
Patient has no voice mail. Unable to leave a message to schedule a follow up appointment.  Letter will be mailed.

## 2018-03-22 NOTE — Telephone Encounter (Signed)
Needs OV.  Refill sent.

## 2018-03-22 NOTE — Telephone Encounter (Signed)
Last seen 08/02/17  MMM 

## 2018-03-22 NOTE — Telephone Encounter (Signed)
Authorize 30 days only. Then contact the patient letting them know that they will need an appointment before any further prescriptions can be sent in. 

## 2018-03-22 NOTE — Telephone Encounter (Signed)
Letter asking patient to call to schedule an appointment.

## 2018-03-22 NOTE — Telephone Encounter (Signed)
Last seen and last lipid 08/02/17

## 2018-04-01 ENCOUNTER — Other Ambulatory Visit: Payer: Self-pay | Admitting: Family Medicine

## 2018-04-01 DIAGNOSIS — E119 Type 2 diabetes mellitus without complications: Secondary | ICD-10-CM

## 2018-04-13 ENCOUNTER — Encounter: Payer: Self-pay | Admitting: Nurse Practitioner

## 2018-04-13 ENCOUNTER — Ambulatory Visit: Payer: Managed Care, Other (non HMO) | Admitting: Nurse Practitioner

## 2018-04-13 VITALS — BP 127/66 | HR 85 | Temp 97.6°F | Ht 65.0 in | Wt 186.0 lb

## 2018-04-13 DIAGNOSIS — E785 Hyperlipidemia, unspecified: Secondary | ICD-10-CM

## 2018-04-13 DIAGNOSIS — E119 Type 2 diabetes mellitus without complications: Secondary | ICD-10-CM

## 2018-04-13 DIAGNOSIS — Z23 Encounter for immunization: Secondary | ICD-10-CM | POA: Diagnosis not present

## 2018-04-13 DIAGNOSIS — C61 Malignant neoplasm of prostate: Secondary | ICD-10-CM

## 2018-04-13 DIAGNOSIS — Z794 Long term (current) use of insulin: Secondary | ICD-10-CM

## 2018-04-13 DIAGNOSIS — I10 Essential (primary) hypertension: Secondary | ICD-10-CM | POA: Diagnosis not present

## 2018-04-13 DIAGNOSIS — Z6832 Body mass index (BMI) 32.0-32.9, adult: Secondary | ICD-10-CM

## 2018-04-13 LAB — BAYER DCA HB A1C WAIVED: HB A1C: 6.8 % (ref <7.0)

## 2018-04-13 MED ORDER — FENOFIBRATE 145 MG PO TABS: ORAL_TABLET | ORAL | 1 refills | Status: DC

## 2018-04-13 MED ORDER — TAMSULOSIN HCL 0.4 MG PO CAPS: 0.4000 mg | ORAL_CAPSULE | Freq: Every day | ORAL | 1 refills | Status: DC

## 2018-04-13 MED ORDER — ATORVASTATIN CALCIUM 40 MG PO TABS: ORAL_TABLET | ORAL | 1 refills | Status: DC

## 2018-04-13 MED ORDER — SITAGLIPTIN PHOS-METFORMIN HCL 50-1000 MG PO TABS: 1.0000 | ORAL_TABLET | Freq: Two times a day (BID) | ORAL | 1 refills | Status: DC

## 2018-04-13 MED ORDER — LISINOPRIL-HYDROCHLOROTHIAZIDE 20-25 MG PO TABS: 2.0000 | ORAL_TABLET | Freq: Every day | ORAL | 1 refills | Status: DC

## 2018-04-13 MED ORDER — AMLODIPINE BESYLATE 5 MG PO TABS: ORAL_TABLET | ORAL | 1 refills | Status: DC

## 2018-04-13 NOTE — Patient Instructions (Signed)
Diabetes Mellitus and Nutrition When you have diabetes (diabetes mellitus), it is very important to have healthy eating habits because your blood sugar (glucose) levels are greatly affected by what you eat and drink. Eating healthy foods in the appropriate amounts, at about the same times every day, can help you:  Control your blood glucose.  Lower your risk of heart disease.  Improve your blood pressure.  Reach or maintain a healthy weight.  Every person with diabetes is different, and each person has different needs for a meal plan. Your health care provider may recommend that you work with a diet and nutrition specialist (dietitian) to make a meal plan that is best for you. Your meal plan may vary depending on factors such as:  The calories you need.  The medicines you take.  Your weight.  Your blood glucose, blood pressure, and cholesterol levels.  Your activity level.  Other health conditions you have, such as heart or kidney disease.  How do carbohydrates affect me? Carbohydrates affect your blood glucose level more than any other type of food. Eating carbohydrates naturally increases the amount of glucose in your blood. Carbohydrate counting is a method for keeping track of how many carbohydrates you eat. Counting carbohydrates is important to keep your blood glucose at a healthy level, especially if you use insulin or take certain oral diabetes medicines. It is important to know how many carbohydrates you can safely have in each meal. This is different for every person. Your dietitian can help you calculate how many carbohydrates you should have at each meal and for snack. Foods that contain carbohydrates include:  Bread, cereal, rice, pasta, and crackers.  Potatoes and corn.  Peas, beans, and lentils.  Milk and yogurt.  Fruit and juice.  Desserts, such as cakes, cookies, ice cream, and candy.  How does alcohol affect me? Alcohol can cause a sudden decrease in blood  glucose (hypoglycemia), especially if you use insulin or take certain oral diabetes medicines. Hypoglycemia can be a life-threatening condition. Symptoms of hypoglycemia (sleepiness, dizziness, and confusion) are similar to symptoms of having too much alcohol. If your health care provider says that alcohol is safe for you, follow these guidelines:  Limit alcohol intake to no more than 1 drink per day for nonpregnant women and 2 drinks per day for men. One drink equals 12 oz of beer, 5 oz of wine, or 1 oz of hard liquor.  Do not drink on an empty stomach.  Keep yourself hydrated with water, diet soda, or unsweetened iced tea.  Keep in mind that regular soda, juice, and other mixers may contain a lot of sugar and must be counted as carbohydrates.  What are tips for following this plan? Reading food labels  Start by checking the serving size on the label. The amount of calories, carbohydrates, fats, and other nutrients listed on the label are based on one serving of the food. Many foods contain more than one serving per package.  Check the total grams (g) of carbohydrates in one serving. You can calculate the number of servings of carbohydrates in one serving by dividing the total carbohydrates by 15. For example, if a food has 30 g of total carbohydrates, it would be equal to 2 servings of carbohydrates.  Check the number of grams (g) of saturated and trans fats in one serving. Choose foods that have low or no amount of these fats.  Check the number of milligrams (mg) of sodium in one serving. Most people   should limit total sodium intake to less than 2,300 mg per day.  Always check the nutrition information of foods labeled as "low-fat" or "nonfat". These foods may be higher in added sugar or refined carbohydrates and should be avoided.  Talk to your dietitian to identify your daily goals for nutrients listed on the label. Shopping  Avoid buying canned, premade, or processed foods. These  foods tend to be high in fat, sodium, and added sugar.  Shop around the outside edge of the grocery store. This includes fresh fruits and vegetables, bulk grains, fresh meats, and fresh dairy. Cooking  Use low-heat cooking methods, such as baking, instead of high-heat cooking methods like deep frying.  Cook using healthy oils, such as olive, canola, or sunflower oil.  Avoid cooking with butter, cream, or high-fat meats. Meal planning  Eat meals and snacks regularly, preferably at the same times every day. Avoid going long periods of time without eating.  Eat foods high in fiber, such as fresh fruits, vegetables, beans, and whole grains. Talk to your dietitian about how many servings of carbohydrates you can eat at each meal.  Eat 4-6 ounces of lean protein each day, such as lean meat, chicken, fish, eggs, or tofu. 1 ounce is equal to 1 ounce of meat, chicken, or fish, 1 egg, or 1/4 cup of tofu.  Eat some foods each day that contain healthy fats, such as avocado, nuts, seeds, and fish. Lifestyle   Check your blood glucose regularly.  Exercise at least 30 minutes 5 or more days each week, or as told by your health care provider.  Take medicines as told by your health care provider.  Do not use any products that contain nicotine or tobacco, such as cigarettes and e-cigarettes. If you need help quitting, ask your health care provider.  Work with a counselor or diabetes educator to identify strategies to manage stress and any emotional and social challenges. What are some questions to ask my health care provider?  Do I need to meet with a diabetes educator?  Do I need to meet with a dietitian?  What number can I call if I have questions?  When are the best times to check my blood glucose? Where to find more information:  American Diabetes Association: diabetes.org/food-and-fitness/food  Academy of Nutrition and Dietetics:  www.eatright.org/resources/health/diseases-and-conditions/diabetes  National Institute of Diabetes and Digestive and Kidney Diseases (NIH): www.niddk.nih.gov/health-information/diabetes/overview/diet-eating-physical-activity Summary  A healthy meal plan will help you control your blood glucose and maintain a healthy lifestyle.  Working with a diet and nutrition specialist (dietitian) can help you make a meal plan that is best for you.  Keep in mind that carbohydrates and alcohol have immediate effects on your blood glucose levels. It is important to count carbohydrates and to use alcohol carefully. This information is not intended to replace advice given to you by your health care provider. Make sure you discuss any questions you have with your health care provider. Document Released: 04/01/2005 Document Revised: 08/09/2016 Document Reviewed: 08/09/2016 Elsevier Interactive Patient Education  2018 Elsevier Inc.  

## 2018-04-13 NOTE — Progress Notes (Signed)
Subjective:    Patient ID: Brandon Elliott, male    DOB: 06-02-1956, 62 y.o.   MRN: [id number]   Chief Complaint: Medical management of chronic issues  HPI:  1. Essential hypertension  -does not check BP at home -usually runs 150s/95s -no c/o CP/SOB/HA -adds a lot of table salt to foods  2. Type 2 diabetes mellitus without complication, with long-term current use of insulin (HCC)  -does not check blood sugars at home -no symptoms of hypo or hyperglycemia -last A1C in Jan 2019 was 6.8  3. Prostate cancer [facility])  -s/p radioactive seeds in 2014 -no issues with starting or stopping stream -takes Flomax  4. Hyperlipidemia, unspecified hyperlipidemia type  -does not watch diet  5. BMI 32.0-32.9,adult  -no recent weight changes    Outpatient Encounter Medications as of 04/13/2018  Medication Sig  . amLODipine (NORVASC) 5 MG tablet TAKE 1 TABLET BY MOUTH ONCE DAILY NEEDS  OFFICE  VISIT  FOR  FURTHER  REFILLS  . atorvastatin (LIPITOR) 40 MG tablet TAKE 1 TABLET BY MOUTH ONCE DAILY IN THE MORNING  . fenofibrate (TRICOR) 145 MG tablet TAKE 1 TABLET BY MOUTH ONCE DAILY NEEDS  OFFICE  VISIT  BEFORE  NEXT  REFILL  . lisinopril-hydrochlorothiazide (PRINZIDE,ZESTORETIC) 20-25 MG tablet TAKE 2 TABLETS BY MOUTH ONCE DAILY  . sitaGLIPtin-metformin (JANUMET) 50-1000 MG tablet Take 1 tablet by mouth 2 (two) times daily with a meal.  . tamsulosin (FLOMAX) 0.4 MG CAPS capsule TAKE 1 CAPSULE BY MOUTH ONCE DAILY   No facility-administered encounter medications on file as of 04/13/2018.     New complaints: None today  Social history: Works for [employer]   Review of Systems  Constitutional: Negative for activity change, appetite change, chills, fatigue, fever and unexpected weight change.  HENT: Negative for congestion, ear pain, rhinorrhea, sinus pressure, sinus pain and sore throat.   Eyes: Negative for pain, redness and visual disturbance.  Respiratory: Negative for cough, chest  tightness, shortness of breath and wheezing.   Cardiovascular: Negative for chest pain, palpitations and leg swelling.  Gastrointestinal: Negative for abdominal pain, constipation, diarrhea, nausea and vomiting.  Endocrine: Negative for cold intolerance, heat intolerance, polydipsia, polyphagia and polyuria.  Genitourinary: Negative for difficulty urinating, dysuria and urgency.  Musculoskeletal: Negative for arthralgias, gait problem, joint swelling and myalgias.  Skin: Negative for rash and wound.  Allergic/Immunologic: Negative for environmental allergies and food allergies.  Neurological: Negative for dizziness, tremors, weakness and numbness.  Hematological: Does not bruise/bleed easily.  Psychiatric/Behavioral: Negative for confusion, decreased concentration, sleep disturbance and suicidal ideas. The patient is not nervous/anxious.        Objective:   Physical Exam  Constitutional: He is oriented to person, place, and time. He appears well-developed and well-nourished.  HENT:  Head: Normocephalic and atraumatic.  Right Ear: External ear normal.  Left Ear: External ear normal.  Nose: Nose normal.  Mouth/Throat: Oropharynx is clear and moist. No oropharyngeal exudate.  Eyes: Pupils are equal, round, and reactive to light. Conjunctivae and EOM are normal.  Neck: Normal range of motion. Neck supple. No thyromegaly present.  Cardiovascular: Normal rate, regular rhythm, normal heart sounds and intact distal pulses.  Pulmonary/Chest: Effort normal and breath sounds normal.  Abdominal: Soft. Bowel sounds are normal.  Musculoskeletal: Normal range of motion.  Neurological: He is alert and oriented to person, place, and time. He displays normal reflexes. No cranial nerve deficit.  Skin: Skin is warm and dry.  Psychiatric: He has a normal  mood and affect. His behavior is normal. Judgment and thought content normal.  Nursing note and vitals reviewed.  BP 127/66   Pulse 85   Temp 97.6 F  (36.4 C) (Oral)   Ht _0  (1.651 m)   Wt 186 lb (84.4 kg)   BMI 30.95 kg/m   A1C is 6.8 today    Assessment & Plan:  MARK HASSEY comes in today with chief complaint of Medical Management of Chronic Issues   Diagnosis and orders addressed:  1. Essential hypertension low sodium diet - CMP14+EGFR - lisinopril-hydrochlorothiazide (PRINZIDE,ZESTORETIC) 20-25 MG tablet; Take 2 tablets by mouth daily.  Dispense: 180 tablet; Refill: 1 - amLODipine (NORVASC) 5 MG tablet; TAKE 1 TABLET BY MOUTH ONCE DAILY NEEDS  OFFICE  VISIT  FOR  FURTHER  REFILLS  Dispense: 90 tablet; Refill: 1  2. Type 2 diabetes mellitus without complication, without long-term current use of insulin (HCC) Continue to watch carbs in diet - sitaGLIPtin-metformin (JANUMET) 50-1000 MG tablet; Take 1 tablet by mouth 2 (two) times daily with a meal.  Dispense: 180 tablet; Refill: 1 - Bayer DCA Hb A1c Waived  3. Prostate cancer ([place]) - tamsulosin (FLOMAX) 0.4 MG CAPS capsule; Take 1 capsule (0.4 mg total) by mouth daily.  Dispense: 90 capsule; Refill: 1  4. Hyperlipidemia, unspecified hyperlipidemia type Low fat diet - Lipid panel - fenofibrate (TRICOR) 145 MG tablet; TAKE 1 TABLET BY MOUTH ONCE DAILY NEEDS  OFFICE  VISIT  BEFORE  NEXT  REFILL  Dispense: 90 tablet; Refill: 1 - atorvastatin (LIPITOR) 40 MG tablet; TAKE 1 TABLET BY MOUTH ONCE DAILY IN THE MORNING  Dispense: 90 tablet; Refill: 1  5. BMI 32.0-32.9,adult Discussed diet and exercise for person with BMI >25 Will recheck weight in 3-6 months   Labs pending Health Maintenance reviewed Diet and exercise encouraged  Follow up plan: 3 months   Mary-Margaret Hassell Done, FNP

## 2018-04-14 LAB — CMP14+EGFR
A/G RATIO: 1.9 (ref 1.2–2.2)
ALT: 28 IU/L (ref 0–44)
AST: 21 IU/L (ref 0–40)
Albumin: 4.5 g/dL (ref 3.6–4.8)
Alkaline Phosphatase: 43 IU/L (ref 39–117)
BUN / CREAT RATIO: 15 (ref 10–24)
BUN: 17 mg/dL (ref 8–27)
Bilirubin Total: 0.6 mg/dL (ref 0.0–1.2)
CALCIUM: 10 mg/dL (ref 8.6–10.2)
CO2: 25 mmol/L (ref 20–29)
Chloride: 99 mmol/L (ref 96–106)
Creatinine, Ser: 1.16 mg/dL (ref 0.76–1.27)
GFR, EST AFRICAN AMERICAN: 77 mL/min/{1.73_m2} (ref >59)
GFR, EST NON AFRICAN AMERICAN: 66 mL/min/{1.73_m2} (ref >59)
GLOBULIN, TOTAL: 2.4 g/dL (ref 1.5–4.5)
Glucose: 142 mg/dL — ABNORMAL HIGH (ref 65–99)
POTASSIUM: 4.4 mmol/L (ref 3.5–5.2)
Sodium: 141 mmol/L (ref 134–144)
Total Protein: 6.9 g/dL (ref 6.0–8.5)

## 2018-04-14 LAB — LIPID PANEL
CHOL/HDL RATIO: 3.8 ratio (ref 0.0–5.0)
Cholesterol, Total: 147 mg/dL (ref 100–199)
HDL: 39 mg/dL — AB (ref >39)
LDL CALC: 76 mg/dL (ref 0–99)
Triglycerides: 161 mg/dL — ABNORMAL HIGH (ref 0–149)
VLDL CHOLESTEROL CAL: 32 mg/dL (ref 5–40)

## 2018-08-25 ENCOUNTER — Ambulatory Visit: Payer: Managed Care, Other (non HMO) | Admitting: Nurse Practitioner

## 2018-08-30 ENCOUNTER — Encounter: Payer: Self-pay | Admitting: Family Medicine

## 2018-09-13 ENCOUNTER — Ambulatory Visit: Payer: Managed Care, Other (non HMO) | Admitting: Nurse Practitioner

## 2018-09-13 NOTE — Progress Notes (Deleted)
   Subjective:    Patient ID: Brandon Elliott, male    DOB: 04-07-1956, 63 y.o.   MRN: [id number]   Chief Complaint:  Medical management of chronic issues  HPI:  1. Essential hypertension  No c/o chest pain, sob or headache. Does not check blood pressure at home. BP Readings from Last 3 Encounters:  04/13/18 127/66  08/02/17 128/76  01/14/16 136/76     2. Hyperlipidemia, unspecified hyperlipidemia type  Does not watch diet and does no exercise.  3. Type 2 diabetes mellitus without complication, with long-term current use of insulin (HCC)  Last hgba1c was 6.8%.  4. Prostate cancer ([place])  Was treated with radioactive seed implants in 2014  5. BMI 32.0-32.9,adult  No recent weight changes    Outpatient Encounter Medications as of 09/13/2018  Medication Sig  . amLODipine (NORVASC) 5 MG tablet TAKE 1 TABLET BY MOUTH ONCE DAILY NEEDS  OFFICE  VISIT  FOR  FURTHER  REFILLS  . atorvastatin (LIPITOR) 40 MG tablet TAKE 1 TABLET BY MOUTH ONCE DAILY IN THE MORNING  . fenofibrate (TRICOR) 145 MG tablet TAKE 1 TABLET BY MOUTH ONCE DAILY NEEDS  OFFICE  VISIT  BEFORE  NEXT  REFILL  . lisinopril-hydrochlorothiazide (PRINZIDE,ZESTORETIC) 20-25 MG tablet Take 2 tablets by mouth daily.  . sitaGLIPtin-metformin (JANUMET) 50-1000 MG tablet Take 1 tablet by mouth 2 (two) times daily with a meal.  . tamsulosin (FLOMAX) 0.4 MG CAPS capsule Take 1 capsule (0.4 mg total) by mouth daily.     New complaints: ***  Social history:    Review of Systems     Objective:   Physical Exam        Assessment & Plan:

## 2018-09-21 ENCOUNTER — Encounter: Payer: Self-pay | Admitting: Family Medicine

## 2018-09-26 ENCOUNTER — Telehealth: Payer: Self-pay | Admitting: Nurse Practitioner

## 2018-09-26 NOTE — Telephone Encounter (Signed)
Patient aware and appt made 

## 2018-09-26 NOTE — Telephone Encounter (Signed)
Pt. Needs to be seen for this. Thanks, WS 

## 2018-09-29 ENCOUNTER — Encounter: Payer: Self-pay | Admitting: Nurse Practitioner

## 2018-09-29 ENCOUNTER — Other Ambulatory Visit: Payer: Self-pay

## 2018-09-29 ENCOUNTER — Ambulatory Visit: Payer: Managed Care, Other (non HMO) | Admitting: Nurse Practitioner

## 2018-09-29 VITALS — BP 136/82 | HR 87 | Temp 98.7°F | Ht 65.0 in | Wt 179.8 lb

## 2018-09-29 DIAGNOSIS — I10 Essential (primary) hypertension: Secondary | ICD-10-CM | POA: Diagnosis not present

## 2018-09-29 DIAGNOSIS — E785 Hyperlipidemia, unspecified: Secondary | ICD-10-CM | POA: Diagnosis not present

## 2018-09-29 DIAGNOSIS — Z6832 Body mass index (BMI) 32.0-32.9, adult: Secondary | ICD-10-CM | POA: Diagnosis not present

## 2018-09-29 DIAGNOSIS — Z794 Long term (current) use of insulin: Secondary | ICD-10-CM

## 2018-09-29 DIAGNOSIS — E119 Type 2 diabetes mellitus without complications: Secondary | ICD-10-CM | POA: Diagnosis not present

## 2018-09-29 DIAGNOSIS — C61 Malignant neoplasm of prostate: Secondary | ICD-10-CM

## 2018-09-29 LAB — BAYER DCA HB A1C WAIVED: HB A1C (BAYER DCA - WAIVED): 6.1 % (ref <7.0)

## 2018-09-29 MED ORDER — FENOFIBRATE 145 MG PO TABS: ORAL_TABLET | ORAL | 1 refills | Status: DC

## 2018-09-29 MED ORDER — AMLODIPINE BESYLATE 5 MG PO TABS: ORAL_TABLET | ORAL | 1 refills | Status: DC

## 2018-09-29 MED ORDER — SITAGLIPTIN PHOS-METFORMIN HCL 50-1000 MG PO TABS: 1.0000 | ORAL_TABLET | Freq: Two times a day (BID) | ORAL | 1 refills | Status: DC

## 2018-09-29 MED ORDER — ATORVASTATIN CALCIUM 40 MG PO TABS: ORAL_TABLET | ORAL | 1 refills | Status: DC

## 2018-09-29 MED ORDER — TAMSULOSIN HCL 0.4 MG PO CAPS: 0.4000 mg | ORAL_CAPSULE | Freq: Every day | ORAL | 1 refills | Status: DC

## 2018-09-29 MED ORDER — LISINOPRIL-HYDROCHLOROTHIAZIDE 20-25 MG PO TABS: 2.0000 | ORAL_TABLET | Freq: Every day | ORAL | 1 refills | Status: DC

## 2018-09-29 NOTE — Patient Instructions (Signed)
Diabetes Mellitus and Foot Care  Foot care is an important part of your health, especially when you have diabetes. Diabetes may cause you to have problems because of poor blood flow (circulation) to your feet and legs, which can cause your skin to:   Become thinner and drier.   Break more easily.   Heal more slowly.   Peel and crack.  You may also have nerve damage (neuropathy) in your legs and feet, causing decreased feeling in them. This means that you may not notice minor injuries to your feet that could lead to more serious problems. Noticing and addressing any potential problems early is the best way to prevent future foot problems.  How to care for your feet  Foot hygiene   Wash your feet daily with warm water and mild soap. Do not use hot water. Then, pat your feet and the areas between your toes until they are completely dry. Do not soak your feet as this can dry your skin.   Trim your toenails straight across. Do not dig under them or around the cuticle. File the edges of your nails with an emery board or nail file.   Apply a moisturizing lotion or petroleum jelly to the skin on your feet and to dry, brittle toenails. Use lotion that does not contain alcohol and is unscented. Do not apply lotion between your toes.  Shoes and socks   Wear clean socks or stockings every day. Make sure they are not too tight. Do not wear knee-high stockings since they may decrease blood flow to your legs.   Wear shoes that fit properly and have enough cushioning. Always look in your shoes before you put them on to be sure there are no objects inside.   To break in new shoes, wear them for just a few hours a day. This prevents injuries on your feet.  Wounds, scrapes, corns, and calluses   Check your feet daily for blisters, cuts, bruises, sores, and redness. If you cannot see the bottom of your feet, use a mirror or ask someone for help.   Do not cut corns or calluses or try to remove them with medicine.   If you  find a minor scrape, cut, or break in the skin on your feet, keep it and the skin around it clean and dry. You may clean these areas with mild soap and water. Do not clean the area with peroxide, alcohol, or iodine.   If you have a wound, scrape, corn, or callus on your foot, look at it several times a day to make sure it is healing and not infected. Check for:  ? Redness, swelling, or pain.  ? Fluid or blood.  ? Warmth.  ? Pus or a bad smell.  General instructions   Do not cross your legs. This may decrease blood flow to your feet.   Do not use heating pads or hot water bottles on your feet. They may burn your skin. If you have lost feeling in your feet or legs, you may not know this is happening until it is too late.   Protect your feet from hot and cold by wearing shoes, such as at the beach or on hot pavement.   Schedule a complete foot exam at least once a year (annually) or more often if you have foot problems. If you have foot problems, report any cuts, sores, or bruises to your health care provider immediately.  Contact a health care provider if:     You have a medical condition that increases your risk of infection and you have any cuts, sores, or bruises on your feet.   You have an injury that is not healing.   You have redness on your legs or feet.   You feel burning or tingling in your legs or feet.   You have pain or cramps in your legs and feet.   Your legs or feet are numb.   Your feet always feel cold.   You have pain around a toenail.  Get help right away if:   You have a wound, scrape, corn, or callus on your foot and:  ? You have pain, swelling, or redness that gets worse.  ? You have fluid or blood coming from the wound, scrape, corn, or callus.  ? Your wound, scrape, corn, or callus feels warm to the touch.  ? You have pus or a bad smell coming from the wound, scrape, corn, or callus.  ? You have a fever.  ? You have a red line going up your leg.  Summary   Check your feet every day  for cuts, sores, red spots, swelling, and blisters.   Moisturize feet and legs daily.   Wear shoes that fit properly and have enough cushioning.   If you have foot problems, report any cuts, sores, or bruises to your health care provider immediately.   Schedule a complete foot exam at least once a year (annually) or more often if you have foot problems.  This information is not intended to replace advice given to you by your health care provider. Make sure you discuss any questions you have with your health care provider.  Document Released: 07/02/2000 Document Revised: 08/17/2017 Document Reviewed: 08/06/2016  Elsevier Interactive Patient Education  2019 Elsevier Inc.

## 2018-09-29 NOTE — Progress Notes (Signed)
Subjective:    Patient ID: Brandon Elliott, male    DOB: 1956/07/01, 63 y.o.   MRN: [id number]   Chief Complaint: Medical Management of Chronic Issues   HPI:  1. Essential hypertension  No c/o chest pain, sob or headache. Does not check blood pressure at home. BP Readings from Last 3 Encounters:  09/29/18 136/82  04/13/18 127/66  08/02/17 128/76     2. Type 2 diabetes mellitus without complication, with long-term current use of insulin (HCC) Last hgba1c was  6.8%. he does not check blood sugars at home.  3. Hyperlipidemia, unspecified hyperlipidemia type  Not watching diet and does very little exercise  4. BMI 32.0-32.9,adult  No recent weight changes    Outpatient Encounter Medications as of 09/29/2018  Medication Sig  . amLODipine (NORVASC) 5 MG tablet TAKE 1 TABLET BY MOUTH ONCE DAILY NEEDS  OFFICE  VISIT  FOR  FURTHER  REFILLS  . atorvastatin (LIPITOR) 40 MG tablet TAKE 1 TABLET BY MOUTH ONCE DAILY IN THE MORNING  . fenofibrate (TRICOR) 145 MG tablet TAKE 1 TABLET BY MOUTH ONCE DAILY NEEDS  OFFICE  VISIT  BEFORE  NEXT  REFILL  . lisinopril-hydrochlorothiazide (PRINZIDE,ZESTORETIC) 20-25 MG tablet Take 2 tablets by mouth daily.  . sitaGLIPtin-metformin (JANUMET) 50-1000 MG tablet Take 1 tablet by mouth 2 (two) times daily with a meal.  . tamsulosin (FLOMAX) 0.4 MG CAPS capsule Take 1 capsule (0.4 mg total) by mouth daily.      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  HENT: Negative.   Eyes: Negative for pain.  Respiratory: Negative for shortness of breath.   Cardiovascular: Negative for chest pain, palpitations and leg swelling.  Gastrointestinal: Negative for abdominal pain.  Endocrine: Negative for polydipsia.  Genitourinary: Negative.   Skin: Negative for rash.  Neurological: Negative for dizziness, weakness and  headaches.  Hematological: Does not bruise/bleed easily.  Psychiatric/Behavioral: Negative.   All other systems reviewed and are negative.      Objective:   Physical Exam Vitals signs and nursing note reviewed.  Constitutional:      Appearance: Normal appearance. He is well-developed.  HENT:     Head: Normocephalic.     Nose: Nose normal.  Eyes:     Pupils: Pupils are equal, round, and reactive to light.  Neck:     Musculoskeletal: Normal range of motion and neck supple.     Thyroid: No thyroid mass or thyromegaly.     Vascular: No carotid bruit or JVD.     Trachea: Phonation normal.  Cardiovascular:     Rate and Rhythm: Normal rate and regular rhythm.  Pulmonary:     Effort: Pulmonary effort is normal. No respiratory distress.     Breath sounds: Normal breath sounds.  Abdominal:     General: Bowel sounds are normal.     Palpations: Abdomen is soft.     Tenderness: There is no abdominal tenderness.  Musculoskeletal: Normal range of motion.  Lymphadenopathy:     Cervical: No cervical adenopathy.  Skin:    General: Skin is warm and dry.  Neurological:     Mental Status: He is alert and oriented to person, place, and time.  Psychiatric:        Behavior: Behavior normal.        Thought Content: Thought content normal.  Judgment: Judgment normal.    BP 136/82   Pulse 87   Temp 98.7 F (37.1 C) (Oral)   Ht 5' 5" (1.651 m)   Wt 179 lb 12.8 oz (81.6 kg)   BMI 29.92 kg/m  hgba1c 6.1%    Assessment & Plan:  Brandon Elliott comes in today with chief complaint of Medical Management of Chronic Issues   Diagnosis and orders addressed:  1. Essential hypertension Low sodium diet - CMP14+EGFR - Microalbumin / creatinine urine ratio - Bayer DCA Hb A1c Waived - lisinopril-hydrochlorothiazide (PRINZIDE,ZESTORETIC) 20-25 MG tablet; Take 2 tablets by mouth daily.  Dispense: 180 tablet; Refill: 1 - amLODipine (NORVASC) 5 MG tablet; TAKE 1 TABLET BY MOUTH ONCE DAILY  NEEDS  OFFICE  VISIT  FOR  FURTHER  REFILLS  Dispense: 90 tablet; Refill: 1  2. Type 2 diabetes mellitus without complication, without long-term current use of insulin (HCC) - CMP14+EGFR - Microalbumin / creatinine urine ratio - Bayer DCA Hb A1c Waived - sitaGLIPtin-metformin (JANUMET) 50-1000 MG tablet; Take 1 tablet by mouth 2 (two) times daily with a meal.  Dispense: 180 tablet; Refill: 1 continue to watch carbs in diet - CMP14+EGFR - Microalbumin / creatinine urine ratio - Bayer DCA Hb A1c Waived  3. Hyperlipidemia, unspecified hyperlipidemia type Low fat diet - CMP14+EGFR - Lipid panel - Microalbumin / creatinine urine ratio - Bayer DCA Hb A1c Waived - fenofibrate (TRICOR) 145 MG tablet; TAKE 1 TABLET BY MOUTH ONCE DAILY NEEDS  OFFICE  VISIT  BEFORE  NEXT  REFILL  Dispense: 90 tablet; Refill: 1 - atorvastatin (LIPITOR) 40 MG tablet; TAKE 1 TABLET BY MOUTH ONCE DAILY IN THE MORNING  Dispense: 90 tablet; Refill: 1  4.  BMI 32.0-32.9,adult Discussed diet and exercise for person with BMI >25 Will recheck weight in 3-6 months  6. Prostate cancer (HCC) - tamsulosin (FLOMAX) 0.4 MG CAPS capsule; Take 1 capsule (0.4 mg total) by mouth daily.  Dispense: 90 capsule; Refill: 1   Labs pending Health Maintenance reviewed Diet and exercise encouraged  Follow up plan: 6 months   Mary-Margaret Martin, FNP  

## 2018-09-30 LAB — LIPID PANEL
Chol/HDL Ratio: 2.3 ratio (ref 0.0–5.0)
Cholesterol, Total: 114 mg/dL (ref 100–199)
HDL: 49 mg/dL (ref >39)
LDL Calculated: 53 mg/dL (ref 0–99)
Triglycerides: 60 mg/dL (ref 0–149)
VLDL Cholesterol Cal: 12 mg/dL (ref 5–40)

## 2018-09-30 LAB — MICROALBUMIN / CREATININE URINE RATIO
Creatinine, Urine: 110.6 mg/dL
Microalb/Creat Ratio: 66 mg/g creat — ABNORMAL HIGH (ref 0–29)
Microalbumin, Urine: 72.6 ug/mL

## 2018-09-30 LAB — CMP14+EGFR
A/G RATIO: 2.3 — AB (ref 1.2–2.2)
ALT: 24 IU/L (ref 0–44)
AST: 26 IU/L (ref 0–40)
Albumin: 4.6 g/dL (ref 3.8–4.8)
Alkaline Phosphatase: 59 IU/L (ref 39–117)
BILIRUBIN TOTAL: 0.5 mg/dL (ref 0.0–1.2)
BUN / CREAT RATIO: 12 (ref 10–24)
BUN: 14 mg/dL (ref 8–27)
CHLORIDE: 101 mmol/L (ref 96–106)
CO2: 24 mmol/L (ref 20–29)
Calcium: 9.8 mg/dL (ref 8.6–10.2)
Creatinine, Ser: 1.13 mg/dL (ref 0.76–1.27)
GFR calc non Af Amer: 68 mL/min/{1.73_m2} (ref >59)
GFR, EST AFRICAN AMERICAN: 79 mL/min/{1.73_m2} (ref >59)
Globulin, Total: 2 g/dL (ref 1.5–4.5)
Glucose: 113 mg/dL — ABNORMAL HIGH (ref 65–99)
POTASSIUM: 4.5 mmol/L (ref 3.5–5.2)
Sodium: 144 mmol/L (ref 134–144)
TOTAL PROTEIN: 6.6 g/dL (ref 6.0–8.5)

## 2018-12-19 ENCOUNTER — Ambulatory Visit: Payer: Managed Care, Other (non HMO) | Admitting: Nurse Practitioner

## 2018-12-19 ENCOUNTER — Other Ambulatory Visit: Payer: Self-pay

## 2018-12-19 ENCOUNTER — Encounter: Payer: Self-pay | Admitting: Nurse Practitioner

## 2018-12-19 VITALS — BP 136/81 | HR 92 | Temp 98.3°F | Ht 65.0 in | Wt 179.4 lb

## 2018-12-19 DIAGNOSIS — J069 Acute upper respiratory infection, unspecified: Secondary | ICD-10-CM | POA: Diagnosis not present

## 2018-12-19 NOTE — Patient Instructions (Signed)

## 2018-12-19 NOTE — Progress Notes (Signed)
Subjective:    Patient ID: Brandon Elliott, male    DOB: 1955/08/24, 63 y.o.   MRN: [id number]   Chief Complaint: work note (Patient states he was on vacation and work states he needs a work note )   HPI Patient was on vacation last week. When he got back he was not feeling well so he did not go back to work the day he was suppose to. Now work will not let him return without a doctors note.   Review of Systems  Constitutional: Negative.  Negative for activity change and appetite change.  HENT: Negative.   Eyes: Negative for pain.  Respiratory: Negative for shortness of breath.   Cardiovascular: Negative for chest pain, palpitations and leg swelling.  Gastrointestinal: Negative for abdominal pain.  Endocrine: Negative for polydipsia.  Genitourinary: Negative.   Skin: Negative for rash.  Neurological: Negative for dizziness, weakness and headaches.  Hematological: Does not bruise/bleed easily.  Psychiatric/Behavioral: Negative.   All other systems reviewed and are negative.      Objective:   Physical Exam Vitals signs and nursing note reviewed.  Constitutional:      Appearance: Normal appearance. He is well-developed.  HENT:     Head: Normocephalic.     Nose: Nose normal.  Eyes:     Pupils: Pupils are equal, round, and reactive to light.  Neck:     Musculoskeletal: Normal range of motion and neck supple.     Thyroid: No thyroid mass or thyromegaly.     Vascular: No carotid bruit or JVD.     Trachea: Phonation normal.  Cardiovascular:     Rate and Rhythm: Normal rate and regular rhythm.  Pulmonary:     Effort: Pulmonary effort is normal. No respiratory distress.     Breath sounds: Normal breath sounds.  Abdominal:     General: Bowel sounds are normal.     Palpations: Abdomen is soft.     Tenderness: There is no abdominal tenderness.  Musculoskeletal: Normal range of motion.  Lymphadenopathy:     Cervical: No cervical adenopathy.  Skin:    General: Skin is warm  and dry.  Neurological:     Mental Status: He is alert and oriented to person, place, and time.  Psychiatric:        Behavior: Behavior normal.        Thought Content: Thought content normal.        Judgment: Judgment normal.     BP 136/81   Pulse 92   Temp 98.3 F (36.8 C) (Oral)   Ht 5\' 5"  (1.651 m)   Wt 179 lb 6.4 oz (81.4 kg)   BMI 29.85 kg/m        Assessment & Plan:  Leafy Kindle Bunte in today with chief complaint of work note (Patient states he was on vacation and work states he needs a work note )   1. Viral upper respiratory tract infection 1. Take meds as prescribed 2. Use a cool mist humidifier especially during the winter months and when heat has been humid. 3. Use saline nose sprays frequently 4. Saline irrigations of the nose can be very helpful if done frequently.  * 4X daily for 1 week*  * Use of a nettie pot can be helpful with this. Follow directions with this* 5. Drink plenty of fluids 6. Keep thermostat turn down low 7.For any cough or congestion  Use plain Mucinex- regular strength or max strength is fine   * Children-  consult with Pharmacist for dosing 8. For fever or aces or pains- take tylenol or ibuprofen appropriate for age and weight.  * for fevers greater than 101 orally you may alternate ibuprofen and tylenol every  3 hours.   Mary-Margaret Hassell Done, FNP

## 2019-02-01 ENCOUNTER — Ambulatory Visit (INDEPENDENT_AMBULATORY_CARE_PROVIDER_SITE_OTHER): Payer: Managed Care, Other (non HMO) | Admitting: Nurse Practitioner

## 2019-02-01 ENCOUNTER — Encounter: Payer: Self-pay | Admitting: Nurse Practitioner

## 2019-02-01 ENCOUNTER — Other Ambulatory Visit: Payer: Self-pay

## 2019-02-01 DIAGNOSIS — R509 Fever, unspecified: Secondary | ICD-10-CM

## 2019-02-01 DIAGNOSIS — J069 Acute upper respiratory infection, unspecified: Secondary | ICD-10-CM | POA: Diagnosis not present

## 2019-02-01 NOTE — Progress Notes (Signed)
   Virtual Visit via telephone Note  I connected with Brandon Elliott on 02/01/19 at 12.:55 by telephone and verified that I am speaking with the correct person using two identifiers. Brandon Elliott is currently located at home and no one is currently with her during visit. The provider, Mary-Margaret Hassell Done, FNP is located in their office at time of visit.  I discussed the limitations, risks, security and privacy concerns of performing an evaluation and management service by telephone and the availability of in person appointments. I also discussed with the patient that there may be a patient responsible charge related to this service. The patient expressed understanding and agreed to proceed.   History and Present Illness:   Chief Complaint: Fever   HPI Patient calls in today for televisit c/o fever. Fever ranging 98.7 to 101. Has had some congestion and runny nose. Slight headache.  He does not believe he has an exposure to covid. Says he is a lot better today.    Review of Systems  Constitutional: Negative for diaphoresis and weight loss.  Eyes: Negative for blurred vision, double vision and pain.  Respiratory: Negative for shortness of breath.   Cardiovascular: Negative for chest pain, palpitations, orthopnea and leg swelling.  Gastrointestinal: Negative for abdominal pain.  Skin: Negative for rash.  Neurological: Negative for dizziness, sensory change, loss of consciousness, weakness and headaches.  Endo/Heme/Allergies: Negative for polydipsia. Does not bruise/bleed easily.  Psychiatric/Behavioral: Negative for memory loss. The patient does not have insomnia.   All other systems reviewed and are negative.    Observations/Objective: Alert and oriented- answers all questions apprpropiately No distress.  Assessment and Plan: Brandon Elliott in today with chief complaint of Fever   1. Viral upper respiratory tract infection 2. Intermittent fever Since does not have fever today  we will just wait and treat symptoms with OTC meds. If fever returns tonight then we will have him go for covid testing tomorrow.   Follow Up Instructions: prn    I discussed the assessment and treatment plan with the patient. The patient was provided an opportunity to ask questions and all were answered. The patient agreed with the plan and demonstrated an understanding of the instructions.   The patient was advised to call back or seek an in-person evaluation if the symptoms worsen or if the condition fails to improve as anticipated.  The above assessment and management plan was discussed with the patient. The patient verbalized understanding of and has agreed to the management plan. Patient is aware to call the clinic if symptoms persist or worsen. Patient is aware when to return to the clinic for a follow-up visit. Patient educated on when it is appropriate to go to the emergency department.   Time call ended:  1:00 PM  I provided 10 minutes of non-face-to-face time during this encounter.    Mary-Margaret Hassell Done, FNP

## 2019-02-02 ENCOUNTER — Other Ambulatory Visit: Payer: Self-pay

## 2019-02-02 ENCOUNTER — Other Ambulatory Visit: Payer: Managed Care, Other (non HMO)

## 2019-02-02 ENCOUNTER — Telehealth: Payer: Self-pay | Admitting: Nurse Practitioner

## 2019-02-02 DIAGNOSIS — J069 Acute upper respiratory infection, unspecified: Secondary | ICD-10-CM

## 2019-02-02 NOTE — Telephone Encounter (Signed)
Pt is running intermittent fever with cough and congestion Covid test ordered per protocol Pt notified to go to [place] test site [place] understanding

## 2019-02-02 NOTE — Telephone Encounter (Signed)
Patient was told to call back this morning and let MMM know his temp. The took it with a touch thermometer on the forehead and it was 99.1.

## 2019-02-06 ENCOUNTER — Telehealth: Payer: Self-pay | Admitting: Nurse Practitioner

## 2019-02-06 LAB — NOVEL CORONAVIRUS, NAA: SARS-CoV-2, NAA: NOT DETECTED

## 2019-02-06 NOTE — Telephone Encounter (Signed)
Faxed note to [employer]. Wife and patient notified

## 2019-02-07 ENCOUNTER — Telehealth: Payer: Self-pay | Admitting: Nurse Practitioner

## 2019-02-07 NOTE — Telephone Encounter (Signed)
Ok to provide return to work note

## 2019-02-07 NOTE — Telephone Encounter (Signed)
Note written and faxed - pt aware

## 2019-02-07 NOTE — Telephone Encounter (Signed)
Pt aware test was neg

## 2019-03-20 ENCOUNTER — Other Ambulatory Visit: Payer: Self-pay

## 2019-03-20 ENCOUNTER — Telehealth: Payer: Self-pay | Admitting: Nurse Practitioner

## 2019-03-20 ENCOUNTER — Ambulatory Visit (INDEPENDENT_AMBULATORY_CARE_PROVIDER_SITE_OTHER): Payer: Managed Care, Other (non HMO) | Admitting: Family Medicine

## 2019-03-20 ENCOUNTER — Encounter: Payer: Self-pay | Admitting: Family Medicine

## 2019-03-20 DIAGNOSIS — J029 Acute pharyngitis, unspecified: Secondary | ICD-10-CM

## 2019-03-20 MED ORDER — AMOXICILLIN 875 MG PO TABS: 875.0000 mg | ORAL_TABLET | Freq: Two times a day (BID) | ORAL | 0 refills | Status: DC

## 2019-03-20 NOTE — Progress Notes (Signed)
Virtual Visit via telephone Note Due to COVID-19 pandemic this visit was conducted virtually. This visit type was conducted due to national recommendations for restrictions regarding the COVID-19 Pandemic (e.g. social distancing, sheltering in place) in an effort to limit this patient's exposure and mitigate transmission in our community. All issues noted in this document were discussed and addressed.  A physical exam was not performed with this format.   I connected with Brandon Elliott on 03/20/19 at 1330 by telephone and verified that I am speaking with the correct person using two identifiers. Brandon Elliott is currently located at work and no one is currently with them during visit. The provider, Monia Pouch, FNP is located in their office at time of visit.  I discussed the limitations, risks, security and privacy concerns of performing an evaluation and management service by telephone and the availability of in person appointments. I also discussed with the patient that there may be a patient responsible charge related to this service. The patient expressed understanding and agreed to proceed.  Subjective:  Patient ID: Brandon Elliott, male    DOB: 08-28-1955, 63 y.o.   MRN: [id number]  Chief Complaint:  Sore Throat   HPI: Brandon Elliott is a 63 y.o. male presenting on 03/20/2019 for Sore Throat   Pt reports ongoing sore throat with redness and swelling of tonsils. Onset 5 days ago. He has chills and low grade fever with the pain. No known sick exposures.   Sore Throat  This is a new problem. The current episode started in the past 7 days. The problem has been gradually worsening. The pain is worse on the left side. The maximum temperature recorded prior to his arrival was 100.4 - 100.9 F. The pain is at a severity of 5/10. The pain is moderate. Associated symptoms include headaches, swollen glands and trouble swallowing. Pertinent negatives include no abdominal pain, congestion, coughing,  diarrhea, drooling, ear discharge, ear pain, hoarse voice, plugged ear sensation, neck pain, shortness of breath, stridor or vomiting. He has tried acetaminophen for the symptoms. The treatment provided no relief.     Relevant past medical, surgical, family, and social history reviewed and updated as indicated.  Allergies and medications reviewed and updated.   Past Medical History:  Diagnosis Date  . Bronchitis    DX 10-03-2012--  ON ANTIBIOTICS  . DM (diabetes mellitus), type 2 ([place])   . History of BPH    W/ BOO  . History of gastric ulcer 2010  . Hyperlipidemia   . Hypertension   . Prostate cancer ([place]) 06/29/12   Adenocarcinoma,gleason=3+4=7,& 3+3=6,PSA=4.23,volume=38cc    Past Surgical History:  Procedure Laterality Date  . RADIOACTIVE SEED IMPLANT N/A 10/16/2012   Procedure: RADIOACTIVE SEED IMPLANT;  Surgeon: Bernestine Amass, MD;  Location: [name];  Service: Urology;  Laterality: N/A;  40 seeds implanted no seeds found in bladder     Social History   Socioeconomic History  . Marital status: Single    Spouse name: Not on file  . Number of children: 4  . Years of education: Not on file  . Highest education level: Not on file  Occupational History  . Occupation: truck Education administrator: [employer]  Social Needs  . Financial resource strain: Not on file  . Food insecurity    Worry: Not on file    Inability: Not on file  . Transportation needs    Medical: Not on file  Non-medical: Not on file  Tobacco Use  . Smoking status: Current Every Day Smoker    Packs/day: 0.50    Years: 30.00    Pack years: 15.00    Types: Cigarettes  . Smokeless tobacco: Never Used  Substance and Sexual Activity  . Alcohol use: Yes    Alcohol/week: 35.0 standard drinks    Types: 35 Cans of beer per week    Comment: 5-6 cans beer per day  . Drug use: No  . Sexual activity: Yes  Lifestyle  . Physical activity    Days per week: Not on file    Minutes  per session: Not on file  . Stress: Not on file  Relationships  . Social Herbalist on phone: Not on file    Gets together: Not on file    Attends religious service: Not on file    Active member of club or organization: Not on file    Attends meetings of clubs or organizations: Not on file    Relationship status: Not on file  . Intimate partner violence    Fear of current or ex partner: Not on file    Emotionally abused: Not on file    Physically abused: Not on file    Forced sexual activity: Not on file  Other Topics Concern  . Not on file  Social History Narrative  . Not on file    Outpatient Encounter Medications as of 03/20/2019  Medication Sig  . amLODipine (NORVASC) 5 MG tablet TAKE 1 TABLET BY MOUTH ONCE DAILY NEEDS  OFFICE  VISIT  FOR  FURTHER  REFILLS  . amoxicillin (AMOXIL) 875 MG tablet Take 1 tablet (875 mg total) by mouth 2 (two) times daily. 1 po BID  . atorvastatin (LIPITOR) 40 MG tablet TAKE 1 TABLET BY MOUTH ONCE DAILY IN THE MORNING  . fenofibrate (TRICOR) 145 MG tablet TAKE 1 TABLET BY MOUTH ONCE DAILY NEEDS  OFFICE  VISIT  BEFORE  NEXT  REFILL  . lisinopril-hydrochlorothiazide (PRINZIDE,ZESTORETIC) 20-25 MG tablet Take 2 tablets by mouth daily.  . sitaGLIPtin-metformin (JANUMET) 50-1000 MG tablet Take 1 tablet by mouth 2 (two) times daily with a meal.  . tamsulosin (FLOMAX) 0.4 MG CAPS capsule Take 1 capsule (0.4 mg total) by mouth daily.   No facility-administered encounter medications on file as of 03/20/2019.     No Known Allergies  Review of Systems  Constitutional: Positive for chills and fever. Negative for activity change, appetite change, diaphoresis, fatigue and unexpected weight change.  HENT: Positive for sore throat and trouble swallowing. Negative for congestion, drooling, ear discharge, ear pain, hoarse voice, postnasal drip, rhinorrhea, sinus pressure, sinus pain, sneezing, tinnitus and voice change.   Respiratory: Negative for cough,  choking, shortness of breath and stridor.   Cardiovascular: Negative for chest pain and palpitations.  Gastrointestinal: Negative for abdominal pain, diarrhea and vomiting.  Genitourinary: Negative for decreased urine volume and difficulty urinating.  Musculoskeletal: Negative for arthralgias, myalgias and neck pain.  Neurological: Positive for headaches. Negative for dizziness, weakness and light-headedness.  Psychiatric/Behavioral: Negative for confusion.  All other systems reviewed and are negative.        Observations/Objective: No vital signs or physical exam, this was a telephone or virtual health encounter.  Pt alert and oriented, answers all questions appropriately, and able to speak in full sentences.    Assessment and Plan: Santino was seen today for sore throat.  Diagnoses and all orders for this visit:  Acute  infective pharyngitis Reported symptoms consistent with acute bacterial pharyngitis. Pt has centor score of 4, so will treat with below. Symptomatic care discussed. Pt aware to report any new or worsening symptoms. Tylenol for fever and pain control. Throat lozenges as needed for pain. Salt water gargles if beneficial.  -     amoxicillin (AMOXIL) 875 MG tablet; Take 1 tablet (875 mg total) by mouth 2 (two) times daily. 1 po BID     Follow Up Instructions: Return if symptoms worsen or fail to improve.    I discussed the assessment and treatment plan with the patient. The patient was provided an opportunity to ask questions and all were answered. The patient agreed with the plan and demonstrated an understanding of the instructions.   The patient was advised to call back or seek an in-person evaluation if the symptoms worsen or if the condition fails to improve as anticipated.  The above assessment and management plan was discussed with the patient. The patient verbalized understanding of and has agreed to the management plan. Patient is aware to call the clinic if  symptoms persist or worsen. Patient is aware when to return to the clinic for a follow-up visit. Patient educated on when it is appropriate to go to the emergency department.    I provided 15 minutes of non-face-to-face time during this encounter. The call started at 1330. The call ended at 1345. The other time was used for coordination of care.    Monia Pouch, FNP-C [facility] [address] [place], [place] [address] [phone] 03/20/19

## 2019-04-02 ENCOUNTER — Encounter: Payer: Self-pay | Admitting: Nurse Practitioner

## 2019-04-02 ENCOUNTER — Ambulatory Visit (INDEPENDENT_AMBULATORY_CARE_PROVIDER_SITE_OTHER): Payer: Managed Care, Other (non HMO) | Admitting: Nurse Practitioner

## 2019-04-02 DIAGNOSIS — E119 Type 2 diabetes mellitus without complications: Secondary | ICD-10-CM

## 2019-04-02 DIAGNOSIS — I10 Essential (primary) hypertension: Secondary | ICD-10-CM

## 2019-04-02 DIAGNOSIS — E785 Hyperlipidemia, unspecified: Secondary | ICD-10-CM

## 2019-04-02 DIAGNOSIS — Z794 Long term (current) use of insulin: Secondary | ICD-10-CM

## 2019-04-02 DIAGNOSIS — Z6832 Body mass index (BMI) 32.0-32.9, adult: Secondary | ICD-10-CM

## 2019-04-02 DIAGNOSIS — C61 Malignant neoplasm of prostate: Secondary | ICD-10-CM

## 2019-04-02 MED ORDER — LISINOPRIL-HYDROCHLOROTHIAZIDE 20-25 MG PO TABS: 2.0000 | ORAL_TABLET | Freq: Every day | ORAL | 1 refills | Status: DC

## 2019-04-02 MED ORDER — FENOFIBRATE 145 MG PO TABS: ORAL_TABLET | ORAL | 1 refills | Status: DC

## 2019-04-02 MED ORDER — TAMSULOSIN HCL 0.4 MG PO CAPS: 0.4000 mg | ORAL_CAPSULE | Freq: Every day | ORAL | 1 refills | Status: DC

## 2019-04-02 MED ORDER — ATORVASTATIN CALCIUM 40 MG PO TABS: ORAL_TABLET | ORAL | 1 refills | Status: DC

## 2019-04-02 MED ORDER — JANUMET 50-1000 MG PO TABS: 1.0000 | ORAL_TABLET | Freq: Two times a day (BID) | ORAL | 1 refills | Status: DC

## 2019-04-02 MED ORDER — AMLODIPINE BESYLATE 5 MG PO TABS: ORAL_TABLET | ORAL | 1 refills | Status: DC

## 2019-04-02 NOTE — Progress Notes (Signed)
Virtual Visit via telephone Note Due to COVID-19 pandemic this visit was conducted virtually. This visit type was conducted due to national recommendations for restrictions regarding the COVID-19 Pandemic (e.g. social distancing, sheltering in place) in an effort to limit this patient's exposure and mitigate transmission in our community. All issues noted in this document were discussed and addressed.  A physical exam was not performed with this format.  I connected with Brandon Elliott on 04/02/19 at 4:10 by telephone and verified that I am speaking with the correct person using two identifiers. Brandon Elliott is currently located at home and no one is currently with him during visit. The provider, Mary-Margaret Hassell Done, FNP is located in their office at time of visit.  I discussed the limitations, risks, security and privacy concerns of performing an evaluation and management service by telephone and the availability of in person appointments. I also discussed with the patient that there may be a patient responsible charge related to this service. The patient expressed understanding and agreed to proceed.   History and Present Illness:   Chief Complaint: Medical Management of Chronic Issues    HPI:  1. Essential hypertension No c/o chest pain, sob or headache. He does not check blood pressure at home. BP Readings from Last 3 Encounters:  12/19/18 136/81  09/29/18 136/82  04/13/18 127/66     2. Hyperlipidemia, unspecified hyperlipidemia type Has not been watching diet and does very little exercise. He says he stays very active.  3. Type 2 diabetes mellitus without complication, with long-term current use of insulin ([place]) Has not been watching diet very closely.  Lab Results  Component Value Date   HGBA1C 6.1 09/29/2018     4. BMI 32.0-32.9,adult No recent weight changes Wt Readings from Last 3 Encounters:  12/19/18 179 lb 6.4 oz (81.4 kg)  09/29/18 179 lb 12.8 oz (81.6 kg)   04/13/18 186 lb (84.4 kg)   BMI Readings from Last 3 Encounters:  12/19/18 29.85 kg/m  09/29/18 29.92 kg/m  04/13/18 30.95 kg/m      Outpatient Encounter Medications as of 04/02/2019  Medication Sig  . amLODipine (NORVASC) 5 MG tablet TAKE 1 TABLET BY MOUTH ONCE DAILY NEEDS  OFFICE  VISIT  FOR  FURTHER  REFILLS  . amoxicillin (AMOXIL) 875 MG tablet Take 1 tablet (875 mg total) by mouth 2 (two) times daily. 1 po BID  . atorvastatin (LIPITOR) 40 MG tablet TAKE 1 TABLET BY MOUTH ONCE DAILY IN THE MORNING  . fenofibrate (TRICOR) 145 MG tablet TAKE 1 TABLET BY MOUTH ONCE DAILY NEEDS  OFFICE  VISIT  BEFORE  NEXT  REFILL  . lisinopril-hydrochlorothiazide (PRINZIDE,ZESTORETIC) 20-25 MG tablet Take 2 tablets by mouth daily.  . sitaGLIPtin-metformin (JANUMET) 50-1000 MG tablet Take 1 tablet by mouth 2 (two) times daily with a meal.  . tamsulosin (FLOMAX) 0.4 MG CAPS capsule Take 1 capsule (0.4 mg total) by mouth daily.     Past Surgical History:  Procedure Laterality Date  . RADIOACTIVE SEED IMPLANT N/A 10/16/2012   Procedure: RADIOACTIVE SEED IMPLANT;  Surgeon: Bernestine Amass, MD;  Location: [facility];  Service: Urology;  Laterality: N/A;  19 seeds implanted no seeds found in bladder     Family History  Problem Relation Age of Onset  . Stroke Mother   . Cancer Father        prostate    New complaints: None today  Social history: Is not retiring until the first of  the year  Controlled substance contract: n/a    Review of Systems  Constitutional: Negative for diaphoresis and weight loss.  Eyes: Negative for blurred vision, double vision and pain.  Respiratory: Negative for shortness of breath.   Cardiovascular: Negative for chest pain, palpitations, orthopnea and leg swelling.  Gastrointestinal: Negative for abdominal pain.  Skin: Negative for rash.  Neurological: Negative for dizziness, sensory change, loss of consciousness, weakness and headaches.   Endo/Heme/Allergies: Negative for polydipsia. Does not bruise/bleed easily.  Psychiatric/Behavioral: Negative for memory loss. The patient does not have insomnia.   All other systems reviewed and are negative.    Observations/Objective: Alert and oriented- answers all questions appropriately No distress    Assessment and Plan: Brandon Elliott comes in today with chief complaint of Medical Management of Chronic Issues   Diagnosis and orders addressed:  1. Essential hypertension Low sodium diet - lisinopril-hydrochlorothiazide (ZESTORETIC) 20-25 MG tablet; Take 2 tablets by mouth daily.  Dispense: 180 tablet; Refill: 1 - amLODipine (NORVASC) 5 MG tablet; TAKE 1 TABLET BY MOUTH ONCE DAILY NEEDS  OFFICE  VISIT  FOR  FURTHER  REFILLS  Dispense: 90 tablet; Refill: 1 - CMP14+EGFR; Future  2. Hyperlipidemia, unspecified hyperlipidemia type Low fat diet - fenofibrate (TRICOR) 145 MG tablet; TAKE 1 TABLET BY MOUTH ONCE DAILY NEEDS  OFFICE  VISIT  BEFORE  NEXT  REFILL  Dispense: 90 tablet; Refill: 1 - atorvastatin (LIPITOR) 40 MG tablet; TAKE 1 TABLET BY MOUTH ONCE DAILY IN THE MORNING  Dispense: 90 tablet; Refill: 1 - Lipid panel; Future  3. Type 2 diabetes mellitus without complication, with long-term current use of insulin (HCC) Continue to watch crabs in diet - [employer] [place] Hb A1c Waived; Future - sitaGLIPtin-metformin (JANUMET) 50-1000 MG tablet; Take 1 tablet by mouth 2 (two) times daily with a meal.  Dispense: 180 tablet; Refill: 1   4. BMI 32.0-32.9,adult Discussed diet and exercise for person with BMI >25 Will recheck weight in 3-6 months   6. Prostate cancer [facility]) Keep follow up appointment with urology on Thursday this week. - tamsulosin (FLOMAX) 0.4 MG CAPS capsule; Take 1 capsule (0.4 mg total) by mouth daily.  Dispense: 90 capsule; Refill: 1   Patient will call in and have labs drawn Health Maintenance reviewed Diet and exercise encouraged  Follow up plan: 3 months      I discussed the assessment and treatment plan with the patient. The patient was provided an opportunity to ask questions and all were answered. The patient agreed with the plan and demonstrated an understanding of the instructions.   The patient was advised to call back or seek an in-person evaluation if the symptoms worsen or if the condition fails to improve as anticipated.  The above assessment and management plan was discussed with the patient. The patient verbalized understanding of and has agreed to the management plan. Patient is aware to call the clinic if symptoms persist or worsen. Patient is aware when to return to the clinic for a follow-up visit. Patient educated on when it is appropriate to go to the emergency department.   Time call ended:  4:25  I provided 15 minutes of non-face-to-face time during this encounter.    Mary-Margaret Hassell Done, FNP

## 2019-04-05 ENCOUNTER — Other Ambulatory Visit: Payer: Managed Care, Other (non HMO)

## 2019-04-05 ENCOUNTER — Other Ambulatory Visit: Payer: Self-pay

## 2019-04-05 DIAGNOSIS — E119 Type 2 diabetes mellitus without complications: Secondary | ICD-10-CM

## 2019-04-05 DIAGNOSIS — Z794 Long term (current) use of insulin: Secondary | ICD-10-CM

## 2019-04-05 DIAGNOSIS — I10 Essential (primary) hypertension: Secondary | ICD-10-CM

## 2019-04-05 DIAGNOSIS — E785 Hyperlipidemia, unspecified: Secondary | ICD-10-CM

## 2019-04-05 LAB — BAYER DCA HB A1C WAIVED: HB A1C (BAYER DCA - WAIVED): 6.6 % (ref <7.0)

## 2019-04-06 LAB — LIPID PANEL
Chol/HDL Ratio: 2.6 ratio (ref 0.0–5.0)
Cholesterol, Total: 102 mg/dL (ref 100–199)
HDL: 39 mg/dL — ABNORMAL LOW (ref >39)
LDL Chol Calc (NIH): 48 mg/dL (ref 0–99)
Triglycerides: 70 mg/dL (ref 0–149)
VLDL Cholesterol Cal: 15 mg/dL (ref 5–40)

## 2019-04-06 LAB — CMP14+EGFR
ALT: 25 IU/L (ref 0–44)
AST: 21 IU/L (ref 0–40)
Albumin/Globulin Ratio: 2 (ref 1.2–2.2)
Albumin: 4.7 g/dL (ref 3.8–4.8)
Alkaline Phosphatase: 54 IU/L (ref 39–117)
BUN/Creatinine Ratio: 15 (ref 10–24)
BUN: 16 mg/dL (ref 8–27)
Bilirubin Total: 0.4 mg/dL (ref 0.0–1.2)
CO2: 26 mmol/L (ref 20–29)
Calcium: 9.9 mg/dL (ref 8.6–10.2)
Chloride: 102 mmol/L (ref 96–106)
Creatinine, Ser: 1.1 mg/dL (ref 0.76–1.27)
GFR calc Af Amer: 81 mL/min/{1.73_m2} (ref >59)
GFR calc non Af Amer: 70 mL/min/{1.73_m2} (ref >59)
Globulin, Total: 2.4 g/dL (ref 1.5–4.5)
Glucose: 141 mg/dL — ABNORMAL HIGH (ref 65–99)
Potassium: 4.4 mmol/L (ref 3.5–5.2)
Sodium: 141 mmol/L (ref 134–144)
Total Protein: 7.1 g/dL (ref 6.0–8.5)

## 2019-05-10 ENCOUNTER — Telehealth: Payer: Self-pay | Admitting: Nurse Practitioner

## 2019-05-10 NOTE — Telephone Encounter (Signed)
Patient aware and verbalized understanding. °

## 2019-05-24 ENCOUNTER — Telehealth: Payer: Self-pay | Admitting: Nurse Practitioner

## 2019-05-25 ENCOUNTER — Ambulatory Visit (INDEPENDENT_AMBULATORY_CARE_PROVIDER_SITE_OTHER): Payer: Managed Care, Other (non HMO) | Admitting: *Deleted

## 2019-05-25 ENCOUNTER — Other Ambulatory Visit: Payer: Self-pay

## 2019-05-25 DIAGNOSIS — Z23 Encounter for immunization: Secondary | ICD-10-CM

## 2019-07-03 ENCOUNTER — Other Ambulatory Visit: Payer: Self-pay

## 2019-07-05 ENCOUNTER — Ambulatory Visit: Payer: Managed Care, Other (non HMO) | Admitting: Nurse Practitioner

## 2019-07-05 ENCOUNTER — Other Ambulatory Visit: Payer: Self-pay

## 2019-07-05 ENCOUNTER — Encounter: Payer: Self-pay | Admitting: Nurse Practitioner

## 2019-07-05 VITALS — BP 148/83 | HR 81 | Temp 98.9°F | Resp 20 | Ht 65.0 in | Wt 193.0 lb

## 2019-07-05 DIAGNOSIS — E785 Hyperlipidemia, unspecified: Secondary | ICD-10-CM

## 2019-07-05 DIAGNOSIS — E119 Type 2 diabetes mellitus without complications: Secondary | ICD-10-CM

## 2019-07-05 DIAGNOSIS — C61 Malignant neoplasm of prostate: Secondary | ICD-10-CM | POA: Diagnosis not present

## 2019-07-05 DIAGNOSIS — I10 Essential (primary) hypertension: Secondary | ICD-10-CM

## 2019-07-05 DIAGNOSIS — Z794 Long term (current) use of insulin: Secondary | ICD-10-CM

## 2019-07-05 DIAGNOSIS — Z6832 Body mass index (BMI) 32.0-32.9, adult: Secondary | ICD-10-CM

## 2019-07-05 LAB — BAYER DCA HB A1C WAIVED: HB A1C (BAYER DCA - WAIVED): 7.3 % — ABNORMAL HIGH (ref <7.0)

## 2019-07-05 MED ORDER — FENOFIBRATE 145 MG PO TABS: ORAL_TABLET | ORAL | 1 refills | Status: DC

## 2019-07-05 MED ORDER — TAMSULOSIN HCL 0.4 MG PO CAPS: 0.4000 mg | ORAL_CAPSULE | Freq: Every day | ORAL | 1 refills | Status: DC

## 2019-07-05 MED ORDER — AMLODIPINE BESYLATE 5 MG PO TABS: ORAL_TABLET | ORAL | 1 refills | Status: DC

## 2019-07-05 MED ORDER — LISINOPRIL-HYDROCHLOROTHIAZIDE 20-25 MG PO TABS: 2.0000 | ORAL_TABLET | Freq: Every day | ORAL | 1 refills | Status: DC

## 2019-07-05 MED ORDER — ATORVASTATIN CALCIUM 40 MG PO TABS: ORAL_TABLET | ORAL | 1 refills | Status: DC

## 2019-07-05 MED ORDER — JANUMET 50-1000 MG PO TABS: 1.0000 | ORAL_TABLET | Freq: Two times a day (BID) | ORAL | 1 refills | Status: DC

## 2019-07-05 NOTE — Patient Instructions (Signed)
Carbohydrate Counting for Diabetes Mellitus, Adult  Carbohydrate counting is a method of keeping track of how many carbohydrates you eat. Eating carbohydrates naturally increases the amount of sugar (glucose) in the blood. Counting how many carbohydrates you eat helps keep your blood glucose within normal limits, which helps you manage your diabetes (diabetes mellitus). It is important to know how many carbohydrates you can safely have in each meal. This is different for every person. A diet and nutrition specialist (registered dietitian) can help you make a meal plan and calculate how many carbohydrates you should have at each meal and snack. Carbohydrates are found in the following foods:  Grains, such as breads and cereals.  Dried beans and soy products.  Starchy vegetables, such as potatoes, peas, and corn.  Fruit and fruit juices.  Milk and yogurt.  Sweets and snack foods, such as cake, cookies, candy, chips, and soft drinks. How do I count carbohydrates? There are two ways to count carbohydrates in food. You can use either of the methods or a combination of both. Reading "Nutrition Facts" on packaged food The "Nutrition Facts" list is included on the labels of almost all packaged foods and beverages in the [place] It includes:  The serving size.  Information about nutrients in each serving, including the grams (g) of carbohydrate per serving. To use the "Nutrition Facts":  Decide how many servings you will have.  Multiply the number of servings by the number of carbohydrates per serving.  The resulting number is the total amount of carbohydrates that you will be having. Learning standard serving sizes of other foods When you eat carbohydrate foods that are not packaged or do not include "Nutrition Facts" on the label, you need to measure the servings in order to count the amount of carbohydrates:  Measure the foods that you will eat with a food scale or measuring cup, if needed.   Decide how many standard-size servings you will eat.  Multiply the number of servings by 15. Most carbohydrate-rich foods have about 15 g of carbohydrates per serving. ? For example, if you eat 8 oz (170 g) of strawberries, you will have eaten 2 servings and 30 g of carbohydrates (2 servings x 15 g = 30 g).  For foods that have more than one food mixed, such as soups and casseroles, you must count the carbohydrates in each food that is included. The following list contains standard serving sizes of common carbohydrate-rich foods. Each of these servings has about 15 g of carbohydrates:   hamburger bun or  English muffin.   oz (15 mL) syrup.   oz (14 g) jelly.  1 slice of bread.  1 six-inch tortilla.  3 oz (85 g) cooked rice or pasta.  4 oz (113 g) cooked dried beans.  4 oz (113 g) starchy vegetable, such as peas, corn, or potatoes.  4 oz (113 g) hot cereal.  4 oz (113 g) mashed potatoes or  of a large baked potato.  4 oz (113 g) canned or frozen fruit.  4 oz (120 mL) fruit juice.  4-6 crackers.  6 chicken nuggets.  6 oz (170 g) unsweetened dry cereal.  6 oz (170 g) plain fat-free yogurt or yogurt sweetened with artificial sweeteners.  8 oz (240 mL) milk.  8 oz (170 g) fresh fruit or one small piece of fruit.  24 oz (680 g) popped popcorn. Example of carbohydrate counting Sample meal  3 oz (85 g) chicken breast.  6 oz (170 g)   brown rice.  4 oz (113 g) corn.  8 oz (240 mL) milk.  8 oz (170 g) strawberries with sugar-free whipped topping. Carbohydrate calculation 1. Identify the foods that contain carbohydrates: ? Rice. ? Corn. ? Milk. ? Strawberries. 2. Calculate how many servings you have of each food: ? 2 servings rice. ? 1 serving corn. ? 1 serving milk. ? 1 serving strawberries. 3. Multiply each number of servings by 15 g: ? 2 servings rice x 15 g = 30 g. ? 1 serving corn x 15 g = 15 g. ? 1 serving milk x 15 g = 15 g. ? 1 serving  strawberries x 15 g = 15 g. 4. Add together all of the amounts to find the total grams of carbohydrates eaten: ? 30 g + 15 g + 15 g + 15 g = 75 g of carbohydrates total. Summary  Carbohydrate counting is a method of keeping track of how many carbohydrates you eat.  Eating carbohydrates naturally increases the amount of sugar (glucose) in the blood.  Counting how many carbohydrates you eat helps keep your blood glucose within normal limits, which helps you manage your diabetes.  A diet and nutrition specialist (registered dietitian) can help you make a meal plan and calculate how many carbohydrates you should have at each meal and snack. This information is not intended to replace advice given to you by your health care provider. Make sure you discuss any questions you have with your health care provider. Document Released: 07/05/2005 Document Revised: 01/27/2017 Document Reviewed: 12/17/2015 Elsevier Patient Education  2020 Elsevier Inc.  

## 2019-07-05 NOTE — Progress Notes (Signed)
Subjective:    Patient ID: Brandon Elliott, male    DOB: 1955-10-22, 63 y.o.   MRN: [id number]   Chief Complaint: medical management of chronic issues   HPI:  1. Essential hypertension No c/o chest opain, sob or headache. dpes not check blood pressure at home. BP Readings from Last 3 Encounters:  12/19/18 136/81  09/29/18 136/82  04/13/18 127/66     2. Hyperlipidemia, unspecified hyperlipidemia type Does not really watch diet. Stays very active Lab Results  Component Value Date   CHOL 102 04/05/2019   HDL 39 (L) 04/05/2019   LDLCALC 48 04/05/2019   TRIG 70 04/05/2019   CHOLHDL 2.6 04/05/2019     3. Type 2 diabetes mellitus without complication, with long-term current use of insulin (HCC) Fasting blood sugars are checked on a regulr bass, he ran out of his meds at the end of last week. Lab Results  Component Value Date   HGBA1C 6.6 04/05/2019     4. Prostate cancer [facility]) Denies any problems voiding. Has not seen urology in awhile.  5. BMI 32.0-32.9,adult Weight I sup 14 lbs since last visit Wt Readings from Last 3 Encounters:  07/05/19 193 lb (87.5 kg)  12/19/18 179 lb 6.4 oz (81.4 kg)  09/29/18 179 lb 12.8 oz (81.6 kg)   BMI Readings from Last 3 Encounters:  07/05/19 32.12 kg/m  12/19/18 29.85 kg/m  09/29/18 29.92 kg/m       Outpatient Encounter Medications as of 07/05/2019  Medication Sig  . amLODipine (NORVASC) 5 MG tablet TAKE 1 TABLET BY MOUTH ONCE DAILY NEEDS  OFFICE  VISIT  FOR  FURTHER  REFILLS  . amoxicillin (AMOXIL) 875 MG tablet Take 1 tablet (875 mg total) by mouth 2 (two) times daily. 1 po BID  . atorvastatin (LIPITOR) 40 MG tablet TAKE 1 TABLET BY MOUTH ONCE DAILY IN THE MORNING  . fenofibrate (TRICOR) 145 MG tablet TAKE 1 TABLET BY MOUTH ONCE DAILY NEEDS  OFFICE  VISIT  BEFORE  NEXT  REFILL  . lisinopril-hydrochlorothiazide (ZESTORETIC) 20-25 MG tablet Take 2 tablets by mouth daily.  . sitaGLIPtin-metformin (JANUMET) 50-1000 MG tablet  Take 1 tablet by mouth 2 (two) times daily with a meal.  . tamsulosin (FLOMAX) 0.4 MG CAPS capsule Take 1 capsule (0.4 mg total) by mouth daily.     Past Surgical History:  Procedure Laterality Date  . RADIOACTIVE SEED IMPLANT N/A 10/16/2012   Procedure: RADIOACTIVE SEED IMPLANT;  Surgeon: Bernestine Amass, MD;  Location: [facility];  Service: Urology;  Laterality: N/A;  20 seeds implanted no seeds found in bladder     Family History  Problem Relation Age of Onset  . Stroke Mother   . Cancer Father        prostate    New complaints: None today  Social history: Is retiring at end of year  Controlled substance contract: n/a    Review of Systems  Constitutional: Negative for diaphoresis.  Eyes: Negative for pain.  Respiratory: Negative for shortness of breath.   Cardiovascular: Negative for chest pain, palpitations and leg swelling.  Gastrointestinal: Negative for abdominal pain.  Endocrine: Negative for polydipsia.  Skin: Negative for rash.  Neurological: Negative for dizziness, weakness and headaches.  Hematological: Does not bruise/bleed easily.  All other systems reviewed and are negative.      Objective:   Physical Exam Vitals and nursing note reviewed.  Constitutional:      Appearance: Normal appearance. He is well-developed.  HENT:     Head: Normocephalic.     Nose: Nose normal.  Eyes:     Pupils: Pupils are equal, round, and reactive to light.  Neck:     Thyroid: No thyroid mass or thyromegaly.     Vascular: No carotid bruit or JVD.     Trachea: Phonation normal.  Cardiovascular:     Rate and Rhythm: Normal rate and regular rhythm.  Pulmonary:     Effort: Pulmonary effort is normal. No respiratory distress.     Breath sounds: Normal breath sounds.  Abdominal:     General: Bowel sounds are normal.     Palpations: Abdomen is soft.     Tenderness: There is no abdominal tenderness.  Musculoskeletal:        General: Normal range of  motion.     Cervical back: Normal range of motion and neck supple.  Lymphadenopathy:     Cervical: No cervical adenopathy.  Skin:    General: Skin is warm and dry.  Neurological:     Mental Status: He is alert and oriented to person, place, and time.  Psychiatric:        Behavior: Behavior normal.        Thought Content: Thought content normal.        Judgment: Judgment normal.    BP (!) 148/83   Pulse 81   Temp 98.9 F (37.2 C) (Temporal)   Resp 20   Ht 5' 5"  (1.651 m)   Wt 193 lb (87.5 kg)   SpO2 98%   BMI 32.12 kg/m   hgba1c 7.3%     Assessment & Plan:  Brandon Elliott comes in today with chief complaint of Medical Management of Chronic Issues   Diagnosis and orders addressed:  1. Essential hypertension Low sodium diet - CMP14+EGFR - lisinopril-hydrochlorothiazide (ZESTORETIC) 20-25 MG tablet; Take 2 tablets by mouth daily.  Dispense: 180 tablet; Refill: 1 - amLODipine (NORVASC) 5 MG tablet; TAKE 1 TABLET BY MOUTH ONCE DAILY NEEDS  OFFICE  VISIT  FOR  FURTHER  REFILLS  Dispense: 90 tablet; Refill: 1  2. Hyperlipidemia, unspecified hyperlipidemia type Low fat diet - Lipid panel - fenofibrate (TRICOR) 145 MG tablet; TAKE 1 TABLET BY MOUTH ONCE DAILY NEEDS  OFFICE  VISIT  BEFORE  NEXT  REFILL  Dispense: 90 tablet; Refill: 1 - atorvastatin (LIPITOR) 40 MG tablet; TAKE 1 TABLET BY MOUTH ONCE DAILY IN THE MORNING  Dispense: 90 tablet; Refill: 1  3. Type 2 diabetes mellitus without complication, without long-term current use of insulin (HCC)  - sitaGLIPtin-metformin (JANUMET) 50-1000 MG tablet; Take 1 tablet by mouth 2 (two) times daily with a meal.  Dispense: 180 tablet; Refill: 1  Strict carb counting - hgba1c  4. Prostate cancer ([place])  - tamsulosin (FLOMAX) 0.4 MG CAPS capsule; Take 1 capsule (0.4 mg total) by mouth daily.  Dispense: 90 capsule; Refill: 1  5. BMI 32.0-32.9,adult Discussed diet and exercise for person with BMI >25 Will recheck weight in 3-6  months     Labs pending Health Maintenance reviewed Diet and exercise encouraged  Follow up plan: 3 months   Mary-Margaret Hassell Done, FNP

## 2019-07-06 LAB — CMP14+EGFR
ALT: 27 IU/L (ref 0–44)
AST: 22 IU/L (ref 0–40)
Albumin/Globulin Ratio: 2 (ref 1.2–2.2)
Albumin: 4.5 g/dL (ref 3.8–4.8)
Alkaline Phosphatase: 55 IU/L (ref 39–117)
BUN/Creatinine Ratio: 15 (ref 10–24)
BUN: 15 mg/dL (ref 8–27)
Bilirubin Total: 0.3 mg/dL (ref 0.0–1.2)
CO2: 26 mmol/L (ref 20–29)
Calcium: 10.1 mg/dL (ref 8.6–10.2)
Chloride: 100 mmol/L (ref 96–106)
Creatinine, Ser: 1.03 mg/dL (ref 0.76–1.27)
GFR calc Af Amer: 88 mL/min/{1.73_m2} (ref >59)
GFR calc non Af Amer: 76 mL/min/{1.73_m2} (ref >59)
Globulin, Total: 2.3 g/dL (ref 1.5–4.5)
Glucose: 108 mg/dL — ABNORMAL HIGH (ref 65–99)
Potassium: 3.8 mmol/L (ref 3.5–5.2)
Sodium: 139 mmol/L (ref 134–144)
Total Protein: 6.8 g/dL (ref 6.0–8.5)

## 2019-07-06 LAB — LIPID PANEL
Chol/HDL Ratio: 2.8 ratio (ref 0.0–5.0)
Cholesterol, Total: 110 mg/dL (ref 100–199)
HDL: 40 mg/dL (ref >39)
LDL Chol Calc (NIH): 48 mg/dL (ref 0–99)
Triglycerides: 123 mg/dL (ref 0–149)
VLDL Cholesterol Cal: 22 mg/dL (ref 5–40)

## 2019-08-21 ENCOUNTER — Encounter: Payer: Self-pay | Admitting: *Deleted

## 2019-08-28 ENCOUNTER — Telehealth: Payer: Self-pay | Admitting: Nurse Practitioner

## 2019-08-28 NOTE — Telephone Encounter (Signed)
E.r. follow up scheduled

## 2019-08-31 ENCOUNTER — Other Ambulatory Visit: Payer: Self-pay

## 2019-09-03 ENCOUNTER — Other Ambulatory Visit: Payer: Self-pay

## 2019-09-03 ENCOUNTER — Ambulatory Visit: Payer: Managed Care, Other (non HMO) | Admitting: Nurse Practitioner

## 2019-09-03 ENCOUNTER — Encounter: Payer: Self-pay | Admitting: Nurse Practitioner

## 2019-09-03 VITALS — BP 138/68 | HR 74 | Temp 99.0°F | Resp 20 | Ht 65.0 in | Wt 184.0 lb

## 2019-09-03 DIAGNOSIS — R9389 Abnormal findings on diagnostic imaging of other specified body structures: Secondary | ICD-10-CM | POA: Diagnosis not present

## 2019-09-03 NOTE — Progress Notes (Signed)
   Subjective:    Patient ID: Brandon Elliott, male    DOB: 01/14/1956, 64 y.o.   MRN: [id number]   Chief Complaint: er follow up [facility] - fall )   HPI Patient come sin today for hospital follow up. He was walking outside and slipped on some ice and hit his head. It did not knock him out but made him dizzy. He went to work and got of early and went to the ER at [facility]). They did CT scan of his head was normal. 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 They also told him they saw something on his thyroid gland tha needed to be checked out. He is feeling ok from fall , with no residual effects. He denies any problems with neck swelling, no palpable thyroid nodules and no difficulty swallowing.   Review of Systems  Constitutional: Negative.   HENT: Negative.   Respiratory: Negative.   Cardiovascular: Negative.   Musculoskeletal: Negative for back pain.  Neurological: Negative for dizziness and headaches.  All other systems reviewed and are negative.      Objective:   Physical Exam Vitals and nursing note reviewed.  Constitutional:      Appearance: Normal appearance.  Cardiovascular:     Rate and Rhythm: Normal rate and regular rhythm.     Heart sounds: Normal heart sounds.  Pulmonary:     Breath sounds: Normal breath sounds.  Musculoskeletal:        General: Normal range of motion.     Comments: Of cervical spine without pain.  Neurological:     General: No focal deficit present.     Mental Status: He is alert and oriented to person, place, and time.  Psychiatric:        Mood and Affect: Mood normal.        Behavior: Behavior normal.    BP 138/68 (BP Location: Right Arm, Cuff Size: Normal)   Pulse 74   Temp 99 F (37.2 C)   Resp 20   Ht 5\' 5"  (1.651 m)   Wt 184 lb (83.5 kg)   SpO2 99%   BMI 30.62 kg/m         Assessment & Plan:  Brandon Elliott in today with chief complaint of er follow up [facility] - fall )   1. Abnormal CT scan, neck CT report reviewed- possible left thyroid nodule. - US Soft Tissue Head/Neck; Future    The above assessment and management plan was discussed with the patient. The patient verbalized understanding of and has agreed to the management plan. Patient is aware to call the clinic if symptoms persist or worsen. Patient is aware when to return to the clinic for a follow-up visit. Patient educated on when it is appropriate to go to the emergency department.   Mary-Margaret Hassell Done, FNP

## 2019-09-03 NOTE — Patient Instructions (Signed)
Thyroid Nodule  A thyroid nodule is an isolated growth of thyroid cells that forms a lump in your thyroid gland. The thyroid gland is a butterfly-shaped gland. It is found in the lower front of your neck. This gland sends chemical messengers (hormones) through your blood to all parts of your body. These hormones are important in regulating your body temperature and helping your body to use energy. Thyroid nodules are common. Most are not cancerous (benign). You may have one nodule or several nodules. Different types of thyroid nodules include nodules that:  Grow and fill with fluid (thyroid cysts).  Produce too much thyroid hormone (hot nodules or hyperthyroid).  Produce no thyroid hormone (cold nodules or hypothyroid).  Form from cancer cells (thyroid cancers). What are the causes? In most cases, the cause of this condition is not known. What increases the risk? The following factors may make you more likely to develop this condition.  Age. Thyroid nodules become more common in people who are older than 66 years of age.  Gender. ? Benign thyroid nodules are more common in women. ? Cancerous (malignant) thyroid nodules are more common in men.  A family history that includes: ? Thyroid nodules. ? Pheochromocytoma. ? Thyroid carcinoma. ? Hyperparathyroidism.  Certain kinds of thyroid diseases, such as Hashimoto's thyroiditis.  Lack of iodine in your diet.  A history of head and neck radiation, such as from previous cancer treatment. What are the signs or symptoms? In many cases, there are no symptoms. If you have symptoms, they may include:  A lump in your lower neck.  Feeling a lump or tickle in your throat.  Pain in your neck, jaw, or ear.  Having trouble swallowing. Hot nodules may cause symptoms that include:  Weight loss.  Warm, flushed skin.  Feeling hot.  Feeling nervous.  A racing heartbeat. Cold nodules may cause symptoms that include:  Weight  gain.  Dry skin.  Brittle hair. This may also occur with hair loss.  Feeling cold.  Fatigue. Thyroid cancer nodules may cause symptoms that include:  Hard nodules that feel stuck to the thyroid gland.  Hoarseness.  Lumps in the glands near your thyroid (lymph nodes). How is this diagnosed? A thyroid nodule may be felt by your health care provider during a physical exam. This condition may also be diagnosed based on your symptoms. You may also have tests, including:  An ultrasound. This may be done to confirm the diagnosis.  A biopsy. This involves taking a sample from the nodule and looking at it under a microscope.  Blood tests to make sure that your thyroid is working properly.  A thyroid scan. This test uses a radioactive tracer injected into a vein to create an image of the thyroid gland on a computer screen.  Imaging tests such as MRI or CT scan. These may be done if: ? Your nodule is large. ? Your nodule is blocking your airway. ? Cancer is suspected. How is this treated? Treatment depends on the cause and size of your nodule or nodules. If the nodule is benign, treatment may not be necessary. Your health care provider may monitor the nodule to see if it goes away without treatment. If the nodule continues to grow, is cancerous, or does not go away, treatment may be needed. Treatment may include:  Having a cystic nodule drained with a needle.  Ablation therapy. In this treatment, alcohol is injected into the area of the nodule to destroy the cells. Ablation with heat (  thermal ablation) may also be used.  Radioactive iodine. In this treatment, radioactive iodine is given as a pill or liquid that you drink. This substance causes the thyroid nodule to shrink.  Surgery to remove the nodule. Part or all of your thyroid gland may need to be removed as well.  Medicines. Follow these instructions at home:  Pay attention to any changes in your nodule.  Take  over-the-counter and prescription medicines only as told by your health care provider.  Keep all follow-up visits as told by your health care provider. This is important. Contact a health care provider if:  Your voice changes.  You have trouble swallowing.  You have pain in your neck, ear, or jaw that is getting worse.  Your nodule gets bigger.  Your nodule starts to make it harder for you to breathe.  Your muscles look like they are shrinking (muscle wasting). Get help right away if:  You have chest pain.  There is a loss of consciousness.  You have a sudden fever.  You feel confused.  You are seeing or hearing things that other people do not see or hear (having hallucinations).  You feel very weak.  You have mood swings.  You feel very restless.  You feel suddenly nauseous or throw up.  You suddenly have diarrhea. Summary  A thyroid nodule is an isolated growth of thyroid cells that forms a lump in your thyroid gland.  Thyroid nodules are common. Most are not cancerous (benign). You may have one nodule or several nodules.  Treatment depends on the cause and size of your nodule or nodules. If the nodule is benign, treatment may not be necessary.  Your health care provider may monitor the nodule to see if it goes away without treatment. If the nodule continues to grow, is cancerous, or does not go away, treatment may be needed. This information is not intended to replace advice given to you by your health care provider. Make sure you discuss any questions you have with your health care provider. Document Revised: 02/17/2018 Document Reviewed: 02/20/2018 Elsevier Patient Education  2020 [employer]  

## 2019-09-06 ENCOUNTER — Other Ambulatory Visit: Payer: Self-pay

## 2019-09-06 ENCOUNTER — Ambulatory Visit (HOSPITAL_COMMUNITY)
Admission: RE | Admit: 2019-09-06 | Discharge: 2019-09-06 | Disposition: A | Discharge status: Home / Self Care | Payer: Managed Care, Other (non HMO) | Source: Ambulatory Visit | Attending: Nurse Practitioner | Admitting: Nurse Practitioner

## 2019-09-06 DIAGNOSIS — R9389 Abnormal findings on diagnostic imaging of other specified body structures: Secondary | ICD-10-CM | POA: Insufficient documentation

## 2019-09-06 DIAGNOSIS — R590 Localized enlarged lymph nodes: Secondary | ICD-10-CM | POA: Insufficient documentation

## 2019-09-13 ENCOUNTER — Telehealth: Payer: Self-pay | Admitting: Nurse Practitioner

## 2019-09-13 NOTE — Telephone Encounter (Signed)
lmtcb

## 2019-09-13 NOTE — Telephone Encounter (Signed)
Pt returned missed call from [place]. Requested to be called back either today or tomorrow.

## 2019-09-13 NOTE — Telephone Encounter (Signed)
Pt called wanting info regarding his [place] results. Requested to speak with MMM or nurse on what next step was.

## 2019-09-14 NOTE — Telephone Encounter (Signed)
Patient says he does not feel any swelling in his neck but will monitor for any changes and call if needed.

## 2019-09-14 NOTE — Telephone Encounter (Signed)
No voice mail set up.     Need to ask if lymph nodes are swollen or better, in neck?

## 2019-10-19 ENCOUNTER — Ambulatory Visit: Payer: Self-pay | Admitting: Nurse Practitioner

## 2019-10-22 ENCOUNTER — Ambulatory Visit (INDEPENDENT_AMBULATORY_CARE_PROVIDER_SITE_OTHER): Payer: Managed Care, Other (non HMO) | Admitting: Nurse Practitioner

## 2019-10-22 ENCOUNTER — Other Ambulatory Visit: Payer: Self-pay

## 2019-10-22 ENCOUNTER — Encounter: Payer: Self-pay | Admitting: Nurse Practitioner

## 2019-10-22 VITALS — BP 134/83 | HR 97 | Temp 97.3°F | Ht 65.0 in | Wt 183.6 lb

## 2019-10-22 DIAGNOSIS — M545 Low back pain, unspecified: Secondary | ICD-10-CM

## 2019-10-22 DIAGNOSIS — Z6832 Body mass index (BMI) 32.0-32.9, adult: Secondary | ICD-10-CM

## 2019-10-22 DIAGNOSIS — I10 Essential (primary) hypertension: Secondary | ICD-10-CM

## 2019-10-22 DIAGNOSIS — C61 Malignant neoplasm of prostate: Secondary | ICD-10-CM | POA: Diagnosis not present

## 2019-10-22 DIAGNOSIS — E785 Hyperlipidemia, unspecified: Secondary | ICD-10-CM

## 2019-10-22 DIAGNOSIS — E119 Type 2 diabetes mellitus without complications: Secondary | ICD-10-CM

## 2019-10-22 LAB — BAYER DCA HB A1C WAIVED: HB A1C (BAYER DCA - WAIVED): 7.1 % — ABNORMAL HIGH (ref <7.0)

## 2019-10-22 MED ORDER — LISINOPRIL-HYDROCHLOROTHIAZIDE 20-25 MG PO TABS: 2.0000 | ORAL_TABLET | Freq: Every day | ORAL | 1 refills | Status: DC

## 2019-10-22 MED ORDER — CYCLOBENZAPRINE HCL 10 MG PO TABS: 10.0000 mg | ORAL_TABLET | Freq: Three times a day (TID) | ORAL | 1 refills | Status: DC | PRN

## 2019-10-22 MED ORDER — NAPROXEN 500 MG PO TABS: 500.0000 mg | ORAL_TABLET | Freq: Two times a day (BID) | ORAL | 1 refills | Status: DC

## 2019-10-22 MED ORDER — JANUMET 50-1000 MG PO TABS: 1.0000 | ORAL_TABLET | Freq: Two times a day (BID) | ORAL | 1 refills | Status: DC

## 2019-10-22 MED ORDER — AMLODIPINE BESYLATE 5 MG PO TABS: ORAL_TABLET | ORAL | 1 refills | Status: DC

## 2019-10-22 MED ORDER — TAMSULOSIN HCL 0.4 MG PO CAPS: 0.4000 mg | ORAL_CAPSULE | Freq: Every day | ORAL | 1 refills | Status: DC

## 2019-10-22 MED ORDER — FENOFIBRATE 145 MG PO TABS: ORAL_TABLET | ORAL | 1 refills | Status: DC

## 2019-10-22 MED ORDER — ATORVASTATIN CALCIUM 40 MG PO TABS: ORAL_TABLET | ORAL | 1 refills | Status: DC

## 2019-10-22 NOTE — Progress Notes (Signed)
Subjective:    Patient ID: Brandon Elliott, male    DOB: May 21, 1956, 64 y.o.   MRN: [id number]   Chief Complaint: Medical Management of Chronic Issues (back pain from moving stuff)    HPI:  1. Essential hypertension No c/o chest pain, sob or headache. Does not check blood pressure at home. BP Readings from Last 3 Encounters:  10/22/19 134/83  09/03/19 138/68  07/05/19 (!) 148/83     2. Hyperlipidemia, unspecified hyperlipidemia type Does not watch diet and does no dedicated exercise.  Lab Results  Component Value Date   CHOL 110 07/05/2019   HDL 40 07/05/2019   LDLCALC 48 07/05/2019   TRIG 123 07/05/2019   CHOLHDL 2.8 07/05/2019     3. Type 2 diabetes mellitus without complication, with long-term current use of insulin (HCC) He has not been checking his blood sugars. Not really watching diet either. He denies any symptoms of low blood sugars. Lab Results  Component Value Date   HGBA1C 7.3 (H) 07/05/2019     4. Prostate cancer [facility]) Denies any problems voiding. Sees urology yearly. Is still on flomax  5. BMI 32.0-32.9,adult No recent eight changes Wt Readings from Last 3 Encounters:  10/22/19 183 lb 9.6 oz (83.3 kg)  09/03/19 184 lb (83.5 kg)  07/05/19 193 lb (87.5 kg)   BMI Readings from Last 3 Encounters:  10/22/19 30.55 kg/m  09/03/19 30.62 kg/m  07/05/19 32.12 kg/m       Outpatient Encounter Medications as of 10/22/2019  Medication Sig  . amLODipine (NORVASC) 5 MG tablet TAKE 1 TABLET BY MOUTH ONCE DAILY NEEDS  OFFICE  VISIT  FOR  FURTHER  REFILLS  . atorvastatin (LIPITOR) 40 MG tablet TAKE 1 TABLET BY MOUTH ONCE DAILY IN THE MORNING  . fenofibrate (TRICOR) 145 MG tablet TAKE 1 TABLET BY MOUTH ONCE DAILY NEEDS  OFFICE  VISIT  BEFORE  NEXT  REFILL  . lisinopril-hydrochlorothiazide (ZESTORETIC) 20-25 MG tablet Take 2 tablets by mouth daily.  . sitaGLIPtin-metformin (JANUMET) 50-1000 MG tablet Take 1 tablet by mouth 2 (two) times daily with a meal.  .  tamsulosin (FLOMAX) 0.4 MG CAPS capsule Take 1 capsule (0.4 mg total) by mouth daily.     Past Surgical History:  Procedure Laterality Date  . RADIOACTIVE SEED IMPLANT N/A 10/16/2012   Procedure: RADIOACTIVE SEED IMPLANT;  Surgeon: Bernestine Amass, MD;  Location: [facility];  Service: Urology;  Laterality: N/A;  47 seeds implanted no seeds found in bladder     Family History  Problem Relation Age of Onset  . Stroke Mother   . Cancer Father        prostate    New complaints: C/O low back pain. He was trying to move a refrigerator and pulled muscle in his back. Rates pain 8/10 currently. Standing up increases pain. layiig down decreeases pain. Pain does not radiate down leg  Social history: Lives with his girlfriend. Still works for [employer]: n/a    Review of Systems  Constitutional: Negative for diaphoresis.  Eyes: Negative for pain.  Respiratory: Negative for shortness of breath.   Cardiovascular: Negative for chest pain, palpitations and leg swelling.  Gastrointestinal: Negative for abdominal pain.  Endocrine: Negative for polydipsia.  Musculoskeletal: Positive for back pain.  Skin: Negative for rash.  Neurological: Negative for dizziness, weakness and headaches.  Hematological: Does not bruise/bleed easily.  All other systems reviewed and are negative.  Objective:   Physical Exam Vitals and nursing note reviewed.  Constitutional:      Appearance: Normal appearance. He is well-developed.  HENT:     Head: Normocephalic.     Nose: Nose normal.  Eyes:     Pupils: Pupils are equal, round, and reactive to light.  Neck:     Thyroid: No thyroid mass or thyromegaly.     Vascular: No carotid bruit or JVD.     Trachea: Phonation normal.  Cardiovascular:     Rate and Rhythm: Normal rate and regular rhythm.  Pulmonary:     Effort: Pulmonary effort is normal. No respiratory distress.     Breath sounds: Normal  breath sounds.  Abdominal:     General: Bowel sounds are normal.     Palpations: Abdomen is soft.     Tenderness: There is no abdominal tenderness.  Musculoskeletal:        General: Normal range of motion.     Cervical back: Normal range of motion and neck supple.     Comments: FROM of lumbar pone with pain on flexion and extension Denies pain with rotation (-) SLR bil Motor strength nad sensation distally intact  Lymphadenopathy:     Cervical: No cervical adenopathy.  Skin:    General: Skin is warm and dry.  Neurological:     Mental Status: He is alert and oriented to person, place, and time.  Psychiatric:        Behavior: Behavior normal.        Thought Content: Thought content normal.        Judgment: Judgment normal.    BP 134/83   Pulse 97   Temp (!) 97.3 F (36.3 C) (Temporal)   Ht 5' 5"  (1.651 m)   Wt 183 lb 9.6 oz (83.3 kg)   SpO2 99%   BMI 30.55 kg/m   hgba1c 7.1%      Assessment & Plan:  Brandon Elliott comes in today with chief complaint of Medical Management of Chronic Issues (back pain from moving stuff)   Diagnosis and orders addressed:  1. Essential hypertension Low sodium diet - CBC with Differential/Platelet - CMP14+EGFR  2. Hyperlipidemia, unspecified hyperlipidemia type Low fat diet - Lipid panel  3. Type 2 diabetes mellitus without complication, without long-term current use of insulin (HCC) Continue to watch carbs in diet - Bayer DCA Hb A1c Waived - Microalbumin / creatinine urine ratio  4. Prostate cancer [facility]) Keep follow up with urologyy - PSA, total and free  5. BMI 32.0-32.9,adult Discussed diet and exercise for person with BMI >25 Will recheck weight in 3-6 months  6. Acute midline low back pain without sciatica Moist heat  rest - naproxen (NAPROSYN) 500 MG tablet; Take 1 tablet (500 mg total) by mouth 2 (two) times daily with a meal.  Dispense: 60 tablet; Refill: 1 - cyclobenzaprine (FLEXERIL) 10 MG tablet; Take 1 tablet  (10 mg total) by mouth 3 (three) times daily as needed for muscle spasms.  Dispense: 30 tablet; Refill: 1    Labs pending Health Maintenance reviewed Diet and exercise encouraged  Follow up plan: 3 months   Mary-Margaret Hassell Done, FNP

## 2019-10-22 NOTE — Patient Instructions (Signed)
Acute Back Pain, Adult Acute back pain is sudden and usually short-lived. It is often caused by an injury to the muscles and tissues in the back. The injury may result from:  A muscle or ligament getting overstretched or torn (strained). Ligaments are tissues that connect bones to each other. Lifting something improperly can cause a back strain.  Wear and tear (degeneration) of the spinal disks. Spinal disks are circular tissue that provides cushioning between the bones of the spine (vertebrae).  Twisting motions, such as while playing sports or doing yard work.  A hit to the back.  Arthritis. You may have a physical exam, lab tests, and imaging tests to find the cause of your pain. Acute back pain usually goes away with rest and home care. Follow these instructions at home: Managing pain, stiffness, and swelling  Take over-the-counter and prescription medicines only as told by your health care provider.  Your health care provider may recommend applying ice during the first 24-48 hours after your pain starts. To do this: ? Put ice in a plastic bag. ? Place a towel between your skin and the bag. ? Leave the ice on for 20 minutes, 2-3 times a day.  If directed, apply heat to the affected area as often as told by your health care provider. Use the heat source that your health care provider recommends, such as a moist heat pack or a heating pad. ? Place a towel between your skin and the heat source. ? Leave the heat on for 20-30 minutes. ? Remove the heat if your skin turns bright red. This is especially important if you are unable to feel pain, heat, or cold. You have a greater risk of getting burned. Activity   Do not stay in bed. Staying in bed for more than 1-2 days can delay your recovery.  Sit up and stand up straight. Avoid leaning forward when you sit, or hunching over when you stand. ? If you work at a desk, sit close to it so you do not need to lean over. Keep your chin tucked  in. Keep your neck drawn back, and keep your elbows bent at a right angle. Your arms should look like the letter "L." ? Sit high and close to the steering wheel when you drive. Add lower back (lumbar) support to your car seat, if needed.  Take short walks on even surfaces as soon as you are able. Try to increase the length of time you walk each day.  Do not sit, drive, or stand in one place for more than 30 minutes at a time. Sitting or standing for long periods of time can put stress on your back.  Do not drive or use heavy machinery while taking prescription pain medicine.  Use proper lifting techniques. When you bend and lift, use positions that put less stress on your back: ? Bend your knees. ? Keep the load close to your body. ? Avoid twisting.  Exercise regularly as told by your health care provider. Exercising helps your back heal faster and helps prevent back injuries by keeping muscles strong and flexible.  Work with a physical therapist to make a safe exercise program, as recommended by your health care provider. Do any exercises as told by your physical therapist. Lifestyle  Maintain a healthy weight. Extra weight puts stress on your back and makes it difficult to have good posture.  Avoid activities or situations that make you feel anxious or stressed. Stress and anxiety increase muscle   tension and can make back pain worse. Learn ways to manage anxiety and stress, such as through exercise. General instructions  Sleep on a firm mattress in a comfortable position. Try lying on your side with your knees slightly bent. If you lie on your back, put a pillow under your knees.  Follow your treatment plan as told by your health care provider. This may include: ? Cognitive or behavioral therapy. ? Acupuncture or massage therapy. ? Meditation or yoga. Contact a health care provider if:  You have pain that is not relieved with rest or medicine.  You have increasing pain going down  into your legs or buttocks.  Your pain does not improve after 2 weeks.  You have pain at night.  You lose weight without trying.  You have a fever or chills. Get help right away if:  You develop new bowel or bladder control problems.  You have unusual weakness or numbness in your arms or legs.  You develop nausea or vomiting.  You develop abdominal pain.  You feel faint. Summary  Acute back pain is sudden and usually short-lived.  Use proper lifting techniques. When you bend and lift, use positions that put less stress on your back.  Take over-the-counter and prescription medicines and apply heat or ice as directed by your health care provider. This information is not intended to replace advice given to you by your health care provider. Make sure you discuss any questions you have with your health care provider. Document Revised: 10/24/2018 Document Reviewed: 02/16/2017 Elsevier Patient Education  2020 Elsevier Inc.  

## 2019-10-23 LAB — CBC WITH DIFFERENTIAL/PLATELET
Basophils Absolute: 0 10*3/uL (ref 0.0–0.2)
Basos: 1 %
EOS (ABSOLUTE): 0 10*3/uL (ref 0.0–0.4)
Eos: 1 %
Hematocrit: 42.6 % (ref 37.5–51.0)
Hemoglobin: 15 g/dL (ref 13.0–17.7)
Immature Grans (Abs): 0 10*3/uL (ref 0.0–0.1)
Immature Granulocytes: 0 %
Lymphocytes Absolute: 3.1 10*3/uL (ref 0.7–3.1)
Lymphs: 50 %
MCH: 33.1 pg — ABNORMAL HIGH (ref 26.6–33.0)
MCHC: 35.2 g/dL (ref 31.5–35.7)
MCV: 94 fL (ref 79–97)
Monocytes Absolute: 0.5 10*3/uL (ref 0.1–0.9)
Monocytes: 9 %
Neutrophils Absolute: 2.4 10*3/uL (ref 1.4–7.0)
Neutrophils: 39 %
Platelets: 187 10*3/uL (ref 150–450)
RBC: 4.53 x10E6/uL (ref 4.14–5.80)
RDW: 13.6 % (ref 11.6–15.4)
WBC: 6.1 10*3/uL (ref 3.4–10.8)

## 2019-10-23 LAB — CMP14+EGFR
ALT: 20 IU/L (ref 0–44)
AST: 21 IU/L (ref 0–40)
Albumin/Globulin Ratio: 2.3 — ABNORMAL HIGH (ref 1.2–2.2)
Albumin: 4.6 g/dL (ref 3.8–4.8)
Alkaline Phosphatase: 57 IU/L (ref 39–117)
BUN/Creatinine Ratio: 12 (ref 10–24)
BUN: 14 mg/dL (ref 8–27)
Bilirubin Total: 0.5 mg/dL (ref 0.0–1.2)
CO2: 26 mmol/L (ref 20–29)
Calcium: 9.7 mg/dL (ref 8.6–10.2)
Chloride: 102 mmol/L (ref 96–106)
Creatinine, Ser: 1.13 mg/dL (ref 0.76–1.27)
GFR calc Af Amer: 78 mL/min/{1.73_m2} (ref >59)
GFR calc non Af Amer: 68 mL/min/{1.73_m2} (ref >59)
Globulin, Total: 2 g/dL (ref 1.5–4.5)
Glucose: 190 mg/dL — ABNORMAL HIGH (ref 65–99)
Potassium: 3.9 mmol/L (ref 3.5–5.2)
Sodium: 142 mmol/L (ref 134–144)
Total Protein: 6.6 g/dL (ref 6.0–8.5)

## 2019-10-23 LAB — PSA, TOTAL AND FREE
PSA, Free: <0.02 ng/mL
Prostate Specific Ag, Serum: <0.1 ng/mL (ref 0.0–4.0)

## 2019-10-23 LAB — LIPID PANEL
Chol/HDL Ratio: 2.7 ratio (ref 0.0–5.0)
Cholesterol, Total: 112 mg/dL (ref 100–199)
HDL: 41 mg/dL (ref >39)
LDL Chol Calc (NIH): 35 mg/dL (ref 0–99)
Triglycerides: 230 mg/dL — ABNORMAL HIGH (ref 0–149)
VLDL Cholesterol Cal: 36 mg/dL (ref 5–40)

## 2019-10-23 LAB — MICROALBUMIN / CREATININE URINE RATIO
Creatinine, Urine: 186.5 mg/dL
Microalb/Creat Ratio: 26 mg/g creat (ref 0–29)
Microalbumin, Urine: 48.1 ug/mL

## 2019-11-05 ENCOUNTER — Encounter: Payer: Managed Care, Other (non HMO) | Admitting: Nurse Practitioner

## 2019-11-05 NOTE — Progress Notes (Deleted)
   Virtual Visit via telephone Note Due to COVID-19 pandemic this visit was conducted virtually. This visit type was conducted due to national recommendations for restrictions regarding the COVID-19 Pandemic (e.g. social distancing, sheltering in place) in an effort to limit this patient's exposure and mitigate transmission in our community. All issues noted in this document were discussed and addressed.  A physical exam was not performed with this format.  I connected with Brandon Elliott on 11/05/19 at *** by telephone and verified that I am speaking with the correct person using two identifiers. Brandon Elliott is currently located at *** and *** is currently with *** during visit. The provider, Mary-Margaret Hassell Done, FNP is located in their office at time of visit.  I discussed the limitations, risks, security and privacy concerns of performing an evaluation and management service by telephone and the availability of in person appointments. I also discussed with the patient that there may be a patient responsible charge related to this service. The patient expressed understanding and agreed to proceed.   History and Present Illness:  HPI    ROS   Observations/Objective: ***  Assessment and Plan: ***  Follow Up Instructions: ***    I discussed the assessment and treatment plan with the patient. The patient was provided an opportunity to ask questions and all were answered. The patient agreed with the plan and demonstrated an understanding of the instructions.   The patient was advised to call back or seek an in-person evaluation if the symptoms worsen or if the condition fails to improve as anticipated.  The above assessment and management plan was discussed with the patient. The patient verbalized understanding of and has agreed to the management plan. Patient is aware to call the clinic if symptoms persist or worsen. Patient is aware when to return to the clinic for a follow-up visit.  Patient educated on when it is appropriate to go to the emergency department.   Time call ended:    I provided *** minutes of non-face-to-face time during this encounter.    Mary-Margaret Hassell Done, FNP

## 2019-11-06 ENCOUNTER — Other Ambulatory Visit: Payer: Self-pay | Admitting: Nurse Practitioner

## 2019-11-06 ENCOUNTER — Telehealth: Payer: Self-pay | Admitting: Nurse Practitioner

## 2019-11-06 DIAGNOSIS — M545 Low back pain, unspecified: Secondary | ICD-10-CM

## 2019-11-06 NOTE — Telephone Encounter (Signed)
ER follow up telephone visit scheduled with Brandon Elliott on 11/08/2019, patient aware.

## 2019-11-08 ENCOUNTER — Encounter: Payer: Self-pay | Admitting: Nurse Practitioner

## 2019-11-08 ENCOUNTER — Ambulatory Visit (INDEPENDENT_AMBULATORY_CARE_PROVIDER_SITE_OTHER): Payer: Managed Care, Other (non HMO) | Admitting: Nurse Practitioner

## 2019-11-08 DIAGNOSIS — M5441 Lumbago with sciatica, right side: Secondary | ICD-10-CM | POA: Diagnosis not present

## 2019-11-08 MED ORDER — PREDNISONE 10 MG (21) PO TBPK: ORAL_TABLET | ORAL | 0 refills | Status: DC

## 2019-11-08 NOTE — Progress Notes (Signed)
Virtual Visit via telephone Note Due to COVID-19 pandemic this visit was conducted virtually. This visit type was conducted due to national recommendations for restrictions regarding the COVID-19 Pandemic (e.g. social distancing, sheltering in place) in an effort to limit this patient's exposure and mitigate transmission in our community. All issues noted in this document were discussed and addressed.  A physical exam was not performed with this format.  I connected with Brandon Elliott on 11/08/19 at 11:55 by telephone and verified that I am speaking with the correct person using two identifiers. Brandon Elliott is currently located at home and no one is currently with him during visit. The provider, Mary-Margaret Hassell Done, FNP is located in their office at time of visit.  I discussed the limitations, risks, security and privacy concerns of performing an evaluation and management service by telephone and the availability of in person appointments. I also discussed with the patient that there may be a patient responsible charge related to this service. The patient expressed understanding and agreed to proceed.   History and Present Illness:   Chief Complaint: Back Pain   HPI Patient was moving a refrigerator 2 weeks ago  And injured his back. He has been on naprosyn and flexeril. He said the pain has continued to worsen. We did referral to PT last week, but he has not heard from them to schedule. He had to go to the ER on Monday because pain was worsening. They did xrays and gave him hydrocodone. They told him he needed MRI. Rates pain 7/10 currently and any movement increases pain to a 10/10.    Review of Systems  Constitutional: Negative for diaphoresis and weight loss.  Eyes: Negative for blurred vision, double vision and pain.  Respiratory: Negative for shortness of breath.   Cardiovascular: Negative for chest pain, palpitations, orthopnea and leg swelling.  Gastrointestinal: Negative for  abdominal pain.  Musculoskeletal: Positive for back pain (radiating down right leg).  Skin: Negative for rash.  Neurological: Negative for dizziness, sensory change, loss of consciousness, weakness and headaches.  Endo/Heme/Allergies: Negative for polydipsia. Does not bruise/bleed easily.  Psychiatric/Behavioral: Negative for memory loss. The patient does not have insomnia.   All other systems reviewed and are negative.    Observations/Objective: Alert and oriented- answers all questions appropriately No distress    Assessment and Plan: [name] in today with chief complaint of Back Pain   1. Acute right-sided low back pain with right-sided sciatica Moist heat Rest Ice BID Watch blood sugars while on steroids- they will increase blood sugar readings Meds ordered this encounter  Medications  . predniSONE (STERAPRED UNI-PAK 21 TAB) 10 MG (21) TBPK tablet    Sig: As directed x 6 days    Dispense:  21 tablet    Refill:  0    Order Specific Question:   Supervising Provider    Answer:   Caryl Pina A [id number]   I will fincd out what is going on with physical therapy referral       Follow Up Instructions: prn    I discussed the assessment and treatment plan with the patient. The patient was provided an opportunity to ask questions and all were answered. The patient agreed with the plan and demonstrated an understanding of the instructions.   The patient was advised to call back or seek an in-person evaluation if the symptoms worsen or if the condition fails to improve as anticipated.  The above assessment and management plan was  discussed with the patient. The patient verbalized understanding of and has agreed to the management plan. Patient is aware to call the clinic if symptoms persist or worsen. Patient is aware when to return to the clinic for a follow-up visit. Patient educated on when it is appropriate to go to the emergency department.   Time call  ended:  12:10  I provided 15  minutes of non-face-to-face time during this encounter.    Mary-Margaret Hassell Done, FNP

## 2019-11-12 ENCOUNTER — Ambulatory Visit: Payer: Managed Care, Other (non HMO) | Attending: Nurse Practitioner | Admitting: Physical Therapy

## 2019-11-12 ENCOUNTER — Telehealth: Payer: Self-pay | Admitting: Nurse Practitioner

## 2019-11-12 ENCOUNTER — Other Ambulatory Visit: Payer: Self-pay

## 2019-11-12 DIAGNOSIS — M5441 Lumbago with sciatica, right side: Secondary | ICD-10-CM | POA: Diagnosis present

## 2019-11-12 NOTE — Telephone Encounter (Signed)
Faxed letter to employer at [phone]

## 2019-11-12 NOTE — Telephone Encounter (Signed)
Ok ot  of work note for 2 weeks

## 2019-11-12 NOTE — Telephone Encounter (Addendum)
Patient walked into office after PT appointment. Stated that his lower back is still hurting and the pain radiates down his right leg. Pain level is an 8. Medication that Shelah Lewandowsky prescribed is helpful. Patient is going to PT twice weekly. He is supposed to return to work this Wednesday. Patient does not think he is ready to go back. Asked if he thought he could return back to work next Monday and he said he doesn't know. He would like a work note to keep him out of work.

## 2019-11-12 NOTE — Therapy (Signed)
[facility] [place], [place], [address] Phone: [phone]   Fax:  [phone]  Physical Therapy Evaluation  Patient Details  Name: Brandon Elliott MRN: [id number] Date of Birth: 1956/02/28 Referring Provider (PT): Ronnald Collum   Encounter Date: 11/12/2019  PT End of Session - 11/12/19 1056    Visit Number  1    Number of Visits  12    Date for PT Re-Evaluation  11/19/19    Authorization Type  FOTO.    PT Start Time  0900    PT Stop Time  0946    PT Time Calculation (min)  46 min    Activity Tolerance  Patient tolerated treatment well    Behavior During Therapy  WFL for tasks assessed/performed       Past Medical History:  Diagnosis Date  . Bronchitis    DX 10-03-2012--  ON ANTIBIOTICS  . DM (diabetes mellitus), type 2 ([place])   . History of BPH    W/ BOO  . History of gastric ulcer 2010  . Hyperlipidemia   . Hypertension   . Prostate cancer ([place]) 06/29/12   Adenocarcinoma,gleason=3+4=7,& 3+3=6,PSA=4.23,volume=38cc    Past Surgical History:  Procedure Laterality Date  . RADIOACTIVE SEED IMPLANT N/A 10/16/2012   Procedure: RADIOACTIVE SEED IMPLANT;  Surgeon: Bernestine Amass, MD;  Location: [facility];  Service: Urology;  Laterality: N/A;  69 seeds implanted no seeds found in bladder     There were no vitals filed for this visit.   Subjective Assessment - 11/12/19 1052    Subjective  COVID-19 screen performed prior to patient entering clinic.  The patient states he was moving furniture a couple weeks ago and afterwards began to experience right-sided back pain and pain down his lateral/posterior thigh to the level of his knee.  His pain is rated at an 8/10 today.  Standing greater than 10 minutes increases his pain.  Lying down decreases his pain.  He feel some numbness in his right LE as well.    Pertinent History  DM, HTN, prostate CA.    How long can you stand comfortably?  ~10 minutes.    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    Currently in Pain?  Yes    Pain Score  8     Pain Location  Back    Pain Orientation  Right    Pain Descriptors / Indicators  Aching;Numbness    Pain Type  Acute pain    Pain Onset  1 to 4 weeks ago    Pain Frequency  Constant    Aggravating Factors   See above.    Pain Relieving Factors  See above.         [facility] PT Assessment - 11/12/19 0001      Assessment   Medical Diagnosis  Acute midline low back pain without sciatica.    Referring Provider (PT)  Ronnald Collum    Onset Date/Surgical Date  --   ~2 weeks.     Precautions   Precautions  None      Restrictions   Weight Bearing Restrictions  No      Balance Screen   Has the patient fallen in the past 6 months  Yes    How many times?  --   1.   Has the patient had a decrease in activity level because of a fear of falling?  Yes    Is the patient reluctant to leave their home because of a fear of falling?   Yes      [place] residence      Prior Function   Level of Independence  Independent    Vocation Requirements  --   Driving large truck.     Observation/Other Assessments   Focus on Therapeutic Outcomes (FOTO)   64% limitation.      Posture/Postural Control   Posture/Postural Control  Postural limitations    Postural Limitations  Flexed trunk      Deep Tendon Reflexes   DTR Assessment Site  Patella;Achilles    Patella DTR  0    Achilles DTR  0      ROM / Strength   AROM / PROM / Strength  AROM;Strength      AROM   Overall AROM Comments  Active lumbar extension= 20 degrees and flexion is limited by 50%.      Strength   Overall Strength Comments  Normal LE strength.      Palpation   Palpation comment  Tender to palpation over right SIJ and right upper gluteal muscualture.      Special Tests   Other special tests  (=) leg lengths.  (-) SLR testing.  (+) right FABER testing.       Ambulation/Gait   Gait Comments  Slow and cautious in some trunk flexion in obvious pain.                Objective measurements completed on examination: See above findings.      [facility] Adult PT Treatment/Exercise - 11/12/19 0001      Modalities   Modalities  Electrical Stimulation;Moist Heat      Moist Heat Therapy   Number Minutes Moist Heat  15 Minutes    Moist Heat Location  Lumbar Spine      Electrical Stimulation   Electrical Stimulation Location  Right low back.    Electrical Stimulation Action  Pre-mod.    Electrical Stimulation Parameters  80-150 Hz x 15 minutes.    Electrical Stimulation Goals  Pain                  PT Long Term Goals - 11/12/19 1232      PT LONG TERM GOAL #1   Title  Independent with a HEP.    Time  6    Period  Weeks    Status  New      PT LONG TERM GOAL #2   Title  Stand 20 minutes with pain not > 3/10.    Time  6    Period  Weeks    Status  New      PT LONG TERM GOAL #3   Title  Perform ADL's with pain not > 3/10.    Time  6    Period  Weeks    Status  New      PT LONG TERM GOAL #4   Title  Eliminate right LE symptoms.    Time  6    Period  Weeks    Status  New             Plan - 11/12/19 1200    Clinical Impression Statement  The patient presents to OPPT with c/o right low back pain with radition of pain and numbness into his posterior and thigh thigh.  He attributes this to moving furniture a  couple weeks ago.  He has limited lumbar flexion.  He is tender to palpation over his right SIJ and upper gluteal region.  He has a positive right FABER test.P  atient will benefit from skilled physical therapy intervention to address deficits and pain.    Personal Factors and Comorbidities  Comorbidity 2    Comorbidities  DM, HTN, prostate CA.    Examination-Activity Limitations  Stand    Clinical Decision Making  Low    Rehab Potential  Excellent    PT Frequency  2x / week    PT Duration  6 weeks     PT Treatment/Interventions  ADLs/Self Care Home Management;Cryotherapy;Electrical Stimulation;Ultrasound;Traction;Moist Heat;Functional mobility training;Therapeutic activities;Therapeutic exercise;Manual techniques;Patient/family education;Passive range of motion;Dry needling;Spinal Manipulations    PT Next Visit Plan  Combo e'stim/U/S and STW/M, core exercise progression.    Consulted and Agree with Plan of Care  Patient       Patient will benefit from skilled therapeutic intervention in order to improve the following deficits and impairments:     Visit Diagnosis: Acute right-sided low back pain with right-sided sciatica - Plan: PT plan of care cert/re-cert     Problem List Patient Active Problem List   Diagnosis Date Noted  . BMI 32.0-32.9,adult 08/28/2015  . Type 2 diabetes mellitus without complication ([place]) [id number]  . Hypertension 12/13/2012  . Hyperlipidemia 12/13/2012  . Prostate cancer ([place]) 06/29/2012    Brandilee Pies, [place] MPT 11/12/2019, 12:35 PM  [facility] [address] [place], [place], [address] Phone: [phone]   Fax:  [phone]  Name: Brandon Elliott MRN: [id number] Date of Birth: 11-07-1955

## 2019-11-12 NOTE — Telephone Encounter (Signed)
Patient informed that Brandon Elliott has given him two more weeks out of work and we will place a letter at the front desk for him to pick up.  New return to work date is 11/26/2019.

## 2019-11-13 ENCOUNTER — Ambulatory Visit: Payer: Managed Care, Other (non HMO) | Admitting: Family Medicine

## 2019-11-14 ENCOUNTER — Other Ambulatory Visit: Payer: Self-pay

## 2019-11-14 ENCOUNTER — Ambulatory Visit: Payer: Managed Care, Other (non HMO) | Admitting: Physical Therapy

## 2019-11-14 ENCOUNTER — Encounter: Payer: Self-pay | Admitting: Physical Therapy

## 2019-11-14 DIAGNOSIS — M5441 Lumbago with sciatica, right side: Secondary | ICD-10-CM

## 2019-11-14 NOTE — Therapy (Addendum)
[facility] [place], [place], [address] Phone: [phone]   Fax:  [phone]  Physical Therapy Treatment  Patient Details  Name: Brandon Elliott MRN: [id number] Date of Birth: 1956-03-30 Referring Provider (PT): Ronnald Collum   Encounter Date: 11/14/2019  PT End of Session - 11/14/19 1000    Visit Number  2    Number of Visits  12    Date for PT Re-Evaluation  11/19/19    Authorization Type  FOTO.    PT Start Time  0945    PT Stop Time  1035    PT Time Calculation (min)  50 min    Activity Tolerance  Patient tolerated treatment well    Behavior During Therapy  WFL for tasks assessed/performed       Past Medical History:  Diagnosis Date  . Bronchitis    DX 10-03-2012--  ON ANTIBIOTICS  . DM (diabetes mellitus), type 2 ([place])   . History of BPH    W/ BOO  . History of gastric ulcer 2010  . Hyperlipidemia   . Hypertension   . Prostate cancer ([place]) 06/29/12   Adenocarcinoma,gleason=3+4=7,& 3+3=6,PSA=4.23,volume=38cc    Past Surgical History:  Procedure Laterality Date  . RADIOACTIVE SEED IMPLANT N/A 10/16/2012   Procedure: RADIOACTIVE SEED IMPLANT;  Surgeon: Bernestine Amass, MD;  Location: [facility];  Service: Urology;  Laterality: N/A;  69 seeds implanted no seeds found in bladder     There were no vitals filed for this visit.  Subjective Assessment - 11/14/19 0952    Subjective  COVID-19 screen performed prior to patient entering clinic.  Pt reporting pain in R hip anywhere between 7-10/10. Pt reporting burning sensation when standing.    Pertinent History  DM, HTN, prostate CA.    How long can you stand comfortably?  ~10 minutes.    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    Currently in Pain?  Yes    Pain Score  7     Pain Location  Back    Pain Orientation  Right    Pain Descriptors / Indicators  Aching;Burning    Pain Type  Acute pain     Pain Onset  1 to 4 weeks ago                       [facility] Adult PT Treatment/Exercise - 11/14/19 0001      Exercises   Exercises  Knee/Hip      Knee/Hip Exercises: Stretches   Active Hamstring Stretch  Right;2 reps;30 seconds    ITB Stretch  Right;2 reps    ITB Stretch Limitations  passively    Other Knee/Hip Stretches  trunk rotation: x 2 reps to each side holding 30 seconds      Knee/Hip Exercises: Aerobic   Nustep  L4 x 6 minutes      Knee/Hip Exercises: Supine   Straight Leg Raises  AROM;Strengthening;Right;10 reps      Knee/Hip Exercises: Sidelying   Clams  10 reps R LE      Modalities   Modalities  Electrical Stimulation;Moist Heat      Moist Heat Therapy   Number Minutes Moist Heat  15 Minutes    Moist Heat Location  Lumbar Spine   R hip     Electrical Stimulation   Electrical Stimulation Location  R hip    Electrical Stimulation  Action  pre-mod    Electrical Stimulation Parameters  80-150 Hz     Electrical Stimulation Goals  Pain      Manual Therapy   Manual therapy comments  IASTM to R hip/glutes/ IT band              PT Education - 11/14/19 0959    Education Details  HEP, lumbar stretching, exercise technique    Person(s) Educated  Patient    Methods  Explanation;Demonstration    Comprehension  Verbalized understanding;Returned demonstration;Need further instruction          PT Long Term Goals - 11/14/19 1011      PT LONG TERM GOAL #1   Title  Independent with a HEP.    Time  6    Period  Weeks    Status  On-going      PT LONG TERM GOAL #2   Title  Stand 20 minutes with pain not > 3/10.    Status  On-going      PT LONG TERM GOAL #3   Title  Perform ADL's with pain not > 3/10.    Status  On-going      PT LONG TERM GOAL #4   Title  Eliminate right LE symptoms.    Status  On-going            Plan - 11/14/19 1001    Clinical Impression Statement  Pt arriving to therpay reporting 7-10/10 pain in his right  sided low back and hip. Pt reporting burning sensation. Long axis distraction performed with good response. IASTM performed to R IT band and glutes. Continue to progress toward goals met with core strengtheing, lumbar stretching and functional mobility.    Personal Factors and Comorbidities  Comorbidity 2    Comorbidities  DM, HTN, prostate CA.    Examination-Activity Limitations  Stand    Rehab Potential  Excellent    PT Frequency  2x / week    PT Duration  6 weeks    PT Treatment/Interventions  ADLs/Self Care Home Management;Cryotherapy;Electrical Stimulation;Ultrasound;Traction;Moist Heat;Functional mobility training;Therapeutic activities;Therapeutic exercise;Manual techniques;Patient/family education;Passive range of motion;Dry needling;Spinal Manipulations    PT Next Visit Plan  Combo e'stim/U/S and STW/M, core exercise progression.    PT Home Exercise Plan  Access Code: [email or web address]    Consulted and Agree with Plan of Care  Patient       Patient will benefit from skilled therapeutic intervention in order to improve the following deficits and impairments:     Visit Diagnosis: Acute right-sided low back pain with right-sided sciatica     Problem List Patient Active Problem List   Diagnosis Date Noted  . BMI 32.0-32.9,adult 08/28/2015  . Type 2 diabetes mellitus without complication ([place]) 02/63/7858  . Hypertension 12/13/2012  . Hyperlipidemia 12/13/2012  . Prostate cancer [facility]) 06/29/2012    Oretha Caprice, MPT 11/14/2019, 10:26 AM  [facility] [place], [place], [address] Phone: [phone]   Fax:  [phone]  Name: Brandon Elliott MRN: [id number] Date of Birth: Nov 18, 1955

## 2019-11-14 NOTE — Patient Instructions (Signed)
Access Code: [email or web address] URL: [email or web address] Date: 11/14/2019 Prepared by: Kearney Hard  Exercises Hooklying Hamstring Stretch with Strap - 2 x daily - 7 x weekly - 3 reps - 30 seocnds hold Supine Lower Trunk Rotation - 2 x daily - 7 x weekly - 3 reps - 20 seconds hold Supine ITB Stretch with Strap - 2 x daily - 7 x weekly - 3 reps - 20 seocnds hold

## 2019-11-19 ENCOUNTER — Encounter: Payer: Self-pay | Admitting: Physical Therapy

## 2019-11-19 ENCOUNTER — Ambulatory Visit: Payer: Managed Care, Other (non HMO) | Attending: Nurse Practitioner | Admitting: Physical Therapy

## 2019-11-19 ENCOUNTER — Other Ambulatory Visit: Payer: Self-pay

## 2019-11-19 DIAGNOSIS — M5441 Lumbago with sciatica, right side: Secondary | ICD-10-CM

## 2019-11-19 NOTE — Therapy (Signed)
[facility] [place], [place], [address] Phone: [phone]   Fax:  [phone]  Physical Therapy Treatment  Patient Details  Name: Brandon Elliott MRN: [id number] Date of Birth: [birth year] Referring Provider (PT): Ronnald Collum   Encounter Date: 11/19/2019  PT End of Session - 11/19/19 1002    Visit Number  3    Number of Visits  12    Date for PT Re-Evaluation  11/19/19    Authorization Type  FOTO.    PT Start Time  0945    PT Stop Time  1038    PT Time Calculation (min)  53 min    Activity Tolerance  Patient tolerated treatment well    Behavior During Therapy  WFL for tasks assessed/performed       Past Medical History:  Diagnosis Date  . Bronchitis    DX 10-03-2012--  ON ANTIBIOTICS  . DM (diabetes mellitus), type 2 ([place])   . History of BPH    W/ BOO  . History of gastric ulcer 2010  . Hyperlipidemia   . Hypertension   . Prostate cancer ([place]) 06/29/12   Adenocarcinoma,gleason=3+4=7,& 3+3=6,PSA=4.23,volume=38cc    Past Surgical History:  Procedure Laterality Date  . RADIOACTIVE SEED IMPLANT N/A 10/16/2012   Procedure: RADIOACTIVE SEED IMPLANT;  Surgeon: Bernestine Amass, MD;  Location: [facility];  Service: Urology;  Laterality: N/A;  69 seeds implanted no seeds found in bladder     There were no vitals filed for this visit.  Subjective Assessment - 11/19/19 1002    Subjective  COVID-19 screen performed prior to patient entering clinic.  Patient arrives feeling better but with ongoing pain more in the right hip.    Pertinent History  DM, HTN, prostate CA.    How long can you stand comfortably?  ~10 minutes.    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    Currently in Pain?  Yes    Pain Score  7     Pain Location  Back    Pain Orientation  Right    Pain Descriptors / Indicators  Aching;Burning    Pain Type  Acute pain    Pain Onset  1 to 4  weeks ago    Pain Frequency  Constant         [facility] PT Assessment - 11/19/19 0001      Assessment   Medical Diagnosis  Acute midline low back pain without sciatica.    Referring Provider (PT)  Ronnald Collum      Precautions   Precautions  None                   [facility] Adult PT Treatment/Exercise - 11/19/19 0001      Exercises   Exercises  Lumbar      Lumbar Exercises: Stretches   Single Knee to Chest Stretch  Right;Left;3 reps;30 seconds    Active Hamstring Stretch  Right;30 seconds;3 reps      Lumbar Exercises: Supine   Bridge  Compliant;10 reps      Knee/Hip Exercises: Stretches   Hip Flexor Stretch  Right;3 reps;20 seconds      Knee/Hip Exercises: Aerobic   Nustep  level 3 x10 mins      Knee/Hip Exercises: Sidelying   Clams  x15 reps bilaterally, no resistance      Modalities   Modalities  Electrical Stimulation;Moist Heat  Moist Heat Therapy   Number Minutes Moist Heat  10 Minutes    Moist Heat Location  Lumbar Spine      Electrical Stimulation   Electrical Stimulation Location  R hip    Electrical Stimulation Action  pre-mod    Electrical Stimulation Parameters  80-510 hz x10 mins    Electrical Stimulation Goals  Pain                  PT Long Term Goals - 11/14/19 1011      PT LONG TERM GOAL #1   Title  Independent with a HEP.    Time  6    Period  Weeks    Status  On-going      PT LONG TERM GOAL #2   Title  Stand 20 minutes with pain not > 3/10.    Status  On-going      PT LONG TERM GOAL #3   Title  Perform ADL's with pain not > 3/10.    Status  On-going      PT LONG TERM GOAL #4   Title  Eliminate right LE symptoms.    Status  On-going            Plan - 11/19/19 1243    Clinical Impression Statement  Patient arrived to physical therapy with ongoing pain more in right lateral hip than right low back. Patient was able to perform TEs with good response. Intermittent reports of burning but sensation dissipated  after modifications of TE. No adverse affects upon removal of modalities.    Personal Factors and Comorbidities  Comorbidity 2    Comorbidities  DM, HTN, prostate CA.    Examination-Activity Limitations  Stand    Clinical Decision Making  Low    Rehab Potential  Excellent    PT Frequency  2x / week    PT Duration  6 weeks    PT Treatment/Interventions  ADLs/Self Care Home Management;Cryotherapy;Electrical Stimulation;Ultrasound;Traction;Moist Heat;Functional mobility training;Therapeutic activities;Therapeutic exercise;Manual techniques;Patient/family education;Passive range of motion;Dry needling;Spinal Manipulations    PT Next Visit Plan  Combo e'stim/U/S and STW/M, core exercise progression.    PT Home Exercise Plan  Access Code: [email or web address]    Consulted and Agree with Plan of Care  Patient       Patient will benefit from skilled therapeutic intervention in order to improve the following deficits and impairments:     Visit Diagnosis: Acute right-sided low back pain with right-sided sciatica     Problem List Patient Active Problem List   Diagnosis Date Noted  . BMI 32.0-32.9,adult 08/28/2015  . Type 2 diabetes mellitus without complication ([place]) [id number]  . Hypertension 12/13/2012  . Hyperlipidemia 12/13/2012  . Prostate cancer [facility]) 06/29/2012    Gabriela Eves, PT, DPT 11/19/2019, 12:45 PM  [facility] [address] [place], [place], [address] Phone: [phone]   Fax:  [phone]  Name: Brandon Elliott MRN: [id number] Date of Birth: 1955/08/03

## 2019-11-21 ENCOUNTER — Ambulatory Visit: Payer: Managed Care, Other (non HMO) | Admitting: Physical Therapy

## 2019-11-21 ENCOUNTER — Other Ambulatory Visit: Payer: Self-pay

## 2019-11-21 ENCOUNTER — Telehealth: Payer: Self-pay | Admitting: Nurse Practitioner

## 2019-11-21 DIAGNOSIS — M5441 Lumbago with sciatica, right side: Secondary | ICD-10-CM

## 2019-11-21 NOTE — Telephone Encounter (Signed)
Pt called requesting a work note from [employer] stating whether he can or cant go back to work right now since he is still doing physical therapy and is on short term disability.

## 2019-11-21 NOTE — Telephone Encounter (Signed)
Please review and advise.

## 2019-11-21 NOTE — Therapy (Signed)
[facility] [place], [place], [address] Phone: [phone]   Fax:  [phone]  Physical Therapy Treatment  Patient Details  Name: Brandon Elliott MRN: [id number] Date of Birth: 03/11/1956 Referring Provider (PT): Ronnald Collum   Encounter Date: 11/21/2019  PT End of Session - 11/21/19 1236    Visit Number  4    Number of Visits  12    Date for PT Re-Evaluation  11/19/19    Authorization Type  FOTO.    PT Start Time  [phone]    PT Stop Time  1045    PT Time Calculation (min)  51 min    Activity Tolerance  Patient tolerated treatment well    Behavior During Therapy  WFL for tasks assessed/performed       Past Medical History:  Diagnosis Date  . Bronchitis    DX 10-03-2012--  ON ANTIBIOTICS  . DM (diabetes mellitus), type 2 ([place])   . History of BPH    W/ BOO  . History of gastric ulcer 2010  . Hyperlipidemia   . Hypertension   . Prostate cancer ([place]) 06/29/12   Adenocarcinoma,gleason=3+4=7,& 3+3=6,PSA=4.23,volume=38cc    Past Surgical History:  Procedure Laterality Date  . RADIOACTIVE SEED IMPLANT N/A 10/16/2012   Procedure: RADIOACTIVE SEED IMPLANT;  Surgeon: Bernestine Amass, MD;  Location: [facility];  Service: Urology;  Laterality: N/A;  69 seeds implanted no seeds found in bladder     There were no vitals filed for this visit.  Subjective Assessment - 11/21/19 1220    Subjective  COVID-19 screen performed prior to patient entering clinic.  I'm doing better but still hurting.    Pertinent History  DM, HTN, prostate CA.    How long can you stand comfortably?  ~10 minutes.    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    Currently in Pain?  Yes    Pain Score  7     Pain Location  Back    Pain Orientation  Right    Pain Descriptors / Indicators  Aching;Burning    Pain Type  Acute pain    Pain Onset  1 to 4 weeks ago                        [facility] Adult PT Treatment/Exercise - 11/21/19 0001      Exercises   Exercises  Lumbar      Lumbar Exercises: Aerobic   Nustep  Level 3 x 15 minutes with steps per minutes @ ~ 80.      Lumbar Exercises: Supine   Other Supine Lumbar Exercises  Single knee to chest bilaterally (4 minutes total), DKTC x 2 minutes and gentle truck rotation x 2 minutes.      Modalities   Modalities  Electrical Stimulation;Moist Heat      Moist Heat Therapy   Number Minutes Moist Heat  15 Minutes    Moist Heat Location  Lumbar Spine      Electrical Stimulation   Electrical Stimulation Location  LB    Electrical Stimulation Action  Pre-mod.    Electrical Stimulation Parameters  80-150 Hz x 15 minutes.    Electrical Stimulation Goals  Pain                  PT Long Term Goals - 11/14/19 1011      PT LONG  TERM GOAL #1   Title  Independent with a HEP.    Time  6    Period  Weeks    Status  On-going      PT LONG TERM GOAL #2   Title  Stand 20 minutes with pain not > 3/10.    Status  On-going      PT LONG TERM GOAL #3   Title  Perform ADL's with pain not > 3/10.    Status  On-going      PT LONG TERM GOAL #4   Title  Eliminate right LE symptoms.    Status  On-going            Plan - 11/21/19 1238    Clinical Impression Statement  Patient feels like the treatments are helping but still reports a pain-level of a 7/10.  He is concerned about returning to work at this time.    Personal Factors and Comorbidities  Comorbidity 2    Comorbidities  DM, HTN, prostate CA.    Examination-Activity Limitations  Stand    Stability/Clinical Decision Making  Evolving/Moderate complexity    Clinical Decision Making  Low    Rehab Potential  Excellent    PT Frequency  2x / week    PT Duration  6 weeks    PT Treatment/Interventions  ADLs/Self Care Home Management;Cryotherapy;Electrical Stimulation;Ultrasound;Traction;Moist Heat;Functional mobility  training;Therapeutic activities;Therapeutic exercise;Manual techniques;Patient/family education;Passive range of motion;Dry needling;Spinal Manipulations    PT Next Visit Plan  Combo e'stim/U/S and STW/M, core exercise progression.    Consulted and Agree with Plan of Care  Patient       Patient will benefit from skilled therapeutic intervention in order to improve the following deficits and impairments:     Visit Diagnosis: Acute right-sided low back pain with right-sided sciatica     Problem List Patient Active Problem List   Diagnosis Date Noted  . BMI 32.0-32.9,adult 08/28/2015  . Type 2 diabetes mellitus without complication ([place]) [id number]  . Hypertension 12/13/2012  . Hyperlipidemia 12/13/2012  . Prostate cancer ([place]) 06/29/2012    Brandon Elliott, Brandon Elliott 11/21/2019, 12:42 PM  [facility] [address] [place], [place], [address] Phone: [phone]   Fax:  [phone]  Name: Brandon Elliott MRN: [id number] Date of Birth: [birth year]

## 2019-11-21 NOTE — Telephone Encounter (Signed)
Out of work until PT says it is ok for him to return

## 2019-11-21 NOTE — Progress Notes (Signed)
Letter written and given to patient

## 2019-11-22 ENCOUNTER — Telehealth: Payer: Self-pay | Admitting: Nurse Practitioner

## 2019-11-22 ENCOUNTER — Emergency Department (HOSPITAL_COMMUNITY): Payer: Managed Care, Other (non HMO)

## 2019-11-22 ENCOUNTER — Emergency Department (HOSPITAL_COMMUNITY)
Admission: EM | Admit: 2019-11-22 | Discharge: 2019-11-22 | Disposition: A | Discharge status: Home / Self Care | Payer: Managed Care, Other (non HMO) | Attending: Emergency Medicine | Admitting: Emergency Medicine

## 2019-11-22 ENCOUNTER — Encounter (HOSPITAL_COMMUNITY): Payer: Self-pay

## 2019-11-22 ENCOUNTER — Other Ambulatory Visit: Payer: Self-pay

## 2019-11-22 DIAGNOSIS — Y939 Activity, unspecified: Secondary | ICD-10-CM | POA: Insufficient documentation

## 2019-11-22 DIAGNOSIS — T18128A Food in esophagus causing other injury, initial encounter: Secondary | ICD-10-CM | POA: Diagnosis present

## 2019-11-22 DIAGNOSIS — Z7984 Long term (current) use of oral hypoglycemic drugs: Secondary | ICD-10-CM | POA: Diagnosis not present

## 2019-11-22 DIAGNOSIS — E119 Type 2 diabetes mellitus without complications: Secondary | ICD-10-CM | POA: Diagnosis not present

## 2019-11-22 DIAGNOSIS — I10 Essential (primary) hypertension: Secondary | ICD-10-CM | POA: Insufficient documentation

## 2019-11-22 DIAGNOSIS — Y999 Unspecified external cause status: Secondary | ICD-10-CM | POA: Insufficient documentation

## 2019-11-22 DIAGNOSIS — Y929 Unspecified place or not applicable: Secondary | ICD-10-CM | POA: Diagnosis not present

## 2019-11-22 DIAGNOSIS — F17210 Nicotine dependence, cigarettes, uncomplicated: Secondary | ICD-10-CM | POA: Insufficient documentation

## 2019-11-22 DIAGNOSIS — X58XXXA Exposure to other specified factors, initial encounter: Secondary | ICD-10-CM | POA: Insufficient documentation

## 2019-11-22 DIAGNOSIS — Z79899 Other long term (current) drug therapy: Secondary | ICD-10-CM | POA: Diagnosis not present

## 2019-11-22 NOTE — ED Provider Notes (Signed)
[facility] EMERGENCY DEPARTMENT Provider Note   CSN: [id number] Arrival date & time: 11/22/19  2053     History Chief Complaint  Patient presents with  . possible food impaction    Brandon Elliott is a 64 y.o. male.  Patient was eating barbecue around 8 PM.  When it got stuck and of at the base of his neck kind of around his thyroid gland area.  He was able to vomit lots of the barbecue back up.  Patient now keeping saliva down.  Still feels as if something is there at the base of his neck area.  Patient without any trouble speaking.  Patient has not had this happen before.        Past Medical History:  Diagnosis Date  . Bronchitis    DX 10-03-2012--  ON ANTIBIOTICS  . DM (diabetes mellitus), type 2 ([place])   . History of BPH    W/ BOO  . History of gastric ulcer 2010  . Hyperlipidemia   . Hypertension   . Prostate cancer ([place]) 06/29/12   Adenocarcinoma,gleason=3+4=7,& 3+3=6,PSA=4.23,volume=38cc    Patient Active Problem List   Diagnosis Date Noted  . BMI 32.0-32.9,adult 08/28/2015  . Type 2 diabetes mellitus without complication ([place]) [id number]  . Hypertension 12/13/2012  . Hyperlipidemia 12/13/2012  . Prostate cancer ([place]) 06/29/2012    Past Surgical History:  Procedure Laterality Date  . RADIOACTIVE SEED IMPLANT N/A 10/16/2012   Procedure: RADIOACTIVE SEED IMPLANT;  Surgeon: Bernestine Amass, MD;  Location: [facility];  Service: Urology;  Laterality: N/A;  43 seeds implanted no seeds found in bladder        Family History  Problem Relation Age of Onset  . Stroke Mother   . Cancer Father        prostate    Social History   Tobacco Use  . Smoking status: Current Every Day Smoker    Packs/day: 0.50    Years: 30.00    Pack years: 15.00    Types: Cigarettes  . Smokeless tobacco: Never Used  Substance Use Topics  . Alcohol use: Yes    Alcohol/week: 35.0 standard drinks    Types: 35 Cans of beer per week    Comment: 5-6 cans beer per  day  . Drug use: No    Home Medications Prior to Admission medications   Medication Sig Start Date End Date Taking? Authorizing Provider  amLODipine (NORVASC) 5 MG tablet TAKE 1 TABLET BY MOUTH ONCE DAILY NEEDS  OFFICE  VISIT  FOR  FURTHER  REFILLS 10/22/19   Chevis Pretty, FNP  atorvastatin (LIPITOR) 40 MG tablet TAKE 1 TABLET BY MOUTH ONCE DAILY IN THE MORNING 10/22/19   Hassell Done, Mary-Margaret, FNP  cyclobenzaprine (FLEXERIL) 10 MG tablet Take 1 tablet (10 mg total) by mouth 3 (three) times daily as needed for muscle spasms. 10/22/19   Hassell Done, Mary-Margaret, FNP  fenofibrate (TRICOR) 145 MG tablet TAKE 1 TABLET BY MOUTH ONCE DAILY NEEDS  OFFICE  VISIT  BEFORE  NEXT  REFILL 10/22/19   Chevis Pretty, FNP  lisinopril-hydrochlorothiazide (ZESTORETIC) 20-25 MG tablet Take 2 tablets by mouth daily. 10/22/19   Hassell Done Mary-Margaret, FNP  naproxen (NAPROSYN) 500 MG tablet Take 1 tablet (500 mg total) by mouth 2 (two) times daily with a meal. 10/22/19   Hassell Done, Mary-Margaret, FNP  predniSONE (STERAPRED UNI-PAK 21 TAB) 10 MG (21) TBPK tablet As directed x 6 days 11/08/19   Chevis Pretty, FNP  sitaGLIPtin-metformin (JANUMET) 50-1000 MG tablet Take  1 tablet by mouth 2 (two) times daily with a meal. 10/22/19   Hassell Done, Mary-Margaret, FNP  tamsulosin (FLOMAX) 0.4 MG CAPS capsule Take 1 capsule (0.4 mg total) by mouth daily. 10/22/19   Chevis Pretty, FNP    Allergies    Patient has no known allergies.  Review of Systems   Review of Systems  Constitutional: Negative for chills and fever.  HENT: Positive for trouble swallowing. Negative for congestion, rhinorrhea, sore throat and voice change.   Eyes: Negative for visual disturbance.  Respiratory: Negative for cough and shortness of breath.   Cardiovascular: Negative for chest pain and leg swelling.  Gastrointestinal: Negative for abdominal pain, diarrhea, nausea and vomiting.  Genitourinary: Negative for dysuria.  Musculoskeletal:  Negative for back pain and neck pain.  Skin: Negative for rash.  Neurological: Negative for dizziness, light-headedness and headaches.  Hematological: Does not bruise/bleed easily.  Psychiatric/Behavioral: Negative for confusion.    Physical Exam Updated Vital Signs BP 118/76 (BP Location: Right Arm)   Pulse (!) 111   Temp 98.3 F (36.8 C) (Oral)   Resp 20   SpO2 99%   Physical Exam Vitals and nursing note reviewed.  Constitutional:      General: He is not in acute distress.    Appearance: Normal appearance. He is well-developed. He is not diaphoretic.  HENT:     Head: Normocephalic and atraumatic.     Mouth/Throat:     Mouth: Mucous membranes are moist.     Pharynx: Oropharynx is clear.     Comments: No foreign body Eyes:     Conjunctiva/sclera: Conjunctivae normal.  Cardiovascular:     Rate and Rhythm: Normal rate and regular rhythm.     Heart sounds: No murmur.  Pulmonary:     Effort: Pulmonary effort is normal. No respiratory distress.     Breath sounds: Normal breath sounds. No wheezing.  Abdominal:     Palpations: Abdomen is soft.     Tenderness: There is no abdominal tenderness.  Musculoskeletal:        General: Normal range of motion.     Cervical back: Neck supple.  Skin:    General: Skin is warm and dry.     Capillary Refill: Capillary refill takes less than 2 seconds.  Neurological:     General: No focal deficit present.     Mental Status: He is alert and oriented to person, place, and time.     ED Results / Procedures / Treatments   Labs (all labs ordered are listed, but only abnormal results are displayed) Labs Reviewed - No data to display  EKG None  Radiology No results found.  Procedures Procedures (including critical care time)  Medications Ordered in ED Medications - No data to display  ED Course  I have reviewed the triage vital signs and the nursing notes.  Pertinent labs & imaging results that were available during my care of  the patient were reviewed by me and considered in my medical decision making (see chart for details).    MDM Rules/Calculators/A&P                      Patient now feeling much better.  Able to drink water here.  Able to drink soft drink and able to eat crackers.  Think the food impaction is cleared.  Patient speaking fine breathing fine.  Chest x-ray without any acute abnormalities other than a density over the right chest which may be nipple density.  Radiology recommending outpatient follow-up with chest x-ray PA and lateral.  May be with nipple markers.  But do not feel it needs to be done tonight.  We will also give patient referral to GI medicine.  Will have patient follow back up with primary care doctor.  It appears that the food impaction has cleared itself.  Patient nontoxic no acute distress.  Patient comfortable.    Final Clinical Impression(s) / ED Diagnoses Final diagnoses:  None    Rx / DC Orders ED Discharge Orders    None       Fredia Sorrow, MD 11/22/19 2200

## 2019-11-22 NOTE — ED Triage Notes (Signed)
Pt was eating some barbecue approx 1 hour ago and now feels like the food is stuck in his throat.  Pt is sipping water and at this time is able to keep it down, but states he still feels the fullness in his throat.

## 2019-11-22 NOTE — ED Notes (Signed)
Pt transported to radiology.

## 2019-11-22 NOTE — Telephone Encounter (Signed)
FMLA faxed to company

## 2019-11-22 NOTE — Discharge Instructions (Addendum)
Return for any new or worse symptoms.  Chest x-ray showed some scarring on the right side of the lung.  Follow back up with your primary care doctor for repeat chest x-ray.  This could be a shadow from your right nipple.  Make an appointment to follow-up with GI medicine regarding the now resolved food impaction that occurred this evening.  Can also follow-up with your primary care doctor for that as well.

## 2019-11-22 NOTE — ED Notes (Signed)
Pt provided soda and crackers to assess Pts ability to swallow liquids and solids per MD request. Pt tolerating PO liquids and food without complication at this time.

## 2019-11-26 ENCOUNTER — Ambulatory Visit: Payer: Managed Care, Other (non HMO) | Admitting: Physical Therapy

## 2019-11-26 ENCOUNTER — Encounter: Payer: Self-pay | Admitting: Physical Therapy

## 2019-11-26 ENCOUNTER — Other Ambulatory Visit: Payer: Self-pay

## 2019-11-26 DIAGNOSIS — M5441 Lumbago with sciatica, right side: Secondary | ICD-10-CM | POA: Diagnosis not present

## 2019-11-26 NOTE — Therapy (Signed)
[facility] [place], [place], [address] Phone: [phone]   Fax:  [phone]  Physical Therapy Treatment  Patient Details  Name: Brandon Elliott MRN: [id number] Date of Birth: 04/26/1956 Referring Provider (PT): Ronnald Collum   Encounter Date: 11/26/2019  PT End of Session - 11/26/19 1027    Visit Number  5    Number of Visits  12    Date for PT Re-Evaluation  11/19/19    Authorization Type  FOTO.    PT Start Time  [phone]    PT Stop Time  1040    PT Time Calculation (min)  49 min    Activity Tolerance  Patient tolerated treatment well    Behavior During Therapy  WFL for tasks assessed/performed       Past Medical History:  Diagnosis Date  . Bronchitis    DX 10-03-2012--  ON ANTIBIOTICS  . DM (diabetes mellitus), type 2 ([place])   . History of BPH    W/ BOO  . History of gastric ulcer 2010  . Hyperlipidemia   . Hypertension   . Prostate cancer ([place]) 06/29/12   Adenocarcinoma,gleason=3+4=7,& 3+3=6,PSA=4.23,volume=38cc    Past Surgical History:  Procedure Laterality Date  . RADIOACTIVE SEED IMPLANT N/A 10/16/2012   Procedure: RADIOACTIVE SEED IMPLANT;  Surgeon: Bernestine Amass, MD;  Location: [facility];  Service: Urology;  Laterality: N/A;  69 seeds implanted no seeds found in bladder     There were no vitals filed for this visit.  Subjective Assessment - 11/26/19 1020    Subjective  COVID-19 screen performed prior to patient entering clinic.  Pain a little better today.    Pertinent History  DM, HTN, prostate CA.    How long can you stand comfortably?  ~10 minutes.    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    Pain Score  5     Pain Location  Back    Pain Orientation  Right    Pain Descriptors / Indicators  Aching;Burning    Pain Onset  1 to 4 weeks ago                       [facility] Adult PT Treatment/Exercise - 11/26/19 0001      Exercises   Exercises  Lumbar      Lumbar Exercises: Aerobic   Nustep  Level 4 x 15 minutes.      Lumbar Exercises: Supine   Other Supine Lumbar Exercises  In supine:  S and DKTC and bilateral trunk rottaion with long holds (total:  8 minutes.).      Modalities   Modalities  Electrical Stimulation;Moist Heat      Moist Heat Therapy   Number Minutes Moist Heat  20 Minutes    Moist Heat Location  Lumbar Spine      Electrical Stimulation   Electrical Stimulation Location  LB    Electrical Stimulation Action  IFC    Electrical Stimulation Parameters  80-150 Hz x 20 minutes.    Electrical Stimulation Goals  Pain                  PT Long Term Goals - 11/14/19 1011      PT LONG TERM GOAL #1   Title  Independent with a HEP.    Time  6    Period  Weeks    Status  On-going      PT LONG TERM GOAL #2   Title  Stand 20 minutes with pain not > 3/10.    Status  On-going      PT LONG TERM GOAL #3   Title  Perform ADL's with pain not > 3/10.    Status  On-going      PT LONG TERM GOAL #4   Title  Eliminate right LE symptoms.    Status  On-going            Plan - 11/26/19 1025    Clinical Impression Statement  Patient preesents to the clinic today with a 2 point pain-level decrease.  He is very motivated with ther ex.  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    Personal Factors and Comorbidities  Comorbidity 2    Comorbidities  DM, HTN, prostate CA.    Examination-Activity Limitations  Stand    Stability/Clinical Decision Making  Evolving/Moderate complexity    Rehab Potential  Excellent    PT Frequency  2x / week    PT Duration  6 weeks    PT Treatment/Interventions  ADLs/Self Care Home Management;Cryotherapy;Electrical Stimulation;Ultrasound;Traction;Moist Heat;Functional mobility training;Therapeutic activities;Therapeutic exercise;Manual techniques;Patient/family education;Passive range of motion;Dry needling;Spinal  Manipulations    PT Next Visit Plan  Combo e'stim/U/S and STW/M, core exercise progression.    PT Home Exercise Plan  Access Code: [email or web address]    Consulted and Agree with Plan of Care  Patient       Patient will benefit from skilled therapeutic intervention in order to improve the following deficits and impairments:     Visit Diagnosis: Acute right-sided low back pain with right-sided sciatica     Problem List Patient Active Problem List   Diagnosis Date Noted  . BMI 32.0-32.9,adult 08/28/2015  . Type 2 diabetes mellitus without complication ([place]) [id number]  . Hypertension 12/13/2012  . Hyperlipidemia 12/13/2012  . Prostate cancer ([place]) 06/29/2012    Lakecia Deschamps, [place] MPT 11/26/2019, 11:57 AM  [facility] [address] [place], [place], [address] Phone: [phone]   Fax:  [phone]  Name: Brandon Elliott MRN: [id number] Date of Birth: 12/31/1955

## 2019-11-27 ENCOUNTER — Telehealth: Payer: Self-pay | Admitting: Nurse Practitioner

## 2019-11-27 NOTE — Telephone Encounter (Signed)
Patient has a full voice mail and unable to leave message.  Per staff, FMLA papers were faxed to his work last Thursday.  He should check with his work place for information.

## 2019-11-28 ENCOUNTER — Encounter: Payer: Self-pay | Admitting: Physical Therapy

## 2019-11-28 ENCOUNTER — Other Ambulatory Visit: Payer: Self-pay

## 2019-11-28 ENCOUNTER — Ambulatory Visit: Payer: Managed Care, Other (non HMO) | Admitting: Physical Therapy

## 2019-11-28 DIAGNOSIS — M5441 Lumbago with sciatica, right side: Secondary | ICD-10-CM

## 2019-11-28 NOTE — Therapy (Signed)
[facility] [place], [place], [address] Phone: [phone]   Fax:  [phone]  Physical Therapy Treatment  Patient Details  Name: Brandon Elliott MRN: [id number] Date of Birth: [birth year] Referring Provider (PT): Ronnald Collum   Encounter Date: 11/28/2019  PT End of Session - 11/28/19 1012    Visit Number  6    Number of Visits  12    Date for PT Re-Evaluation  11/19/19    Authorization Type  FOTO 6th visit: 42% limitation; progress note every 10th visit    PT Start Time  0945    PT Stop Time  1033    PT Time Calculation (min)  48 min    Activity Tolerance  Patient tolerated treatment well    Behavior During Therapy  [facility] for tasks assessed/performed       Past Medical History:  Diagnosis Date  . Bronchitis    DX 10-03-2012--  ON ANTIBIOTICS  . DM (diabetes mellitus), type 2 ([place])   . History of BPH    W/ BOO  . History of gastric ulcer 2010  . Hyperlipidemia   . Hypertension   . Prostate cancer ([place]) 06/29/12   Adenocarcinoma,gleason=3+4=7,& 3+3=6,PSA=4.23,volume=38cc    Past Surgical History:  Procedure Laterality Date  . RADIOACTIVE SEED IMPLANT N/A 10/16/2012   Procedure: RADIOACTIVE SEED IMPLANT;  Surgeon: Bernestine Amass, MD;  Location: [facility];  Service: Urology;  Laterality: N/A;  69 seeds implanted no seeds found in bladder     There were no vitals filed for this visit.  Subjective Assessment - 11/28/19 0954    Subjective  COVID-19 screen performed prior to patient entering clinic. Reports feeling about a 5/10 today. Symptoms still down right LE to about the knee.    Pertinent History  DM, HTN, prostate CA.    How long can you stand comfortably?  ~10 minutes.    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    Currently in Pain?  Yes    Pain Score  5     Pain Location  Back    Pain Orientation  Right    Pain Onset  1 to 4 weeks ago          [facility] PT Assessment - 11/28/19 0001      Assessment   Medical Diagnosis  Acute midline low back pain without sciatica.    Referring Provider (PT)  Ronnald Collum      Precautions   Precautions  None                   [facility] Adult PT Treatment/Exercise - 11/28/19 0001      Exercises   Exercises  Lumbar      Lumbar Exercises: Stretches   Lower Trunk Rotation  2 reps;30 seconds      Lumbar Exercises: Aerobic   Nustep  Level 4 x 15 minutes.      Lumbar Exercises: Standing   Other Standing Lumbar Exercises  gentle lumbar extension 2x5    Other Standing Lumbar Exercises  standing forward flexion stretch 2x 30"      Knee/Hip Exercises: Supine   Bridges  AROM;Both;2 sets;10 reps      Modalities   Modalities  Electrical Stimulation;Moist Heat      Moist Heat Therapy   Number Minutes Moist Heat  10 Minutes    Moist Heat Location  Lumbar Spine  Acupuncturist Location  low back    Electrical Stimulation Action  Pre-mod    Electrical Stimulation Parameters  80-150 hz x10 mins    Electrical Stimulation Goals  Pain                  PT Long Term Goals - 11/14/19 1011      PT LONG TERM GOAL #1   Title  Independent with a HEP.    Time  6    Period  Weeks    Status  On-going      PT LONG TERM GOAL #2   Title  Stand 20 minutes with pain not > 3/10.    Status  On-going      PT LONG TERM GOAL #3   Title  Perform ADL's with pain not > 3/10.    Status  On-going      PT LONG TERM GOAL #4   Title  Eliminate right LE symptoms.    Status  On-going            Plan - 11/28/19 1024    Clinical Impression Statement  Patient responded fairly well to therapy session. Reported an increase stretching sensation but minimal reports of increased pain. Neither flexion or extension based TEs decreased neurological symptoms down right LE. Patient's FOTO limitation at 42%. No adverse affects upon removal of modalities.     Personal Factors and Comorbidities  Comorbidity 2    Comorbidities  DM, HTN, prostate CA.    Examination-Activity Limitations  Stand    Stability/Clinical Decision Making  Evolving/Moderate complexity    Clinical Decision Making  Low    Rehab Potential  Excellent    PT Frequency  2x / week    PT Duration  6 weeks    PT Treatment/Interventions  ADLs/Self Care Home Management;Cryotherapy;Electrical Stimulation;Ultrasound;Traction;Moist Heat;Functional mobility training;Therapeutic activities;Therapeutic exercise;Manual techniques;Patient/family education;Passive range of motion;Dry needling;Spinal Manipulations    PT Next Visit Plan  Continue with core exercise progression. Modalities PRN for pain relief    Consulted and Agree with Plan of Care  Patient       Patient will benefit from skilled therapeutic intervention in order to improve the following deficits and impairments:     Visit Diagnosis: Acute right-sided low back pain with right-sided sciatica     Problem List Patient Active Problem List   Diagnosis Date Noted  . BMI 32.0-32.9,adult 08/28/2015  . Type 2 diabetes mellitus without complication ([place]) [id number]  . Hypertension 12/13/2012  . Hyperlipidemia 12/13/2012  . Prostate cancer [facility]) 06/29/2012    Gabriela Eves, PT, DPT 11/28/2019, 12:59 PM  [facility] [place], [place], [address] Phone: [phone]   Fax:  [phone]  Name: Brandon Elliott MRN: [id number] Date of Birth: 02-Jul-1956

## 2019-11-30 NOTE — Telephone Encounter (Signed)
Aware. 

## 2019-12-03 ENCOUNTER — Ambulatory Visit: Payer: Managed Care, Other (non HMO) | Admitting: Physical Therapy

## 2019-12-03 ENCOUNTER — Other Ambulatory Visit: Payer: Self-pay

## 2019-12-03 DIAGNOSIS — M5441 Lumbago with sciatica, right side: Secondary | ICD-10-CM | POA: Diagnosis not present

## 2019-12-03 NOTE — Therapy (Signed)
[facility] [place], [place], [address] Phone: [phone]   Fax:  [phone]  Physical Therapy Treatment  Patient Details  Name: Brandon Elliott MRN: [id number] Date of Birth: 10/21/1955 Referring Provider (PT): Ronnald Collum   Encounter Date: 12/03/2019  PT End of Session - 12/03/19 1155    Visit Number  7    Number of Visits  12    Date for PT Re-Evaluation  11/19/19    Authorization Type  FOTO 6th visit: 42% limitation; progress note every 10th visit    PT Start Time  0945    PT Stop Time  1035    PT Time Calculation (min)  50 min       Past Medical History:  Diagnosis Date  . Bronchitis    DX 10-03-2012--  ON ANTIBIOTICS  . DM (diabetes mellitus), type 2 ([place])   . History of BPH    W/ BOO  . History of gastric ulcer 2010  . Hyperlipidemia   . Hypertension   . 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 RADIOACTIVE SEED IMPLANT N/A 10/16/2012   Procedure: RADIOACTIVE SEED IMPLANT;  Surgeon: Bernestine Amass, MD;  Location: [facility];  Service: Urology;  Laterality: N/A;  69 seeds implanted no seeds found in bladder     There were no vitals filed for this visit.  Subjective Assessment - 12/03/19 1155    Subjective  COVID-19 screen performed prior to patient entering clinic.  I'm getting better.    Pertinent History  DM, HTN, prostate CA.    How long can you stand comfortably?  ~10 minutes.    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    Currently in Pain?  Yes    Pain Score  4     Pain Location  Back    Pain Orientation  Right    Pain Descriptors / Indicators  Aching;Burning    Pain Type  Acute pain                        [facility] Adult PT Treatment/Exercise - 12/03/19 0001      Exercises   Exercises  Knee/Hip      Lumbar Exercises: Aerobic   Nustep  Level 4 x 16 minutes.      Modalities   Modalities  Electrical Stimulation      Moist Heat Therapy   Number Minutes Moist Heat  20 Minutes    Moist Heat Location  Lumbar Spine      Electrical Stimulation   Electrical Stimulation Location  Right low back.    Electrical Stimulation Action  Pre-mod.    Electrical Stimulation Parameters  80-150 Hz x 20 minutes.    Electrical Stimulation Goals  Pain      Manual Therapy   Manual Therapy  Soft tissue mobilization    Soft tissue mobilization  Left sdly position with folded pillow between knees for comfort:  STW/M x 7 minutes to patient's rightlow back musculature.                  PT Long Term Goals - 11/14/19 1011      PT LONG TERM GOAL #1   Title  Independent with a HEP.    Time  6    Period  Weeks  Status  On-going      PT LONG TERM GOAL #2   Title  Stand 20 minutes with pain not > 3/10.    Status  On-going      PT LONG TERM GOAL #3   Title  Perform ADL's with pain not > 3/10.    Status  On-going      PT LONG TERM GOAL #4   Title  Eliminate right LE symptoms.    Status  On-going            Plan - 12/03/19 1200    Clinical Impression Statement  Patient reporting he is doing better with a reduction in pain.  He did well with STW/M to his right low back today and felt better following treatment.    Personal Factors and Comorbidities  Comorbidity 2    Comorbidities  DM, HTN, prostate CA.    Examination-Activity Limitations  Stand    Stability/Clinical Decision Making  Evolving/Moderate complexity       Patient will benefit from skilled therapeutic intervention in order to improve the following deficits and impairments:     Visit Diagnosis: Acute right-sided low back pain with right-sided sciatica     Problem List Patient Active Problem List   Diagnosis Date Noted  . BMI 32.0-32.9,adult 08/28/2015  . Type 2 diabetes mellitus without complication ([place])  [id number]  . Hypertension 12/13/2012  . Hyperlipidemia 12/13/2012  . Prostate cancer ([place]) 06/29/2012    Brandon Elliott, [place] MPT 12/03/2019, 12:01 PM  [facility] [address] [place], [place], [address] Phone: [phone]   Fax:  [phone]  Name: Brandon Elliott MRN: [id number] Date of Birth: Aug 06, 1955

## 2019-12-05 ENCOUNTER — Other Ambulatory Visit: Payer: Self-pay

## 2019-12-05 ENCOUNTER — Ambulatory Visit: Payer: Managed Care, Other (non HMO) | Admitting: Physical Therapy

## 2019-12-05 ENCOUNTER — Encounter: Payer: Self-pay | Admitting: Physical Therapy

## 2019-12-05 DIAGNOSIS — M5441 Lumbago with sciatica, right side: Secondary | ICD-10-CM

## 2019-12-05 NOTE — Therapy (Signed)
[facility] [place], [place], [address] Phone: [phone]   Fax:  [phone]  Physical Therapy Treatment  Patient Details  Name: Brandon Elliott MRN: [id number] Date of Birth: [birth year] Referring Provider (PT): Ronnald Collum   Encounter Date: 12/05/2019  PT End of Session - 12/05/19 1123    Visit Number  8    Number of Visits  12    Date for PT Re-Evaluation  11/19/19    Authorization Type  FOTO 6th visit: 42% limitation; progress note every 10th visit    PT Start Time  0945    PT Stop Time  1038    PT Time Calculation (min)  53 min    Activity Tolerance  Patient tolerated treatment well    Behavior During Therapy  [facility] for tasks assessed/performed       Past Medical History:  Diagnosis Date  . Bronchitis    DX 10-03-2012--  ON ANTIBIOTICS  . DM (diabetes mellitus), type 2 ([place])   . History of BPH    W/ BOO  . History of gastric ulcer 2010  . Hyperlipidemia   . Hypertension   . Prostate cancer ([place]) 06/29/12   Adenocarcinoma,gleason=3+4=7,& 3+3=6,PSA=4.23,volume=38cc    Past Surgical History:  Procedure Laterality Date  . RADIOACTIVE SEED IMPLANT N/A 10/16/2012   Procedure: RADIOACTIVE SEED IMPLANT;  Surgeon: Bernestine Amass, MD;  Location: [facility];  Service: Urology;  Laterality: N/A;  69 seeds implanted no seeds found in bladder     There were no vitals filed for this visit.  Subjective Assessment - 12/05/19 1113    Subjective  COVID-19 screen performed prior to patient entering clinic.  Doing okay.    Pertinent History  DM, HTN, prostate CA.    How long can you stand comfortably?  ~10 minutes.    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    Currently in Pain?  Yes    Pain Score  4     Pain Location  Back    Pain Descriptors / Indicators  Aching;Burning    Pain Type  Acute pain    Pain Onset  1 to 4 weeks ago                         [facility] Adult PT Treatment/Exercise - 12/05/19 0001      Exercises   Exercises  Knee/Hip      Lumbar Exercises: Aerobic   Nustep  Level 4 x 16 minutes.      Lumbar Exercises: Machines for Strengthening   Cybex Lumbar Extension  60# x 4 minutes.    Other Lumbar Machine Exercise  Ab machine with 60# x 4 minutes.      Modalities   Modalities  Electrical Stimulation;Moist Heat      Moist Heat Therapy   Number Minutes Moist Heat  20 Minutes    Moist Heat Location  Lumbar Spine      Electrical Stimulation   Electrical Stimulation Location  RT LB.    Electrical Stimulation Action  Pre-mod.    Electrical Stimulation Parameters  80-150 Hz x 20 minutes.    Electrical Stimulation Goals  Pain                  PT Long Term Goals - 11/14/19 1011      PT LONG TERM GOAL #1   Title  Independent with a HEP.    Time  6    Period  Weeks    Status  On-going      PT LONG TERM GOAL #2   Title  Stand 20 minutes with pain not > 3/10.    Status  On-going      PT LONG TERM GOAL #3   Title  Perform ADL's with pain not > 3/10.    Status  On-going      PT LONG TERM GOAL #4   Title  Eliminate right LE symptoms.    Status  On-going            Plan - 12/05/19 1124    Clinical Impression Statement  Patient doing very well.  Progressing to resisted lumbar and ab machine without complaint today.    Personal Factors and Comorbidities  Comorbidity 2    Comorbidities  DM, HTN, prostate CA.    Examination-Activity Limitations  Stand    Stability/Clinical Decision Making  Evolving/Moderate complexity    Rehab Potential  Excellent    PT Frequency  2x / week    PT Duration  6 weeks    PT Treatment/Interventions  ADLs/Self Care Home Management;Cryotherapy;Electrical Stimulation;Ultrasound;Traction;Moist Heat;Functional mobility training;Therapeutic activities;Therapeutic exercise;Manual techniques;Patient/family education;Passive range of motion;Dry  needling;Spinal Manipulations    PT Next Visit Plan  Continue with core exercise progression. Modalities PRN for pain relief    Consulted and Agree with Plan of Care  Patient       Patient will benefit from skilled therapeutic intervention in order to improve the following deficits and impairments:     Visit Diagnosis: Acute right-sided low back pain with right-sided sciatica     Problem List Patient Active Problem List   Diagnosis Date Noted  . BMI 32.0-32.9,adult 08/28/2015  . Type 2 diabetes mellitus without complication ([place]) A999333  . Hypertension 12/13/2012  . Hyperlipidemia 12/13/2012  . Prostate cancer ([place]) 06/29/2012    Rindi Beechy, [place] MPT 12/05/2019, 11:26 AM  [facility] [address] [place], [place], [address] Phone: [phone]   Fax:  [phone]  Name: ELIJIAH ALTOMARI MRN: [id number] Date of Birth: [birth year]

## 2019-12-10 ENCOUNTER — Ambulatory Visit: Payer: Managed Care, Other (non HMO) | Admitting: Nurse Practitioner

## 2019-12-10 ENCOUNTER — Ambulatory Visit: Payer: Managed Care, Other (non HMO) | Admitting: Physical Therapy

## 2019-12-10 ENCOUNTER — Encounter: Payer: Self-pay | Admitting: Nurse Practitioner

## 2019-12-10 ENCOUNTER — Other Ambulatory Visit: Payer: Self-pay

## 2019-12-10 ENCOUNTER — Encounter: Payer: Self-pay | Admitting: Physical Therapy

## 2019-12-10 VITALS — BP 139/69 | HR 97 | Temp 98.5°F | Resp 20 | Ht 66.0 in | Wt 173.0 lb

## 2019-12-10 DIAGNOSIS — N3001 Acute cystitis with hematuria: Secondary | ICD-10-CM

## 2019-12-10 DIAGNOSIS — R300 Dysuria: Secondary | ICD-10-CM | POA: Diagnosis not present

## 2019-12-10 DIAGNOSIS — M545 Low back pain, unspecified: Secondary | ICD-10-CM

## 2019-12-10 DIAGNOSIS — M5441 Lumbago with sciatica, right side: Secondary | ICD-10-CM | POA: Diagnosis not present

## 2019-12-10 LAB — URINALYSIS, COMPLETE
Bilirubin, UA: NEGATIVE
Glucose, UA: NEGATIVE
Ketones, UA: NEGATIVE
Nitrite, UA: POSITIVE — AB
Specific Gravity, UA: 1.025 (ref 1.005–1.030)
Urobilinogen, Ur: 0.2 mg/dL (ref 0.2–1.0)
pH, UA: 5.5 (ref 5.0–7.5)

## 2019-12-10 LAB — MICROSCOPIC EXAMINATION
Renal Epithel, UA: NONE SEEN /hpf
WBC, UA: >30 /hpf — AB (ref 0–5)

## 2019-12-10 MED ORDER — SULFAMETHOXAZOLE-TRIMETHOPRIM 800-160 MG PO TABS: 1.0000 | ORAL_TABLET | Freq: Two times a day (BID) | ORAL | 0 refills | Status: DC

## 2019-12-10 NOTE — Progress Notes (Signed)
Subjective:    Patient ID: Brandon Elliott, male    DOB: 26-Sep-1955, 64 y.o.   MRN: [id number]   Chief Complaint: Discuss short term disability and Dysuria   HPI  Patient comes in with 2 issues:  - Patient moved a refrigerator and injured his back. He has been out of work since APril 17. He has had physical therapy and has one more session at the end of this week. Hi back I much better, but till has ome pain. Rate [place] 2/10. To much activity will increase it some. - has slight dysuria- started 2 days ago and has gradually gotten worsre.   Review of Systems  Constitutional: Negative for diaphoresis.  Eyes: Negative for pain.  Respiratory: Negative for shortness of breath.   Cardiovascular: Negative for chest pain, palpitations and leg swelling.  Gastrointestinal: Negative for abdominal pain.  Endocrine: Negative for polydipsia.  Genitourinary: Positive for dysuria, frequency and urgency.  Skin: Negative for rash.  Neurological: Negative for dizziness, weakness and headaches.  Hematological: Does not bruise/bleed easily.  All other systems reviewed and are negative.      Objective:   Physical Exam Vitals and nursing note reviewed.  Constitutional:      Appearance: Normal appearance. He is well-developed.  HENT:     Head: Normocephalic.     Nose: Nose normal.  Eyes:     Pupils: Pupils are equal, round, and reactive to light.  Neck:     Thyroid: No thyroid mass or thyromegaly.     Vascular: No carotid bruit or JVD.     Trachea: Phonation normal.  Cardiovascular:     Rate and Rhythm: Normal rate and regular rhythm.  Pulmonary:     Effort: Pulmonary effort is normal. No respiratory distress.     Breath sounds: Normal breath sounds.  Abdominal:     General: Bowel sounds are normal.     Palpations: Abdomen is soft.     Tenderness: There is no abdominal tenderness.  Musculoskeletal:        General: Normal range of motion.     Cervical back: Normal range of motion and  neck supple.     Comments: FROM of lumbar ine with mild pain on flexion  Lymphadenopathy:     Cervical: No cervical adenopathy.  Skin:    General: Skin is warm and dry.  Neurological:     Mental Status: He is alert and oriented to person, place, and time.  Psychiatric:        Behavior: Behavior normal.        Thought Content: Thought content normal.        Judgment: Judgment normal.    BP 139/69   Pulse 97   Temp 98.5 F (36.9 C) (Temporal)   Resp 20   Ht 5\' 6"  (1.676 m)   Wt 173 lb (78.5 kg)   SpO2 98%   BMI 27.92 kg/m         Assessment & Plan:  Leafy Kindle Rymer in today with chief complaint of Discuss short term disability and Dysuria   1. Dysuria - Urinalysis, Complete  2. Acute cystitis with hematuria Force fluid Cranberry juice RTO prn - sulfamethoxazole-trimethoprim (BACTRIM DS) 800-160 MG tablet; Take 1 tablet by mouth 2 (two) times daily.  Dispense: 20 tablet; Refill: 0  3. 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 The  patient verbalized understanding of and has agreed to the management plan. Patient is aware to call the clinic if symptoms persist or worsen. Patient is aware when to return to the clinic for a follow-up visit. Patient educated on when it is appropriate to go to the emergency department.   Mary-Margaret Hassell Done, FNP

## 2019-12-10 NOTE — Therapy (Signed)
[facility] [place], [place], [address] Phone: [phone]   Fax:  [phone]  Physical Therapy Treatment  Patient Details  Name: Brandon Elliott MRN: [id number] Date of Birth: 1956/02/08 Referring Provider (PT): Ronnald Collum   Encounter Date: 12/10/2019  PT End of Session - 12/10/19 0957    Visit Number  9    Number of Visits  12    Date for PT Re-Evaluation  11/19/19    Authorization Type  FOTO 6th visit: 42% limitation; progress note every 10th visit    PT Start Time  0947    PT Stop Time  1030    PT Time Calculation (min)  43 min    Activity Tolerance  Patient tolerated treatment well    Behavior During Therapy  [facility] for tasks assessed/performed       Past Medical History:  Diagnosis Date  . Bronchitis    DX 10-03-2012--  ON ANTIBIOTICS  . DM (diabetes mellitus), type 2 ([place])   . History of BPH    W/ BOO  . History of gastric ulcer 2010  . Hyperlipidemia   . Hypertension   . Prostate cancer ([place]) 06/29/12   Adenocarcinoma,gleason=3+4=7,& 3+3=6,PSA=4.23,volume=38cc    Past Surgical History:  Procedure Laterality Date  . RADIOACTIVE SEED IMPLANT N/A 10/16/2012   Procedure: RADIOACTIVE SEED IMPLANT;  Surgeon: Bernestine Amass, MD;  Location: [facility];  Service: Urology;  Laterality: N/A;  69 seeds implanted no seeds found in bladder     There were no vitals filed for this visit.  Subjective Assessment - 12/10/19 0956    Subjective  COVID-19 screen performed prior to patient entering clinic.  Reports that his back is better today.    Pertinent History  DM, HTN, prostate CA.    How long can you stand comfortably?  ~10 minutes.    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    Currently in Pain?  Yes    Pain Score  3     Pain Location  Back    Pain Orientation  Lower    Pain Descriptors / Indicators  Discomfort    Pain Type  Acute pain    Pain  Onset  More than a month ago    Pain Frequency  Intermittent         [facility] PT Assessment - 12/10/19 0001      Assessment   Medical Diagnosis  Acute midline low back pain without sciatica.    Referring Provider (PT)  Ronnald Collum      Precautions   Precautions  None                    [facility] Adult PT Treatment/Exercise - 12/10/19 0001      Lumbar Exercises: Stretches   Single Knee to Chest Stretch  Right;Left;2 reps;30 seconds      Lumbar Exercises: Aerobic   Nustep  L4 x15 min      Lumbar Exercises: Machines for Strengthening   Cybex Lumbar Extension  60# 2x10 reps    Other Lumbar Machine Exercise  Ab machine with 60# 2x10 reps      Lumbar Exercises: Standing   Forward Lunge  15 reps;2 seconds   BLE lunge with 4# reachouts     Lumbar Exercises: Supine   Bridge  20 reps;3 seconds      Modalities   Modalities  Electrical  Stimulation      Acupuncturist Location  B low back    Electrical Stimulation Action  Pre-Mod    Electrical Stimulation Parameters  80-150 hz x15 min    Electrical Stimulation Goals  Pain                  PT Long Term Goals - 11/14/19 1011      PT LONG TERM GOAL #1   Title  Independent with a HEP.    Time  6    Period  Weeks    Status  On-going      PT LONG TERM GOAL #2   Title  Stand 20 minutes with pain not > 3/10.    Status  On-going      PT LONG TERM GOAL #3   Title  Perform ADL's with pain not > 3/10.    Status  On-going      PT LONG TERM GOAL #4   Title  Eliminate right LE symptoms.    Status  On-going            Plan - 12/10/19 1026    Clinical Impression Statement  Patient presented in clinic with reports of less LBP and thinks he is ready for return to work as expected on 12/22/2019. Patient progressed through more strengthening exercises with postures cues throughout. Only reports of hip stretching noted with briding and SKTC by patient. Normal stimulation response  noted following removal of the modality.    Personal Factors and Comorbidities  Comorbidity 2    Comorbidities  DM, HTN, prostate CA.    Examination-Activity Limitations  Stand    Stability/Clinical Decision Making  Evolving/Moderate complexity    Rehab Potential  Excellent    PT Frequency  2x / week    PT Duration  6 weeks    PT Treatment/Interventions  ADLs/Self Care Home Management;Cryotherapy;Electrical Stimulation;Ultrasound;Traction;Moist Heat;Functional mobility training;Therapeutic activities;Therapeutic exercise;Manual techniques;Patient/family education;Passive range of motion;Dry needling;Spinal Manipulations    PT Next Visit Plan  Continue with core exercise progression. Modalities PRN for pain relief    PT Home Exercise Plan  Access Code: [email or web address]    Consulted and Agree with Plan of Care  Patient       Patient will benefit from skilled therapeutic intervention in order to improve the following deficits and impairments:     Visit Diagnosis: Acute right-sided low back pain with right-sided sciatica     Problem List Patient Active Problem List   Diagnosis Date Noted  . BMI 32.0-32.9,adult 08/28/2015  . Type 2 diabetes mellitus without complication ([place]) [id number]  . Hypertension 12/13/2012  . Hyperlipidemia 12/13/2012  . Prostate cancer [facility]) 06/29/2012    Standley Brooking, PTA 12/10/2019, 10:40 AM  [facility] [address] [place], [place], [address] Phone: [phone]   Fax:  [phone]  Name: Brandon Elliott MRN: [id number] Date of Birth: [birth year]

## 2019-12-12 ENCOUNTER — Ambulatory Visit: Payer: Managed Care, Other (non HMO) | Admitting: Physical Therapy

## 2019-12-12 ENCOUNTER — Other Ambulatory Visit: Payer: Self-pay

## 2019-12-12 ENCOUNTER — Encounter: Payer: Self-pay | Admitting: Physical Therapy

## 2019-12-12 DIAGNOSIS — M5441 Lumbago with sciatica, right side: Secondary | ICD-10-CM

## 2019-12-12 NOTE — Therapy (Signed)
[facility] [place], [place], [address] Phone: [phone]   Fax:  [phone]  Physical Therapy Treatment  Patient Details  Name: Brandon Elliott MRN: [id number] Date of Birth: [birth year] Referring Provider (PT): Ronnald Collum   Encounter Date: 12/12/2019  PT End of Session - 12/12/19 1021    Visit Number  10    Number of Visits  12    Date for PT Re-Evaluation  12/31/19    Authorization Type  FOTO 6th visit: 42% limitation; progress note every 10th visit    PT Start Time  0947    PT Stop Time  1035    PT Time Calculation (min)  48 min    Activity Tolerance  Patient tolerated treatment well    Behavior During Therapy  [facility] for tasks assessed/performed       Past Medical History:  Diagnosis Date  . Bronchitis    DX 10-03-2012--  ON ANTIBIOTICS  . DM (diabetes mellitus), type 2 ([place])   . History of BPH    W/ BOO  . History of gastric ulcer 2010  . Hyperlipidemia   . Hypertension   . Prostate cancer ([place]) 06/29/12   Adenocarcinoma,gleason=3+4=7,& 3+3=6,PSA=4.23,volume=38cc    Past Surgical History:  Procedure Laterality Date  . RADIOACTIVE SEED IMPLANT N/A 10/16/2012   Procedure: RADIOACTIVE SEED IMPLANT;  Surgeon: Bernestine Amass, MD;  Location: [facility];  Service: Urology;  Laterality: N/A;  69 seeds implanted no seeds found in bladder     There were no vitals filed for this visit.  Subjective Assessment - 12/12/19 1022    Subjective  COVID-19 screen performed prior to patient entering clinic.  Doing much better.    Pertinent History  DM, HTN, prostate CA.    How long can you stand comfortably?  ~10 minutes.    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    Currently in Pain?  Yes    Pain Score  2     Pain Location  Back    Pain Orientation  Lower    Pain Descriptors / Indicators  Discomfort    Pain Onset  More than a month ago                         [facility] Adult PT Treatment/Exercise - 12/12/19 0001      Exercises   Exercises  Knee/Hip      Lumbar Exercises: Aerobic   Nustep  Level 5 x 15 minutes.      Lumbar Exercises: Machines for Strengthening   Cybex Lumbar Extension  60# x 4 minutes.    Other Lumbar Machine Exercise  Ab machine at 60# x 4 minutes.      Modalities   Modalities  Electrical Stimulation;Moist Heat      Moist Heat Therapy   Number Minutes Moist Heat  20 Minutes    Moist Heat Location  Lumbar Spine      Electrical Stimulation   Electrical Stimulation Location  Bil low back.    Electrical Stimulation Action  IFC    Electrical Stimulation Parameters  80-150 Hz x 20 minutes.    Electrical Stimulation Goals  Pain                  PT Long Term Goals - 12/12/19 1110      PT LONG TERM GOAL #1  Title  Independent with a HEP.    Time  6    Period  Weeks    Status  Achieved      PT LONG TERM GOAL #2   Title  Stand 20 minutes with pain not > 3/10.    Time  6    Period  Weeks    Status  Achieved      PT LONG TERM GOAL #3   Title  Perform ADL's with pain not > 3/10.    Time  6    Period  Weeks    Status  Achieved      PT LONG TERM GOAL #4   Title  Eliminate right LE symptoms.    Time  6    Period  Weeks    Status  Achieved            Plan - 12/12/19 1109    Clinical Impression Statement  See discharge summary.    Personal Factors and Comorbidities  Comorbidity 2    Comorbidities  DM, HTN, prostate CA.    Examination-Activity Limitations  Stand    Stability/Clinical Decision Making  Evolving/Moderate complexity    Rehab Potential  Excellent    PT Frequency  2x / week    PT Duration  6 weeks    PT Treatment/Interventions  ADLs/Self Care Home Management;Cryotherapy;Electrical Stimulation;Ultrasound;Traction;Moist Heat;Functional mobility training;Therapeutic activities;Therapeutic exercise;Manual techniques;Patient/family education;Passive  range of motion;Dry needling;Spinal Manipulations    PT Next Visit Plan  Continue with core exercise progression. Modalities PRN for pain relief    PT Home Exercise Plan  Access Code: [email or web address]    Consulted and Agree with Plan of Care  Patient       Patient will benefit from skilled therapeutic intervention in order to improve the following deficits and impairments:     Visit Diagnosis: Acute right-sided low back pain with right-sided sciatica     Problem List Patient Active Problem List   Diagnosis Date Noted  . BMI 32.0-32.9,adult 08/28/2015  . Type 2 diabetes mellitus without complication ([place]) 39/09/90  . Hypertension 12/13/2012  . Hyperlipidemia 12/13/2012  . Prostate cancer ([place]) 06/29/2012   PHYSICAL THERAPY DISCHARGE SUMMARY  Visits from Start of Care: 10.  Current functional level related to goals / functional outcomes: See above.   Remaining deficits: All goals met.   Education / Equipment: HEP. Plan: Patient agrees to discharge.  Patient goals were met. Patient is being discharged due to meeting the stated rehab goals.  ?????      Renie Stelmach, [place] MPT 12/12/2019, 11:11 AM  [facility] [address] [place], [place], [address] Phone: [phone]   Fax:  [phone]  Name: Brandon Elliott MRN: [id number] Date of Birth: February 01, 1956

## 2020-01-14 ENCOUNTER — Encounter: Payer: Self-pay | Admitting: Nurse Practitioner

## 2020-01-14 ENCOUNTER — Ambulatory Visit (INDEPENDENT_AMBULATORY_CARE_PROVIDER_SITE_OTHER): Payer: Managed Care, Other (non HMO) | Admitting: Nurse Practitioner

## 2020-01-14 ENCOUNTER — Other Ambulatory Visit: Payer: Self-pay

## 2020-01-14 VITALS — BP 131/77 | HR 79 | Temp 97.9°F | Resp 20 | Ht 66.0 in | Wt 178.0 lb

## 2020-01-14 DIAGNOSIS — K115 Sialolithiasis: Secondary | ICD-10-CM | POA: Diagnosis not present

## 2020-01-14 NOTE — Patient Instructions (Signed)
Salivary Stone  A salivary stone is a mineral deposit that builds up in the ducts that drain your salivary glands. Most salivary stones are made of calcium. When a stone forms, saliva can back up into the gland and cause painful swelling. Your salivary glands are the glands that produce saliva. You have six major salivary glands. Each gland has a duct that carries saliva into your mouth. Saliva keeps your mouth moist and breaks down the food that you eat. It also helps prevent tooth decay. Two salivary glands are located just in front of your ears (parotid). The ducts for these glands open up inside your cheeks, near your back teeth. You also have two glands under your tongue (sublingual) and two glands under your jaw (submandibular). The ducts for these glands open under your tongue. A stone can form in any salivary gland. The most common place for a salivary stone to develop is in a submandibular salivary gland. What are the causes? Salivary stones may be caused by any condition that reduces the flow of saliva. It is not known why some people form stones. What increases the risk? You are more likely to develop this condition if:  You are male.  You do not drink enough water.  You smoke.  You have any of the following: ? High blood pressure. ? Gout. ? Diabetes. What are the signs or symptoms? The main sign of a salivary gland stone is sudden swelling of a salivary gland when eating. This usually happens under the jaw on one side. Other signs and symptoms may include:  Swelling of the cheek or under the tongue when eating.  Pain in the swollen area.  Trouble chewing or swallowing.  Swelling that goes down after eating. How is this diagnosed? This condition may be diagnosed based on:  Your signs and symptoms.  A physical exam. In many cases, your health care provider will be able to feel the stone in a duct inside your mouth.  Imaging studies, such  as: ? X-rays. ? Ultrasound. ? CT scan. ? MRI. You may need to see an ear, nose, and throat specialist (ENT or otolaryngologist) for diagnosis and treatment. How is this treated? Treatment for this condition depends on the size of the stone.  A small stone that is not causing symptoms may be treated with home care.  For a stone that is large enough to cause symptoms, the treatment options may include: ? Probing and widening of the duct to allow the stone to pass. ? Inserting a thin, flexible scope (endoscope) into the duct to locate and remove the stone. ? Breaking up the stone with sound waves. ? Removing the entire salivary gland. Follow these instructions at home:  To relieve discomfort  Follow these instructions every few hours: ? Suck on a lemon candy to stimulate the flow of saliva. ? Put a warm compress over the gland. ? Gently massage the gland. General instructions  Drink enough fluid to keep your urine pale yellow.  Do not use any products that contain nicotine or tobacco, such as cigarettes and e-cigarettes. If you need help quitting, ask your health care provider.  Keep all follow-up visits as told by your health care provider. This is important. Contact a health care provider if:  You have pain and swelling in your face, jaw, or mouth after eating.  You have persistent swelling in any of these places: ? In front of your ear. ? Under your jaw. ? Inside your mouth. Get   help right away if:  You have pain and swelling in your face, jaw, or mouth, and this is getting worse.  Your pain and swelling make it hard to swallow or breathe. Summary  A salivary stone is a mineral deposit that builds up in the ducts that drain your salivary glands.  When a stone forms, saliva can back up into the gland and cause painful swelling.  Salivary stones may be caused by any condition that reduces the flow of saliva.  Treatment for this condition depends on the size of the  stone. This information is not intended to replace advice given to you by your health care provider. Make sure you discuss any questions you have with your health care provider. Document Revised: 08/15/2017 Document Reviewed: 08/15/2017 Elsevier Patient Education  2020 [employer]  

## 2020-01-14 NOTE — Progress Notes (Signed)
   Subjective:    Patient ID: Brandon Elliott, male    DOB: 08-Oct-1955, 64 y.o.   MRN: [id number]   Chief Complaint: Neck Pain (Left side)   HPI About 1 week ago he noticed that his left jaw started selling. He said it continued to swell. Then he noticed a white place under his tongue and he squeezed it and white bad tasting stuff came out hen it resolved.   Review of Systems  Constitutional: Negative for diaphoresis.  Eyes: Negative for pain.  Respiratory: Negative for shortness of breath.   Cardiovascular: Negative for chest pain, palpitations and leg swelling.  Gastrointestinal: Negative for abdominal pain.  Endocrine: Negative for polydipsia.  Skin: Negative for rash.  Neurological: Negative for dizziness, weakness and headaches.  Hematological: Does not bruise/bleed easily.  All other systems reviewed and are negative.      Objective:   Physical Exam Vitals and nursing note reviewed.  Constitutional:      Appearance: Normal appearance. He is well-developed.  HENT:     Head: Normocephalic.     Comments: No facial edema    Nose: Nose normal.  Eyes:     Pupils: Pupils are equal, round, and reactive to light.  Neck:     Thyroid: No thyroid mass or thyromegaly.     Vascular: No carotid bruit or JVD.     Trachea: Phonation normal.  Cardiovascular:     Rate and Rhythm: Normal rate and regular rhythm.  Pulmonary:     Effort: Pulmonary effort is normal. No respiratory distress.     Breath sounds: Normal breath sounds.  Abdominal:     General: Bowel sounds are normal.     Palpations: Abdomen is soft.     Tenderness: There is no abdominal tenderness.  Musculoskeletal:        General: Normal range of motion.     Cervical back: Normal range of motion and neck supple.  Lymphadenopathy:     Cervical: No cervical adenopathy.  Skin:    General: Skin is warm and dry.  Neurological:     Mental Status: He is alert and oriented to person, place, and time.  Psychiatric:         Behavior: Behavior normal.        Thought Content: Thought content normal.        Judgment: Judgment normal.    BP 131/77   Pulse 79   Temp 97.9 F (36.6 C) (Temporal)   Resp 20   Ht 5\' 6"  (1.676 m)   Wt 178 lb (80.7 kg)   SpO2 97%   BMI 28.73 kg/m         Assessment & Plan:  Brandon Elliott in today with chief complaint of Neck Pain (Left side)   1. Salivary gland calculi probable diagnosis RTO if reoccurs     The above assessment and management plan was discussed with the patient. The patient verbalized understanding of and has agreed to the management plan. Patient is aware to call the clinic if symptoms persist or worsen. Patient is aware when to return to the clinic for a follow-up visit. Patient educated on when it is appropriate to go to the emergency department.   Mary-Margaret Hassell Done, FNP

## 2020-01-22 ENCOUNTER — Ambulatory Visit: Payer: Self-pay | Admitting: Nurse Practitioner

## 2020-01-24 ENCOUNTER — Encounter: Payer: Self-pay | Admitting: Nurse Practitioner

## 2020-06-18 ENCOUNTER — Other Ambulatory Visit: Payer: Self-pay | Admitting: Nurse Practitioner

## 2020-06-18 DIAGNOSIS — C61 Malignant neoplasm of prostate: Secondary | ICD-10-CM

## 2020-06-23 ENCOUNTER — Other Ambulatory Visit: Payer: Managed Care, Other (non HMO)

## 2020-06-23 DIAGNOSIS — Z20822 Contact with and (suspected) exposure to covid-19: Secondary | ICD-10-CM

## 2020-06-25 LAB — NOVEL CORONAVIRUS, NAA: SARS-CoV-2, NAA: NOT DETECTED

## 2020-06-25 LAB — SARS-COV-2, NAA 2 DAY TAT

## 2020-06-27 ENCOUNTER — Telehealth: Payer: Managed Care, Other (non HMO) | Admitting: Nurse Practitioner

## 2020-06-27 NOTE — Telephone Encounter (Signed)
Pt aware covid lab test negative, covid not detected

## 2020-07-16 ENCOUNTER — Other Ambulatory Visit: Payer: Self-pay | Admitting: Nurse Practitioner

## 2020-07-16 DIAGNOSIS — I10 Essential (primary) hypertension: Secondary | ICD-10-CM

## 2020-07-16 DIAGNOSIS — C61 Malignant neoplasm of prostate: Secondary | ICD-10-CM

## 2020-07-20 ENCOUNTER — Other Ambulatory Visit: Payer: Self-pay | Admitting: Nurse Practitioner

## 2020-07-20 DIAGNOSIS — E785 Hyperlipidemia, unspecified: Secondary | ICD-10-CM

## 2020-07-20 DIAGNOSIS — I10 Essential (primary) hypertension: Secondary | ICD-10-CM

## 2020-07-21 ENCOUNTER — Other Ambulatory Visit: Payer: Self-pay | Admitting: Nurse Practitioner

## 2020-07-21 ENCOUNTER — Telehealth: Payer: Self-pay | Admitting: Nurse Practitioner

## 2020-07-21 DIAGNOSIS — C61 Malignant neoplasm of prostate: Secondary | ICD-10-CM

## 2020-07-21 NOTE — Telephone Encounter (Signed)
  Prescription Request  07/21/2020  What is the name of the medication or equipment? Tamsulosin   Have you contacted your pharmacy to request a refill? (if applicable) yes  Which pharmacy would you like this sent to? [facility] in [place]   Patient notified that their request is being sent to the clinical staff for review and that they should receive a response within 2 business days.

## 2020-07-22 ENCOUNTER — Other Ambulatory Visit: Payer: Self-pay | Admitting: Nurse Practitioner

## 2020-07-22 ENCOUNTER — Telehealth: Payer: Self-pay

## 2020-07-22 DIAGNOSIS — C61 Malignant neoplasm of prostate: Secondary | ICD-10-CM

## 2020-07-22 NOTE — Telephone Encounter (Signed)
Please see previous telephone encounter regarding this. Will close this encounter.

## 2020-07-22 NOTE — Telephone Encounter (Signed)
  Prescription Request  07/22/2020  What is the name of the medication or equipment? tamsulosin (FLOMAX) 0.4 MG CAPS capsule  Have you contacted your pharmacy to request a refill? (if applicable) yes  Which pharmacy would you like this sent to? [facility] [place]   Patient notified that their request is being sent to the clinical staff for review and that they should receive a response within 2 business days.

## 2020-07-25 NOTE — Telephone Encounter (Signed)
Attempted to contact pt without return call in over 3 days, will close encounter. 

## 2020-08-01 ENCOUNTER — Other Ambulatory Visit: Payer: Self-pay | Admitting: Nurse Practitioner

## 2020-08-01 DIAGNOSIS — E785 Hyperlipidemia, unspecified: Secondary | ICD-10-CM

## 2020-08-01 DIAGNOSIS — E119 Type 2 diabetes mellitus without complications: Secondary | ICD-10-CM

## 2020-08-13 ENCOUNTER — Other Ambulatory Visit: Payer: Self-pay

## 2020-08-13 ENCOUNTER — Encounter: Payer: Self-pay | Admitting: Nurse Practitioner

## 2020-08-13 ENCOUNTER — Ambulatory Visit (INDEPENDENT_AMBULATORY_CARE_PROVIDER_SITE_OTHER): Payer: Managed Care, Other (non HMO) | Admitting: Nurse Practitioner

## 2020-08-13 VITALS — BP 126/79 | HR 86 | Temp 98.1°F | Resp 20 | Ht 66.0 in | Wt 181.0 lb

## 2020-08-13 DIAGNOSIS — E119 Type 2 diabetes mellitus without complications: Secondary | ICD-10-CM

## 2020-08-13 DIAGNOSIS — C61 Malignant neoplasm of prostate: Secondary | ICD-10-CM

## 2020-08-13 DIAGNOSIS — I10 Essential (primary) hypertension: Secondary | ICD-10-CM | POA: Diagnosis not present

## 2020-08-13 DIAGNOSIS — Z23 Encounter for immunization: Secondary | ICD-10-CM | POA: Diagnosis not present

## 2020-08-13 DIAGNOSIS — E785 Hyperlipidemia, unspecified: Secondary | ICD-10-CM

## 2020-08-13 DIAGNOSIS — Z6832 Body mass index (BMI) 32.0-32.9, adult: Secondary | ICD-10-CM

## 2020-08-13 LAB — BAYER DCA HB A1C WAIVED: HB A1C (BAYER DCA - WAIVED): 6.6 % (ref <7.0)

## 2020-08-13 MED ORDER — TAMSULOSIN HCL 0.4 MG PO CAPS: 0.4000 mg | ORAL_CAPSULE | Freq: Every day | ORAL | 1 refills | Status: DC

## 2020-08-13 MED ORDER — FENOFIBRATE 145 MG PO TABS: ORAL_TABLET | ORAL | 1 refills | Status: DC

## 2020-08-13 MED ORDER — AMLODIPINE BESYLATE 5 MG PO TABS: ORAL_TABLET | ORAL | 1 refills | Status: DC

## 2020-08-13 MED ORDER — JANUMET 50-1000 MG PO TABS
ORAL_TABLET | ORAL | 1 refills | Status: DC
Start: 2020-08-13 — End: 2020-11-24

## 2020-08-13 MED ORDER — ATORVASTATIN CALCIUM 40 MG PO TABS: 40.0000 mg | ORAL_TABLET | Freq: Every day | ORAL | 1 refills | Status: DC

## 2020-08-13 MED ORDER — LISINOPRIL-HYDROCHLOROTHIAZIDE 20-25 MG PO TABS: 2.0000 | ORAL_TABLET | Freq: Every day | ORAL | 1 refills | Status: DC

## 2020-08-13 NOTE — Patient Instructions (Signed)

## 2020-08-13 NOTE — Addendum Note (Signed)
Addended by: Rolena Infante on: 08/13/2020 03:30 PM   Modules accepted: Orders

## 2020-08-13 NOTE — Progress Notes (Signed)
Subjective:    Patient ID: Brandon Elliott, male    DOB: [birth year], 65 y.o.   MRN: [id number]   Chief Complaint: Medical Management of Chronic Issues    HPI:  1. Primary hypertension No c/o chest pain, sob or headache. Does not check blood pressure at home. BP Readings from Last 3 Encounters:  08/13/20 126/79  01/14/20 131/77  12/10/19 139/69     2. Hyperlipidemia, unspecified hyperlipidemia type Does not watch diet and does no dedicated exercises. Lab Results  Component Value Date   CHOL 112 10/22/2019   HDL 41 10/22/2019   LDLCALC 35 10/22/2019   TRIG 230 (H) 10/22/2019   CHOLHDL 2.7 10/22/2019    3. Type 2 diabetes mellitus without complication, without long-term current use of insulin ([place]) He does not check his blood sugars at home and does not watch his diet at all. Lab Results  Component Value Date   HGBA1C 7.1 (H) 10/22/2019      4. Prostate cancer ([place]) No problems voiding. Has not seen urology lately. Lab Results  Component Value Date   PSA1 <0.1 10/22/2019      5. BMI 32.0-32.9,adult No recent weight changes  Wt Readings from Last 3 Encounters:  08/13/20 181 lb (82.1 kg)  01/14/20 178 lb (80.7 kg)  12/10/19 173 lb (78.5 kg)   BMI Readings from Last 3 Encounters:  08/13/20 29.21 kg/m  01/14/20 28.73 kg/m  12/10/19 27.92 kg/m       Outpatient Encounter Medications as of 08/13/2020  Medication Sig  . amLODipine (NORVASC) 5 MG tablet TAKE 1 TABLET BY MOUTH ONCE DAILY (Needs to be seen before next refill)  . atorvastatin (LIPITOR) 40 MG tablet TAKE 1 TABLET BY MOUTH ONCE DAILY IN THE MORNING  . cyclobenzaprine (FLEXERIL) 10 MG tablet Take 1 tablet (10 mg total) by mouth 3 (three) times daily as needed for muscle spasms.  . fenofibrate (TRICOR) 145 MG tablet TAKE 1 TABLET BY MOUTH ONCE DAILY (Needs to be seen before next refill)  . JANUMET 50-1000 MG tablet TAKE 1 TABLET BY MOUTH TWICE DAILY WITH A MEAL  . lisinopril-hydrochlorothiazide  (ZESTORETIC) 20-25 MG tablet Take 2 tablets by mouth daily. (Needs to be seen before next refill)  . naproxen (NAPROSYN) 500 MG tablet Take 1 tablet (500 mg total) by mouth 2 (two) times daily with a meal.  . tamsulosin (FLOMAX) 0.4 MG CAPS capsule Take 1 capsule (0.4 mg total) by mouth daily.   No facility-administered encounter medications on file as of 08/13/2020.    Past Surgical History:  Procedure Laterality Date  . RADIOACTIVE SEED IMPLANT N/A 10/16/2012   Procedure: RADIOACTIVE SEED IMPLANT;  Surgeon: Bernestine Amass, MD;  [facility];  Service: Urology;  Laterality: N/A;  74 seeds implanted no seeds found in bladder     Family History  Problem Relation Age of Onset  . Stroke Mother   . Cancer Father        prostate    New complaints: None today  Social history: Lives with his wife  Controlled substance contract: n/a    Review of Systems  Constitutional: Negative for diaphoresis.  Eyes: Negative for pain.  Respiratory: Negative for shortness of breath.   Cardiovascular: Negative for chest pain, palpitations and leg swelling.  Gastrointestinal: Negative for abdominal pain.  Endocrine: Negative for polydipsia.  Skin: Negative for rash.  Neurological: Negative for dizziness, weakness and headaches.  Hematological: Does not bruise/bleed easily.  All other systems  reviewed and are negative.      Objective:   Physical Exam Vitals and nursing note reviewed.  Constitutional:      Appearance: Normal appearance. He is well-developed and well-nourished.  HENT:     Head: Normocephalic.     Nose: Nose normal.     Mouth/Throat:     Mouth: Oropharynx is clear and moist.  Eyes:     Extraocular Movements: EOM normal.     Pupils: Pupils are equal, round, and reactive to light.  Neck:     Thyroid: No thyroid mass or thyromegaly.     Vascular: No carotid bruit or JVD.     Trachea: Phonation normal.  Cardiovascular:     Rate and Rhythm: Normal  rate and regular rhythm.  Pulmonary:     Effort: Pulmonary effort is normal. No respiratory distress.     Breath sounds: Normal breath sounds.  Abdominal:     General: Bowel sounds are normal. Aorta is normal.     Palpations: Abdomen is soft.     Tenderness: There is no abdominal tenderness.  Musculoskeletal:        General: Normal range of motion.     Cervical back: Normal range of motion and neck supple.  Lymphadenopathy:     Cervical: No cervical adenopathy.  Skin:    General: Skin is warm and dry.  Neurological:     Mental Status: He is alert and oriented to person, place, and time.  Psychiatric:        Mood and Affect: Mood and affect normal.        Behavior: Behavior normal.        Thought Content: Thought content normal.        Judgment: Judgment normal.    BP 126/79   Pulse 86   Temp 98.1 F (36.7 C) (Temporal)   Resp 20   Ht 5' 6"  (1.676 m)   Wt 181 lb (82.1 kg)   SpO2 96%   BMI 29.21 kg/m   hgba1c 6.6%       Assessment & Plan:  [name] comes in today with chief complaint of Medical Management of Chronic Issues   Diagnosis and orders addressed:  1.Primary hypertension Low sodium diet - CBC with Differential/Platelet - CMP14+EGFR - CBC with Differential/Platelet - CMP14+EGFR - lisinopril-hydrochlorothiazide (ZESTORETIC) 20-25 MG tablet; Take 2 tablets by mouth daily. (Needs to be seen before next refill)  Dispense: 180 tablet; Refill: 1 - amLODipine (NORVASC) 5 MG tablet; TAKE 1 TABLET BY MOUTH ONCE DAILY (Needs to be seen before next refill)  Dispense: 90 tablet; Refill: 1  2. Hyperlipidemia, unspecified hyperlipidemia type Low fat diet - Lipid panel - Lipid panel - atorvastatin (LIPITOR) 40 MG tablet; Take 1 tablet (40 mg total) by mouth daily.  Dispense: 90 tablet; Refill: 1 - fenofibrate (TRICOR) 145 MG tablet; TAKE 1 TABLET BY MOUTH ONCE DAILY (Needs to be seen before next refill)  Dispense: 90 tablet; Refill: 1  3. Type 2 diabetes  mellitus without complication, without long-term current use of insulin (HCC) Continue to watch carbs in diet - Bayer DCA Hb A1c Waived - Microalbumin / creatinine urine ratio - Bayer DCA Hb A1c Waived - Microalbumin / creatinine urine ratio - sitaGLIPtin-metformin (JANUMET) 50-1000 MG tablet; TAKE 1 TABLET BY MOUTH TWICE DAILY WITH A MEAL  Dispense: 180 tablet; Refill: 1   4. Prostate cancer ([place]) - tamsulosin (FLOMAX) 0.4 MG CAPS capsule; Take 1 capsule (0.4 mg total) by mouth daily.  Dispense: 90 capsule; Refill: 1  5. BMI 32.0-32.9,adult Discussed diet and exercise for person with BMI >25 Will recheck weight in 3-6 months    Labs pending Health Maintenance reviewed Diet and exercise encouraged  Follow up plan: 3 months   Mary-Margaret Hassell Done, FNP

## 2020-08-14 LAB — CMP14+EGFR
ALT: 27 IU/L (ref 0–44)
AST: 25 IU/L (ref 0–40)
Albumin/Globulin Ratio: 1.7 (ref 1.2–2.2)
Albumin: 4 g/dL (ref 3.8–4.8)
Alkaline Phosphatase: 54 IU/L (ref 44–121)
BUN/Creatinine Ratio: 12 (ref 10–24)
BUN: 15 mg/dL (ref 8–27)
Bilirubin Total: 0.3 mg/dL (ref 0.0–1.2)
CO2: 25 mmol/L (ref 20–29)
Calcium: 9.3 mg/dL (ref 8.6–10.2)
Chloride: 106 mmol/L (ref 96–106)
Creatinine, Ser: 1.21 mg/dL (ref 0.76–1.27)
GFR calc Af Amer: 72 mL/min/{1.73_m2} (ref >59)
GFR calc non Af Amer: 62 mL/min/{1.73_m2} (ref >59)
Globulin, Total: 2.3 g/dL (ref 1.5–4.5)
Glucose: 198 mg/dL — ABNORMAL HIGH (ref 65–99)
Potassium: 4.1 mmol/L (ref 3.5–5.2)
Sodium: 144 mmol/L (ref 134–144)
Total Protein: 6.3 g/dL (ref 6.0–8.5)

## 2020-08-14 LAB — CBC WITH DIFFERENTIAL/PLATELET
Basophils Absolute: 0 10*3/uL (ref 0.0–0.2)
Basos: 1 %
EOS (ABSOLUTE): 0.1 10*3/uL (ref 0.0–0.4)
Eos: 1 %
Hematocrit: 40.1 % (ref 37.5–51.0)
Hemoglobin: 13.8 g/dL (ref 13.0–17.7)
Immature Grans (Abs): 0 10*3/uL (ref 0.0–0.1)
Immature Granulocytes: 1 %
Lymphocytes Absolute: 2.2 10*3/uL (ref 0.7–3.1)
Lymphs: 43 %
MCH: 31.6 pg (ref 26.6–33.0)
MCHC: 34.4 g/dL (ref 31.5–35.7)
MCV: 92 fL (ref 79–97)
Monocytes Absolute: 0.6 10*3/uL (ref 0.1–0.9)
Monocytes: 11 %
Neutrophils Absolute: 2.2 10*3/uL (ref 1.4–7.0)
Neutrophils: 43 %
Platelets: 248 10*3/uL (ref 150–450)
RBC: 4.37 x10E6/uL (ref 4.14–5.80)
RDW: 12.2 % (ref 11.6–15.4)
WBC: 5.1 10*3/uL (ref 3.4–10.8)

## 2020-08-14 LAB — LIPID PANEL
Chol/HDL Ratio: 2.7 ratio (ref 0.0–5.0)
Cholesterol, Total: 102 mg/dL (ref 100–199)
HDL: 38 mg/dL — ABNORMAL LOW (ref >39)
LDL Chol Calc (NIH): 44 mg/dL (ref 0–99)
Triglycerides: 106 mg/dL (ref 0–149)
VLDL Cholesterol Cal: 20 mg/dL (ref 5–40)

## 2020-10-03 ENCOUNTER — Encounter: Payer: Self-pay | Admitting: Family Medicine

## 2020-10-03 ENCOUNTER — Ambulatory Visit (INDEPENDENT_AMBULATORY_CARE_PROVIDER_SITE_OTHER): Payer: Managed Care, Other (non HMO) | Admitting: Family Medicine

## 2020-10-03 DIAGNOSIS — J069 Acute upper respiratory infection, unspecified: Secondary | ICD-10-CM | POA: Diagnosis not present

## 2020-10-03 DIAGNOSIS — R059 Cough, unspecified: Secondary | ICD-10-CM

## 2020-10-03 LAB — VERITOR FLU A/B WAIVED
Influenza A: NEGATIVE
Influenza B: NEGATIVE

## 2020-10-03 NOTE — Progress Notes (Signed)
Virtual Visit via Telephone Note  I connected with Brandon Elliott on 10/03/20 at 9:52 AM by telephone and verified that I am speaking with the correct person using two identifiers. Brandon Elliott is currently located at home and his fiance is currently with him during this visit. The provider, Loman Brooklyn, FNP is located in their home at time of visit.  I discussed the limitations, risks, security and privacy concerns of performing an evaluation and management service by telephone and the availability of in person appointments. I also discussed with the patient that there may be a patient responsible charge related to this service. The patient expressed understanding and agreed to proceed.  Subjective: PCP: Chevis Pretty, FNP  Chief Complaint  Patient presents with  . Back Pain  . Headache  . Emesis   Patient complains of cough, head/chest congestion, headache, runny nose, nausea, vomiting (throwing up "the cold") and body aches. Afebrile - temp this morning 97.x. Onset of symptoms was 1 day ago, gradually worsening since that time. He is drinking moderate amounts of fluids. Evaluation to date: none. Treatment to date: Nyquil & Mucinex. He does smoke. Patient has been fully vaccinated against COVID-19 but has not yet had his booster.    ROS: Per HPI  Current Outpatient Medications:  .  amLODipine (NORVASC) 5 MG tablet, TAKE 1 TABLET BY MOUTH ONCE DAILY (Needs to be seen before next refill), Disp: 90 tablet, Rfl: 1 .  atorvastatin (LIPITOR) 40 MG tablet, Take 1 tablet (40 mg total) by mouth daily., Disp: 90 tablet, Rfl: 1 .  cyclobenzaprine (FLEXERIL) 10 MG tablet, Take 1 tablet (10 mg total) by mouth 3 (three) times daily as needed for muscle spasms., Disp: 30 tablet, Rfl: 1 .  fenofibrate (TRICOR) 145 MG tablet, TAKE 1 TABLET BY MOUTH ONCE DAILY (Needs to be seen before next refill), Disp: 90 tablet, Rfl: 1 .  lisinopril-hydrochlorothiazide (ZESTORETIC) 20-25 MG tablet, Take 2  tablets by mouth daily. (Needs to be seen before next refill), Disp: 180 tablet, Rfl: 1 .  naproxen (NAPROSYN) 500 MG tablet, Take 1 tablet (500 mg total) by mouth 2 (two) times daily with a meal., Disp: 60 tablet, Rfl: 1 .  sitaGLIPtin-metformin (JANUMET) 50-1000 MG tablet, TAKE 1 TABLET BY MOUTH TWICE DAILY WITH A MEAL, Disp: 180 tablet, Rfl: 1 .  tamsulosin (FLOMAX) 0.4 MG CAPS capsule, Take 1 capsule (0.4 mg total) by mouth daily., Disp: 90 capsule, Rfl: 1  No Known Allergies Past Medical History:  Diagnosis Date  . Bronchitis    DX 10-03-2012--  ON ANTIBIOTICS  . DM (diabetes mellitus), type 2 ([place])   . History of BPH    W/ BOO  . History of gastric ulcer 2010  . Hyperlipidemia   . Hypertension   . Prostate cancer ([place]) 06/29/12   Adenocarcinoma,gleason=3+4=7,& 3+3=6,PSA=4.23,volume=38cc    Observations/Objective: A&O  No respiratory distress or wheezing audible over the phone Mood, judgement, and thought processes all WNL  Assessment and Plan: 1. Viral URI Discussed symptom management.  2. Cough - Novel Coronavirus, NAA ([facility]); Future - Veritor Flu A/B Waived; Future   Follow Up Instructions:  I discussed the assessment and treatment plan with the patient. The patient was provided an opportunity to ask questions and all were answered. The patient agreed with the plan and demonstrated an understanding of the instructions.   The patient was advised to call back or seek an in-person evaluation if the symptoms worsen or if the condition fails  to improve as anticipated.  The above assessment and management plan was discussed with the patient. The patient verbalized understanding of and has agreed to the management plan. Patient is aware to call the clinic if symptoms persist or worsen. Patient is aware when to return to the clinic for a follow-up visit. Patient educated on when it is appropriate to go to the emergency department.   Time call ended: 10:03 AM  I provided  11 minutes of non-face-to-face time during this encounter.  Hendricks Limes, MSN, APRN, FNP-C [facility] 10/03/20

## 2020-10-03 NOTE — Addendum Note (Signed)
Addended by: Liliane Bade on: 10/03/2020 11:49 AM   Modules accepted: Orders

## 2020-10-04 LAB — SARS-COV-2, NAA 2 DAY TAT

## 2020-10-04 LAB — NOVEL CORONAVIRUS, NAA: SARS-CoV-2, NAA: NOT DETECTED

## 2020-11-13 ENCOUNTER — Ambulatory Visit: Payer: Self-pay | Admitting: Nurse Practitioner

## 2020-11-24 ENCOUNTER — Ambulatory Visit (INDEPENDENT_AMBULATORY_CARE_PROVIDER_SITE_OTHER): Payer: Managed Care, Other (non HMO) | Admitting: Nurse Practitioner

## 2020-11-24 ENCOUNTER — Other Ambulatory Visit: Payer: Self-pay

## 2020-11-24 ENCOUNTER — Encounter: Payer: Self-pay | Admitting: Nurse Practitioner

## 2020-11-24 VITALS — BP 141/81 | HR 77 | Temp 98.1°F | Resp 20 | Ht 66.0 in | Wt 182.0 lb

## 2020-11-24 DIAGNOSIS — Z6832 Body mass index (BMI) 32.0-32.9, adult: Secondary | ICD-10-CM | POA: Diagnosis not present

## 2020-11-24 DIAGNOSIS — I10 Essential (primary) hypertension: Secondary | ICD-10-CM | POA: Diagnosis not present

## 2020-11-24 DIAGNOSIS — E785 Hyperlipidemia, unspecified: Secondary | ICD-10-CM | POA: Diagnosis not present

## 2020-11-24 DIAGNOSIS — E119 Type 2 diabetes mellitus without complications: Secondary | ICD-10-CM | POA: Diagnosis not present

## 2020-11-24 DIAGNOSIS — C61 Malignant neoplasm of prostate: Secondary | ICD-10-CM

## 2020-11-24 LAB — BAYER DCA HB A1C WAIVED: HB A1C (BAYER DCA - WAIVED): 6.1 % (ref <7.0)

## 2020-11-24 MED ORDER — TAMSULOSIN HCL 0.4 MG PO CAPS: 0.4000 mg | ORAL_CAPSULE | Freq: Every day | ORAL | 1 refills | Status: DC

## 2020-11-24 MED ORDER — FENOFIBRATE 145 MG PO TABS: ORAL_TABLET | ORAL | 1 refills | Status: DC

## 2020-11-24 MED ORDER — AMLODIPINE BESYLATE 5 MG PO TABS: ORAL_TABLET | ORAL | 1 refills | Status: DC

## 2020-11-24 MED ORDER — JANUMET 50-1000 MG PO TABS
ORAL_TABLET | ORAL | 1 refills | Status: DC
Start: 2020-11-24 — End: 2021-03-25

## 2020-11-24 MED ORDER — LISINOPRIL-HYDROCHLOROTHIAZIDE 20-25 MG PO TABS: 2.0000 | ORAL_TABLET | Freq: Every day | ORAL | 1 refills | Status: DC

## 2020-11-24 MED ORDER — ATORVASTATIN CALCIUM 40 MG PO TABS: 40.0000 mg | ORAL_TABLET | Freq: Every day | ORAL | 1 refills | Status: DC

## 2020-11-24 NOTE — Progress Notes (Signed)
Subjective:    Patient ID: Brandon Elliott, male    DOB: 05-Mar-1956, 65 y.o.   MRN: [id number]   Chief Complaint: medical management of chronic issues     HPI:  1. Hyperlipidemia, unspecified hyperlipidemia type Does not watch diet and does no exercise. Lab Results  Component Value Date   CHOL 102 08/13/2020   HDL 38 (L) 08/13/2020   LDLCALC 44 08/13/2020   TRIG 106 08/13/2020   CHOLHDL 2.7 08/13/2020     2. Type 2 diabetes mellitus without complication, without long-term current use of insulin ([place]) He is not check ing his blood sugars at home. He is trying to watch diet. Lab Results  Component Value Date   HGBA1C 6.6 08/13/2020      3. Primary hypertension No c/o chest pain, sob ir headache. Does not check blood pressure at home. BP Readings from Last 3 Encounters:  08/13/20 126/79  01/14/20 131/77  12/10/19 139/69    4. BMI 32.0-32.9,adult No recent weight changes Wt Readings from Last 3 Encounters:  11/24/20 182 lb (82.6 kg)  08/13/20 181 lb (82.1 kg)  01/14/20 178 lb (80.7 kg)   BMI Readings from Last 3 Encounters:  11/24/20 29.38 kg/m  08/13/20 29.21 kg/m  01/14/20 28.73 kg/m     5. Prostate cancer [facility]) denies any problems voiding. Is on flomax daily.    Outpatient Encounter Medications as of 11/24/2020  Medication Sig  . amLODipine (NORVASC) 5 MG tablet TAKE 1 TABLET BY MOUTH ONCE DAILY (Needs to be seen before next refill)  . atorvastatin (LIPITOR) 40 MG tablet Take 1 tablet (40 mg total) by mouth daily.  . cyclobenzaprine (FLEXERIL) 10 MG tablet Take 1 tablet (10 mg total) by mouth 3 (three) times daily as needed for muscle spasms.  . fenofibrate (TRICOR) 145 MG tablet TAKE 1 TABLET BY MOUTH ONCE DAILY (Needs to be seen before next refill)  . lisinopril-hydrochlorothiazide (ZESTORETIC) 20-25 MG tablet Take 2 tablets by mouth daily. (Needs to be seen before next refill)  . naproxen (NAPROSYN) 500 MG tablet Take 1 tablet (500 mg total) by  mouth 2 (two) times daily with a meal.  . sitaGLIPtin-metformin (JANUMET) 50-1000 MG tablet TAKE 1 TABLET BY MOUTH TWICE DAILY WITH A MEAL  . tamsulosin (FLOMAX) 0.4 MG CAPS capsule Take 1 capsule (0.4 mg total) by mouth daily.   No facility-administered encounter medications on file as of 11/24/2020.    Past Surgical History:  Procedure Laterality Date  . RADIOACTIVE SEED IMPLANT N/A 10/16/2012   Procedure: RADIOACTIVE SEED IMPLANT;  Surgeon: Bernestine Amass, MD;  Location: [facility];  Service: Urology;  Laterality: N/A;  39 seeds implanted no seeds found in bladder     Family History  Problem Relation Age of Onset  . Stroke Mother   . Cancer Father        prostate    New complaints: None   Social history: Lives with is girlfriend  Controlled substance contract: n/a    Review of Systems  Constitutional: Negative for diaphoresis.  Eyes: Negative for pain.  Respiratory: Negative for shortness of breath.   Cardiovascular: Negative for chest pain, palpitations and leg swelling.  Gastrointestinal: Negative for abdominal pain.  Endocrine: Negative for polydipsia.  Skin: Negative for rash.  Neurological: Negative for dizziness, weakness and headaches.  Hematological: Does not bruise/bleed easily.  All other systems reviewed and are negative.      Objective:   Physical Exam Vitals and nursing note  reviewed.  Constitutional:      Appearance: Normal appearance. He is well-developed.  HENT:     Head: Normocephalic.     Nose: Nose normal.  Eyes:     Pupils: Pupils are equal, round, and reactive to light.  Neck:     Thyroid: No thyroid mass or thyromegaly.     Vascular: No carotid bruit or JVD.     Trachea: Phonation normal.  Cardiovascular:     Rate and Rhythm: Normal rate and regular rhythm.  Pulmonary:     Effort: Pulmonary effort is normal. No respiratory distress.     Breath sounds: Normal breath sounds.  Abdominal:     General: Bowel sounds are  normal.     Palpations: Abdomen is soft.     Tenderness: There is no abdominal tenderness.  Musculoskeletal:        General: Normal range of motion.     Cervical back: Normal range of motion and neck supple.  Lymphadenopathy:     Cervical: No cervical adenopathy.  Skin:    General: Skin is warm and dry.  Neurological:     Mental Status: He is alert and oriented to person, place, and time.  Psychiatric:        Behavior: Behavior normal.        Thought Content: Thought content normal.        Judgment: Judgment normal.     BP (!) 141/81   Pulse 77   Temp 98.1 F (36.7 C) (Temporal)   Resp 20   Ht 5' 6"  (1.676 m)   Wt 182 lb (82.6 kg)   SpO2 98%   BMI 29.38 kg/m   hgba1c 6.1%     Assessment & Plan:  Brandon Elliott comes in today with chief complaint of Medical Management of Chronic Issues   Diagnosis and orders addressed:  1. Hyperlipidemia, unspecified hyperlipidemia type Low fat diet - Lipid panel - atorvastatin (LIPITOR) 40 MG tablet; Take 1 tablet (40 mg total) by mouth daily.  Dispense: 90 tablet; Refill: 1 - fenofibrate (TRICOR) 145 MG tablet; TAKE 1 TABLET BY MOUTH ONCE DAILY (Needs to be seen before next refill)  Dispense: 90 tablet; Refill: 1  2. Type 2 diabetes mellitus without complication, without long-term current use of insulin (HCC) Continue to watch carbsin diet - Bayer DCA Hb A1c Waived - Microalbumin / creatinine urine ratio - sitaGLIPtin-metformin (JANUMET) 50-1000 MG tablet; TAKE 1 TABLET BY MOUTH TWICE DAILY WITH A MEAL  Dispense: 180 tablet; Refill: 1  3. Primary hypertension Low sodium diet - CBC with Differential/Platelet - CMP14+EGFR - lisinopril-hydrochlorothiazide (ZESTORETIC) 20-25 MG tablet; Take 2 tablets by mouth daily. (Needs to be seen before next refill)  Dispense: 180 tablet; Refill: 1 - amLODipine (NORVASC) 5 MG tablet; TAKE 1 TABLET BY MOUTH ONCE DAILY (Needs to be seen before next refill)  Dispense: 90 tablet; Refill: 1  4.  BMI 32.0-32.9,adult Discussed diet and exercise for person with BMI >25 Will recheck weight in 3-6 months  5. Prostate cancer ([place]) Need to follow up with urology - tamsulosin (FLOMAX) 0.4 MG CAPS capsule; Take 1 capsule (0.4 mg total) by mouth daily.  Dispense: 90 capsule; Refill: 1 -PSA  Labs pending Health Maintenance reviewed Diet and exercise encouraged  Follow up plan: 6 months   [place], FNP

## 2020-11-25 LAB — CBC WITH DIFFERENTIAL/PLATELET
Basophils Absolute: 0 10*3/uL (ref 0.0–0.2)
Basos: 1 %
EOS (ABSOLUTE): 0.1 10*3/uL (ref 0.0–0.4)
Eos: 1 %
Hematocrit: 43.6 % (ref 37.5–51.0)
Hemoglobin: 14.9 g/dL (ref 13.0–17.7)
Immature Grans (Abs): 0 10*3/uL (ref 0.0–0.1)
Immature Granulocytes: 0 %
Lymphocytes Absolute: 2.8 10*3/uL (ref 0.7–3.1)
Lymphs: 45 %
MCH: 32.5 pg (ref 26.6–33.0)
MCHC: 34.2 g/dL (ref 31.5–35.7)
MCV: 95 fL (ref 79–97)
Monocytes Absolute: 0.6 10*3/uL (ref 0.1–0.9)
Monocytes: 10 %
Neutrophils Absolute: 2.6 10*3/uL (ref 1.4–7.0)
Neutrophils: 43 %
Platelets: 211 10*3/uL (ref 150–450)
RBC: 4.58 x10E6/uL (ref 4.14–5.80)
RDW: 13.7 % (ref 11.6–15.4)
WBC: 6.1 10*3/uL (ref 3.4–10.8)

## 2020-11-25 LAB — LIPID PANEL
Chol/HDL Ratio: 2.5 ratio (ref 0.0–5.0)
Cholesterol, Total: 121 mg/dL (ref 100–199)
HDL: 48 mg/dL (ref >39)
LDL Chol Calc (NIH): 57 mg/dL (ref 0–99)
Triglycerides: 80 mg/dL (ref 0–149)
VLDL Cholesterol Cal: 16 mg/dL (ref 5–40)

## 2020-11-25 LAB — CMP14+EGFR
ALT: 31 IU/L (ref 0–44)
AST: 30 IU/L (ref 0–40)
Albumin/Globulin Ratio: 1.9 (ref 1.2–2.2)
Albumin: 4.4 g/dL (ref 3.8–4.8)
Alkaline Phosphatase: 49 IU/L (ref 44–121)
BUN/Creatinine Ratio: 11 (ref 10–24)
BUN: 12 mg/dL (ref 8–27)
Bilirubin Total: 0.5 mg/dL (ref 0.0–1.2)
CO2: 24 mmol/L (ref 20–29)
Calcium: 9.9 mg/dL (ref 8.6–10.2)
Chloride: 103 mmol/L (ref 96–106)
Creatinine, Ser: 1.05 mg/dL (ref 0.76–1.27)
Globulin, Total: 2.3 g/dL (ref 1.5–4.5)
Glucose: 104 mg/dL — ABNORMAL HIGH (ref 65–99)
Potassium: 4.1 mmol/L (ref 3.5–5.2)
Sodium: 144 mmol/L (ref 134–144)
Total Protein: 6.7 g/dL (ref 6.0–8.5)
eGFR: 78 mL/min/{1.73_m2} (ref >59)

## 2020-11-25 LAB — MICROALBUMIN / CREATININE URINE RATIO
Creatinine, Urine: 193.6 mg/dL
Microalb/Creat Ratio: 114 mg/g creat — ABNORMAL HIGH (ref 0–29)
Microalbumin, Urine: 219.9 ug/mL

## 2020-11-26 LAB — PSA, TOTAL AND FREE
PSA, Free: <0.01 ng/mL
Prostate Specific Ag, Serum: <0.1 ng/mL (ref 0.0–4.0)

## 2020-11-26 LAB — SPECIMEN STATUS REPORT

## 2020-12-06 IMAGING — US US SOFT TISSUE HEAD/NECK
1 series · 14 of 25 positions shown · non-contrast
Comparison: None.

CLINICAL DATA: Palpable abnormality within the left side of the
neck.

EXAM:
ULTRASOUND OF HEAD/NECK SOFT TISSUES
TECHNIQUE: Ultrasound examination of the head and neck soft tissues was
performed in the area of clinical concern.

[Series 1: us soft tissue head/neck · 0.06mm/px · 14 of 39 slices shown]
[im 1/39]
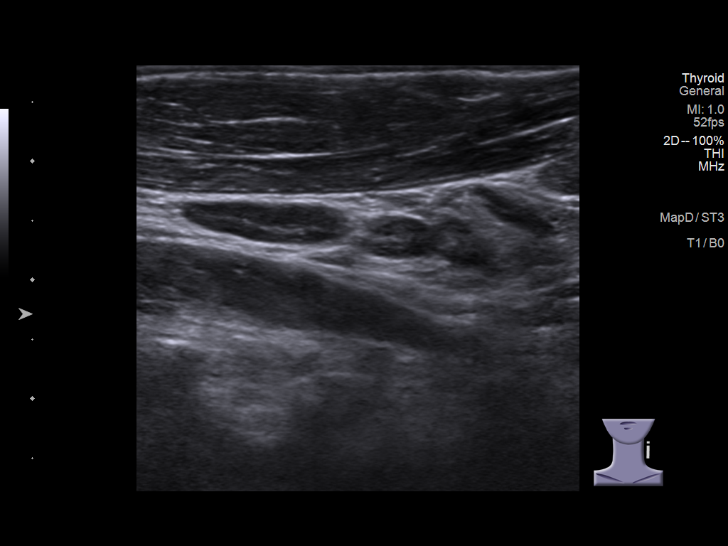
[im 4/39]
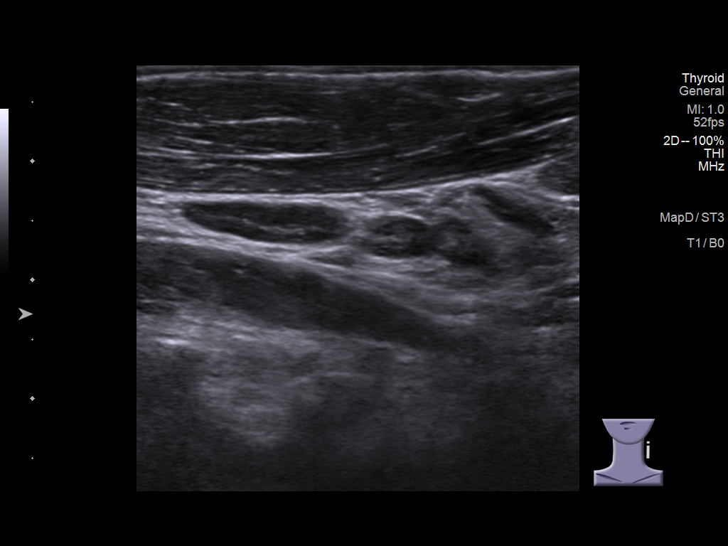
[im 7/39]
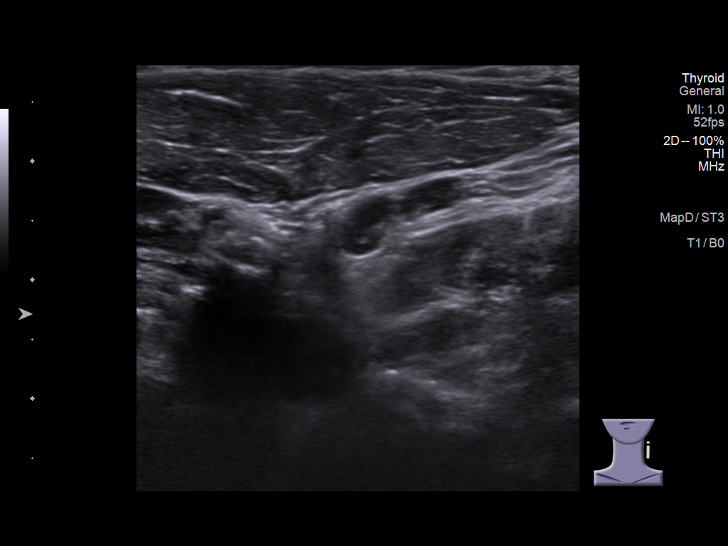
[im 10/39]
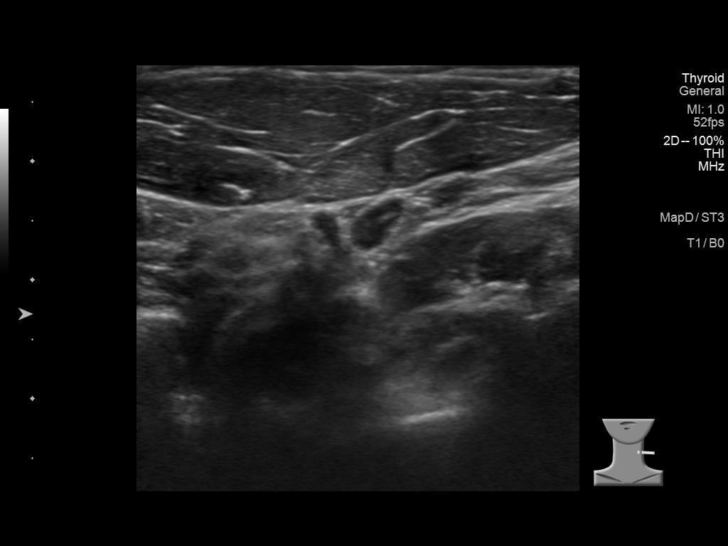
[im 13/39]
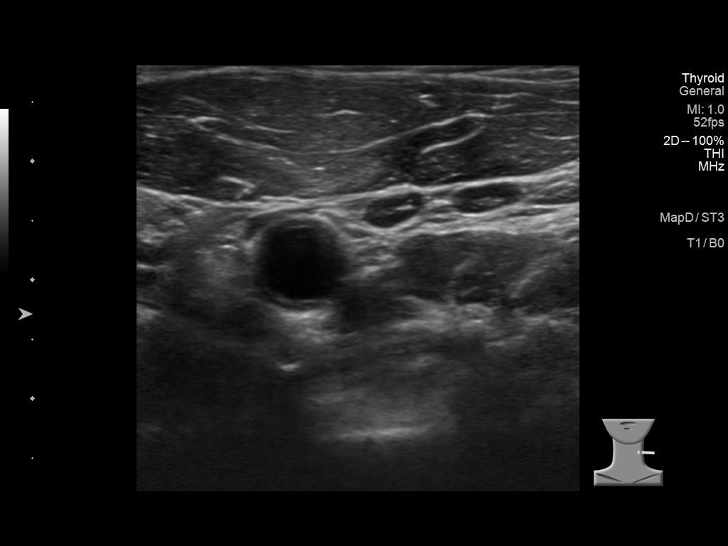
[im 15/39]
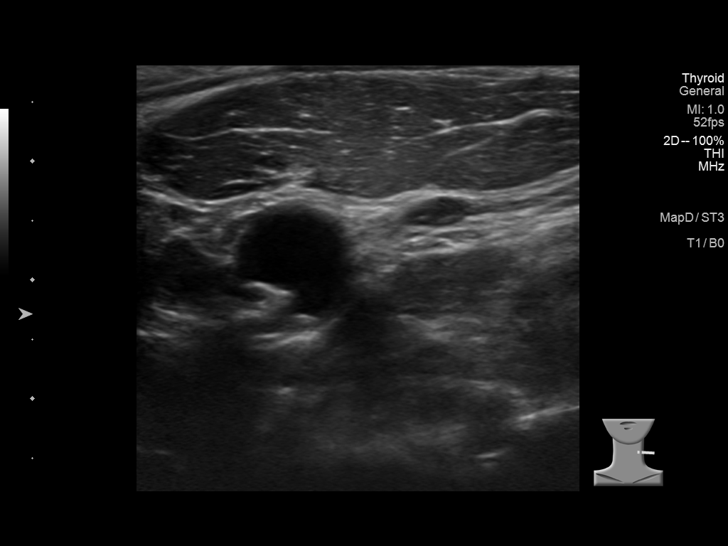
[im 18/39]
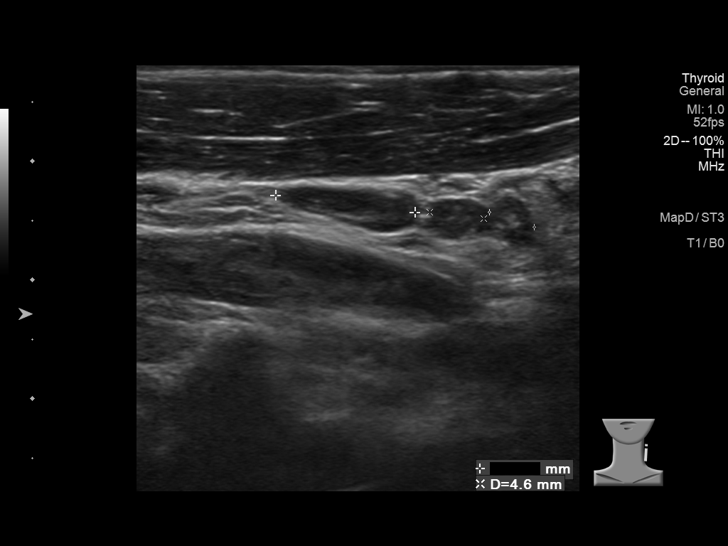
[im 21/39]
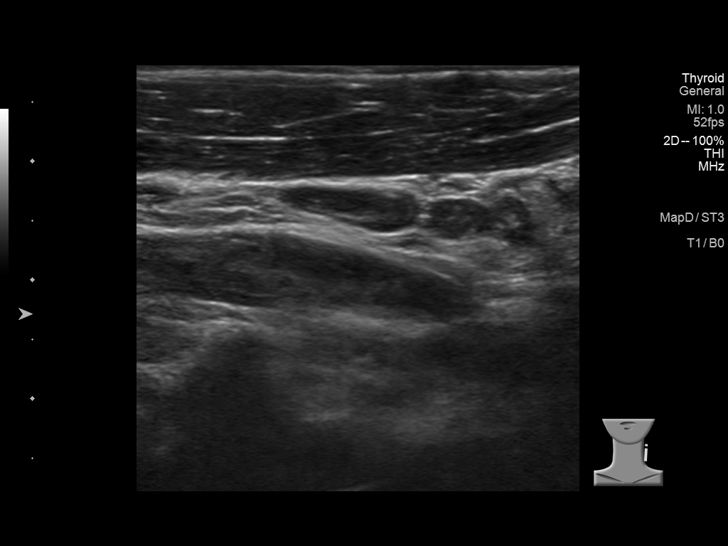
[im 24/39]
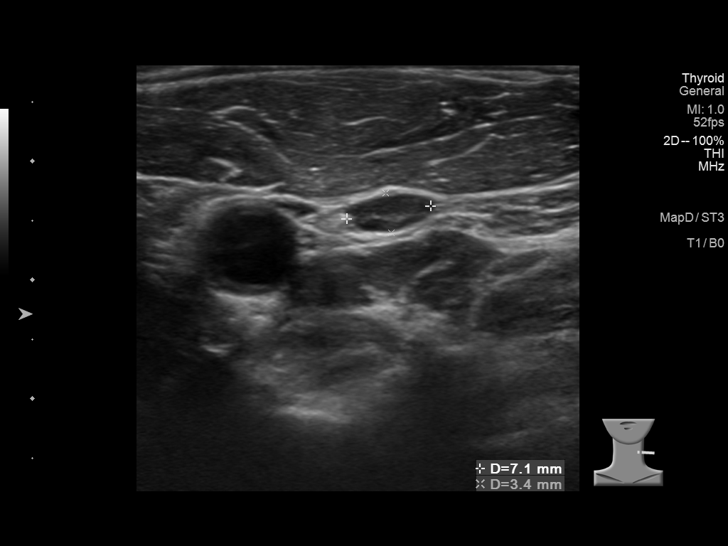
[im 26/39]
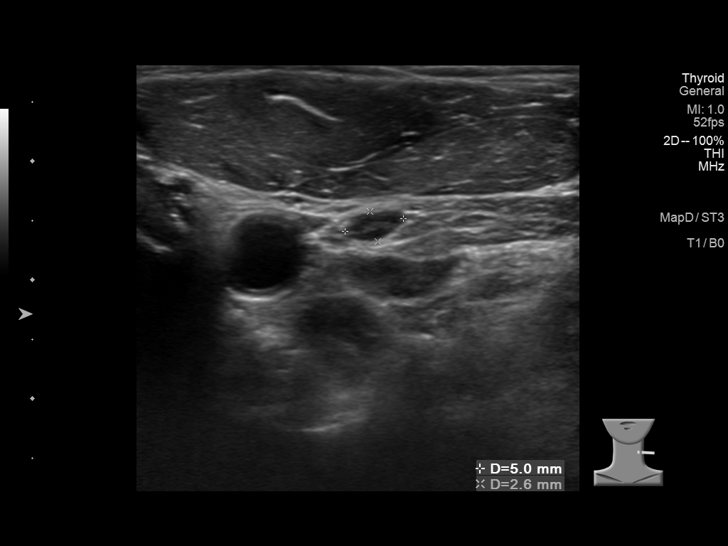
[im 29/39]
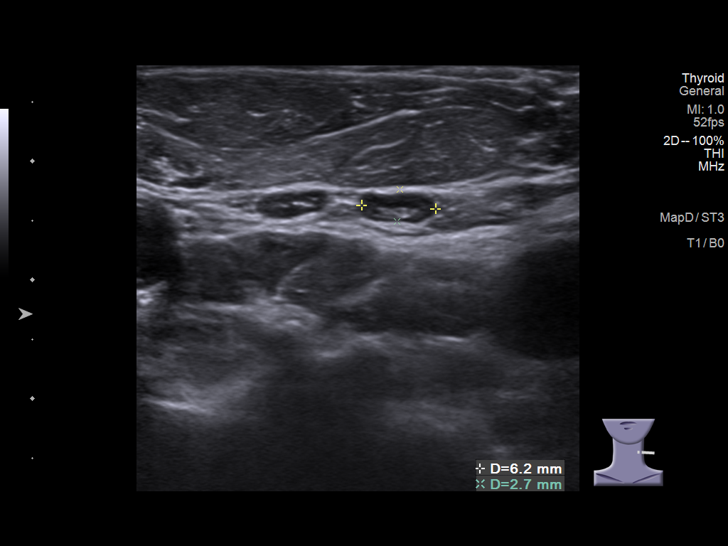
[im 32/39]
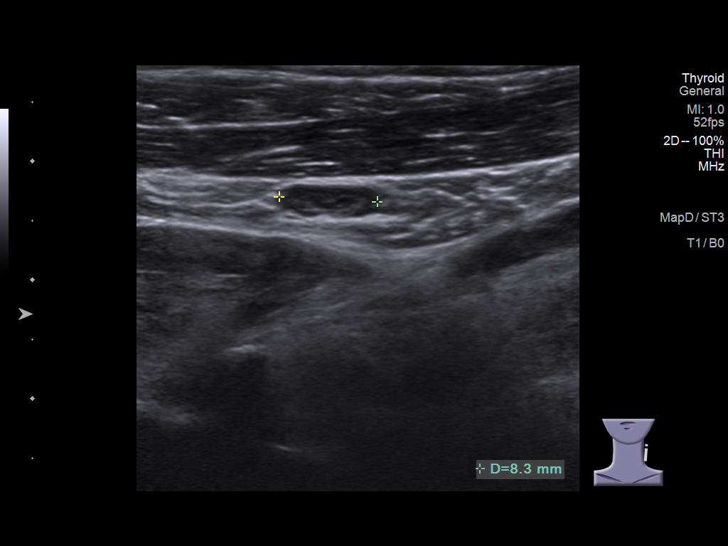
[im 35/39]
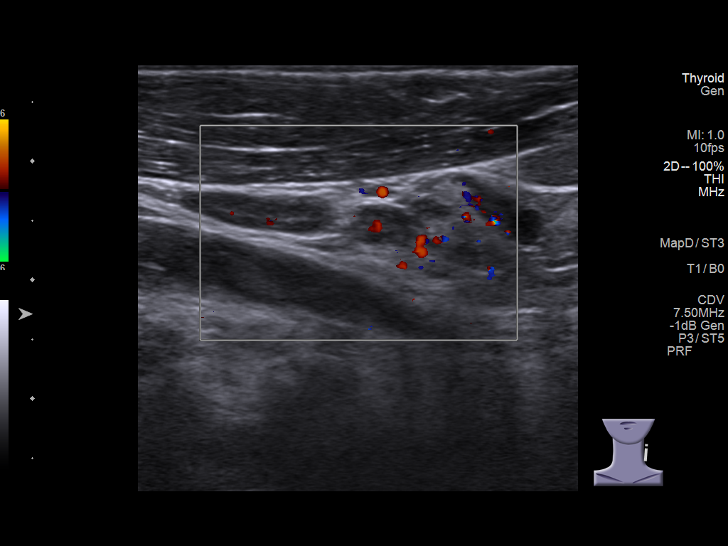
[im 39/39]
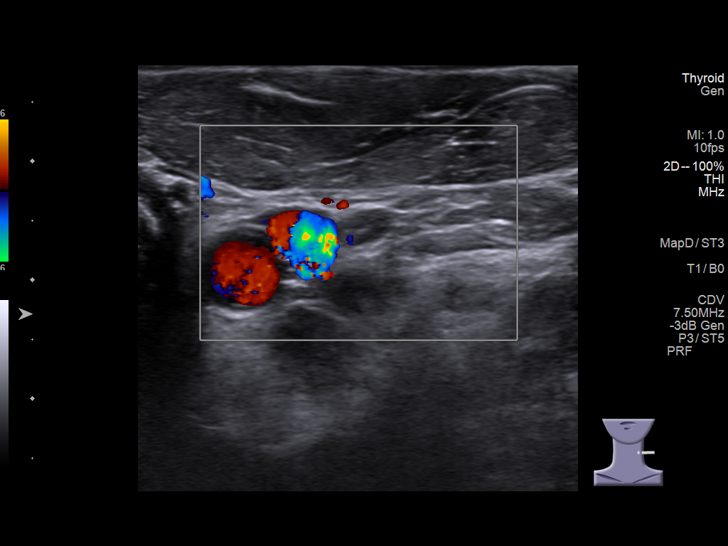

[14 of 25 positions shown; findings below may reference images not displayed]

FINDINGS: Patient's palpable area of concern within the left neck correlates
with several adjacent prominent though non pathologically enlarged
cervical lymph nodes. Dominant left cervical lymph node is not
enlarged by size criteria measuring 0.4 cm in greatest short axis
diameter and all maintain benign fatty hila

There is no additional sonographic correlate for patient's palpable
area of concern. Specifically, no discrete solid or cystic lesions.
IMPRESSION: Patient's palpable area of concern within the left neck correlates
with several adjacent prominent though non pathologically enlarged
cervical lymph nodes, nonspecific though presumably reactive in
etiology. Clinical correlation to resolution is advised.

## 2021-02-17 ENCOUNTER — Other Ambulatory Visit: Payer: Self-pay | Admitting: Nurse Practitioner

## 2021-02-17 DIAGNOSIS — C61 Malignant neoplasm of prostate: Secondary | ICD-10-CM

## 2021-02-21 IMAGING — DX DG CHEST 2V
2 series · 2 of 2 positions shown · non-contrast
Comparison: 08/20/2019

CLINICAL DATA: Food impaction.

EXAM:
CHEST - 2 VIEW

[chest pa]
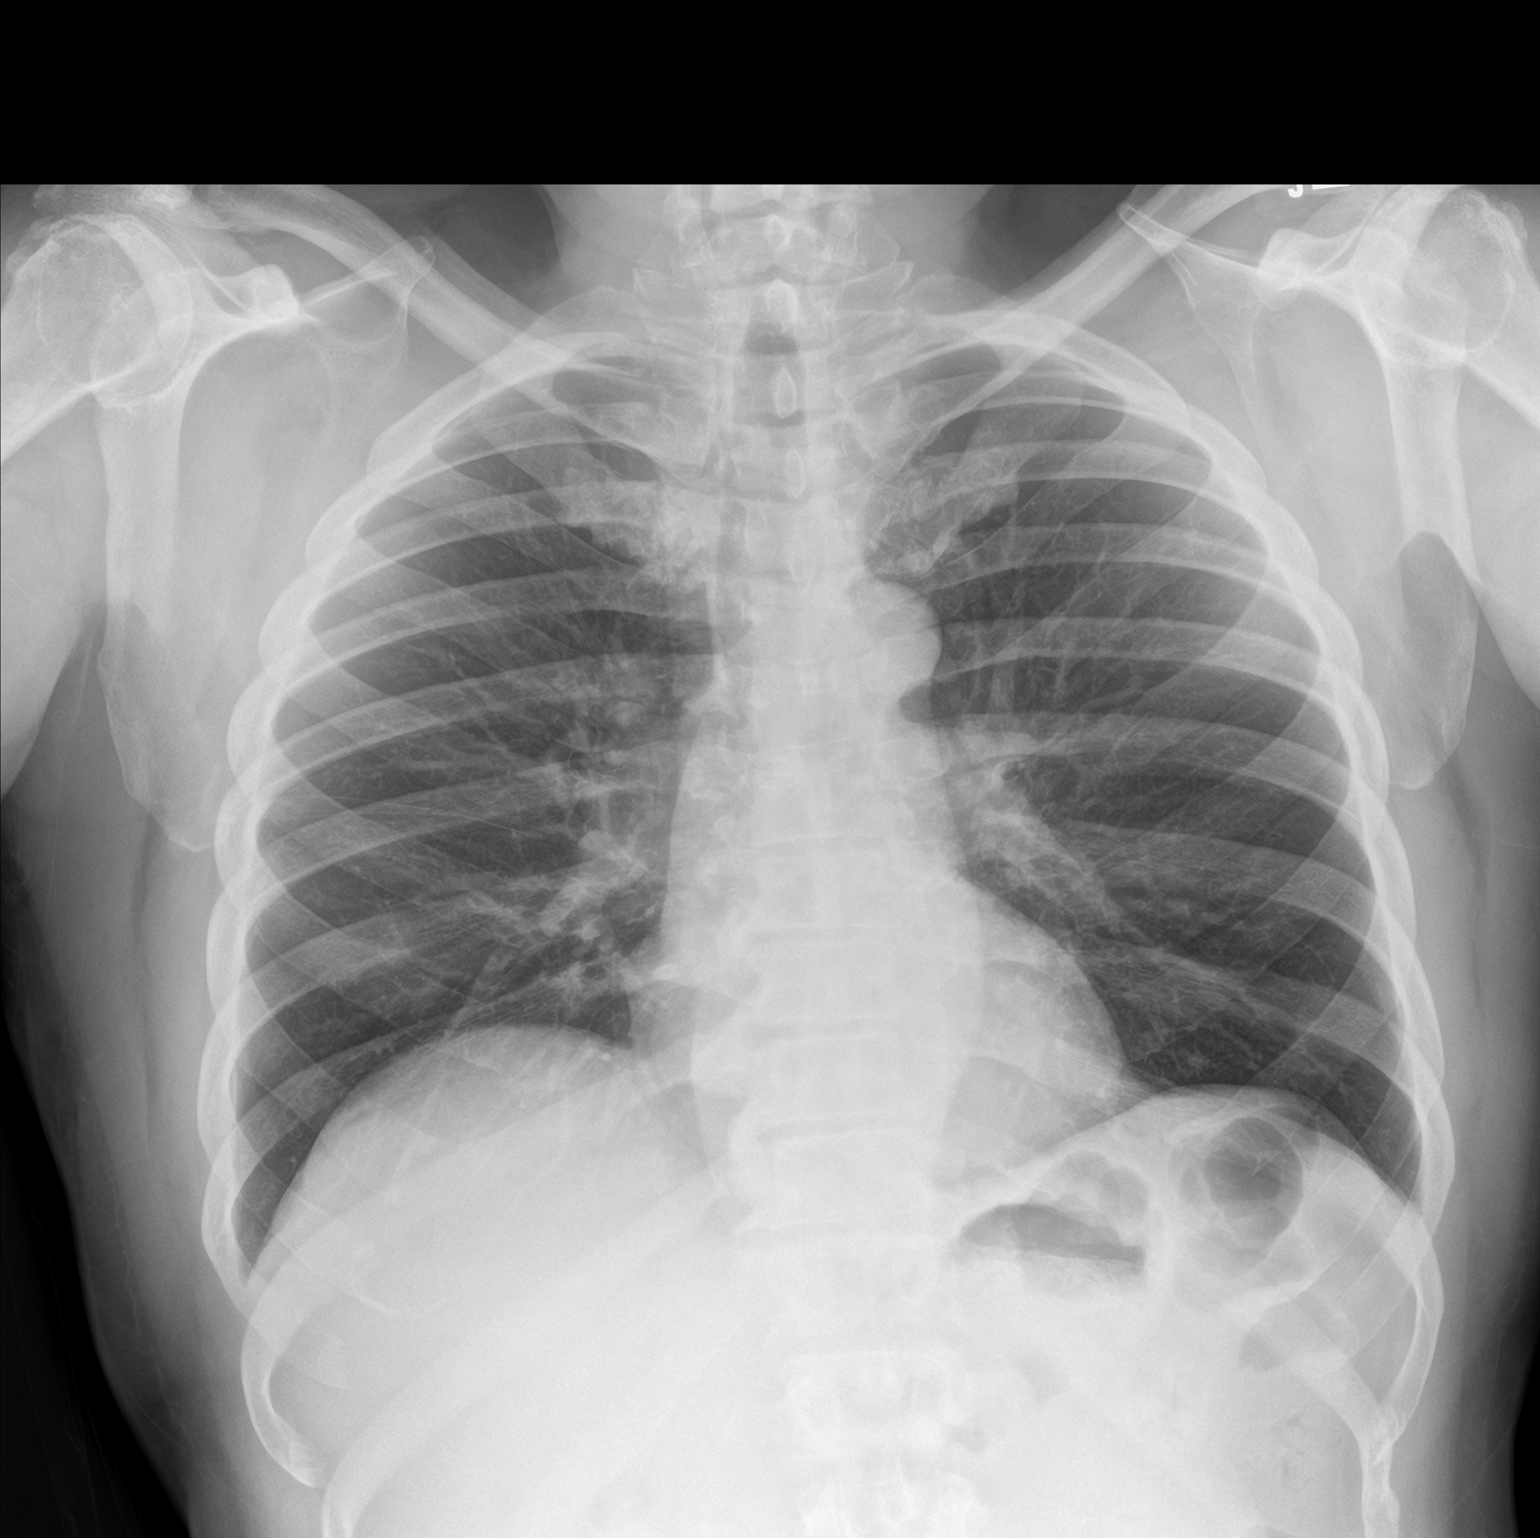

[chest lat]
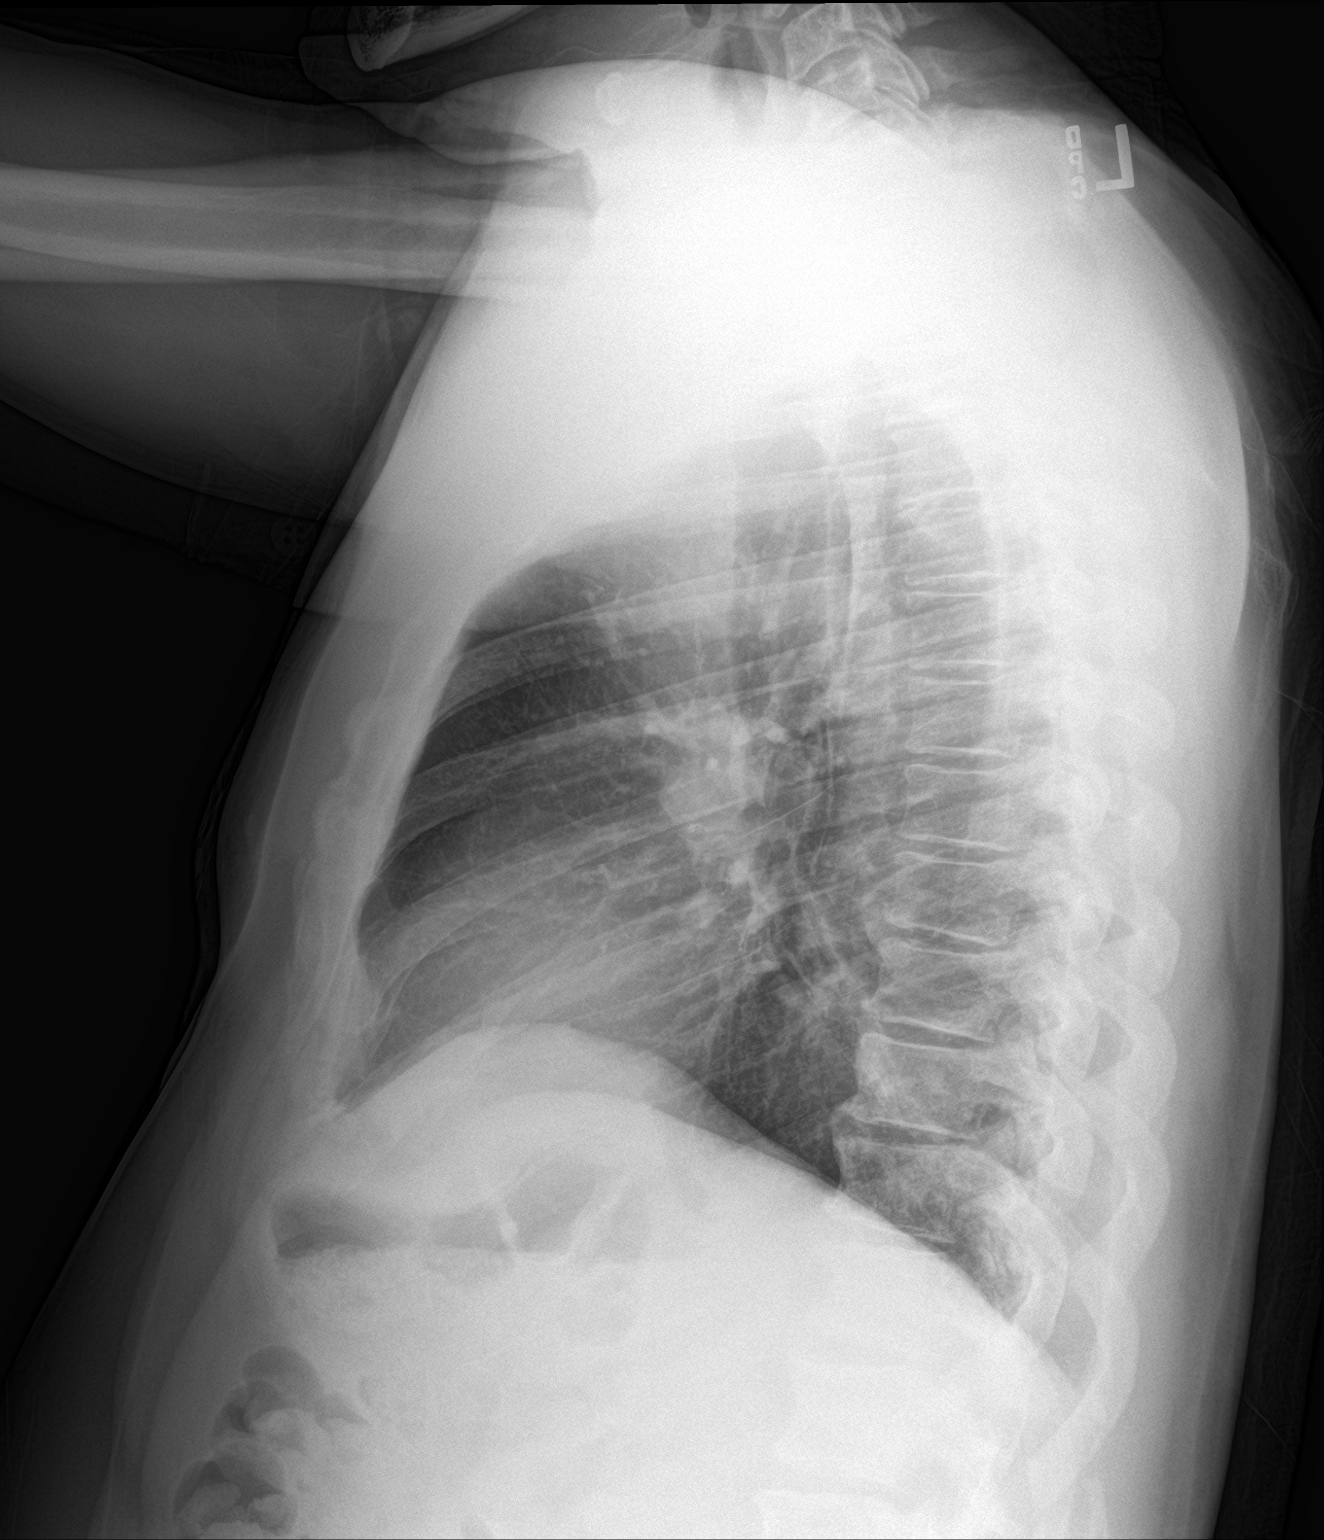

[2 of 2 positions shown; findings below may reference images not displayed]

FINDINGS: There is a rounded density projecting over the right mid lung zone.
There is no pneumothorax. No pleural effusion. No focal infiltrate.
The heart size is stable. There is no acute osseous abnormality.
IMPRESSION: 1. No acute cardiopulmonary process.
2. Rounded density projecting over the right mid lung zone. Repeat
two-view chest x-ray with nipple markers as an outpatient is
recommended for further evaluation of this finding.

## 2021-03-24 ENCOUNTER — Other Ambulatory Visit: Payer: Self-pay | Admitting: Nurse Practitioner

## 2021-03-24 DIAGNOSIS — E119 Type 2 diabetes mellitus without complications: Secondary | ICD-10-CM

## 2021-04-14 ENCOUNTER — Encounter: Payer: Self-pay | Admitting: Nurse Practitioner

## 2021-04-14 ENCOUNTER — Other Ambulatory Visit (HOSPITAL_COMMUNITY)
Admission: RE | Admit: 2021-04-14 | Discharge: 2021-04-14 | Disposition: A | Discharge status: Home / Self Care | Payer: Managed Care, Other (non HMO) | Source: Ambulatory Visit | Attending: Nurse Practitioner | Admitting: Nurse Practitioner

## 2021-04-14 ENCOUNTER — Other Ambulatory Visit: Payer: Self-pay

## 2021-04-14 ENCOUNTER — Ambulatory Visit: Payer: Managed Care, Other (non HMO) | Admitting: Nurse Practitioner

## 2021-04-14 VITALS — BP 153/83 | HR 79 | Temp 98.0°F | Resp 20 | Ht 66.0 in | Wt 176.0 lb

## 2021-04-14 DIAGNOSIS — N451 Epididymitis: Secondary | ICD-10-CM | POA: Diagnosis present

## 2021-04-14 DIAGNOSIS — E876 Hypokalemia: Secondary | ICD-10-CM

## 2021-04-14 NOTE — Progress Notes (Signed)
   Subjective:    Patient ID: Brandon Elliott, male    DOB: 31-Oct-1955, 65 y.o.   MRN: [id number]  Chief Complaint: Hospitalization Follow-up   HPI Patient went to urgent care on 03/23/21. He was having swelling  testicles. They dx him with epidymidis and orchitis. They gave him a rocephin shot and doxycycline to take at home. While at hospital they did labs and his potassium was low and he is here for recheck.Potassium was 3.3. Since coming home he is doing better. His testicles are still slightly swollen and uncomfortable. Pain to walk or get in and out a vehicle. He denie snay lower ext cramping from low potassium.   Review of Systems  Constitutional:  Negative for diaphoresis.  Eyes:  Negative for pain.  Respiratory:  Negative for shortness of breath.   Cardiovascular:  Negative for chest pain, palpitations and leg swelling.  Gastrointestinal:  Negative for abdominal pain.  Endocrine: Negative for polydipsia.  Genitourinary:  Positive for scrotal swelling. Negative for dysuria, frequency, penile pain, penile swelling, testicular pain and urgency.  Skin:  Negative for rash.  Neurological:  Negative for dizziness, weakness and headaches.  Hematological:  Does not bruise/bleed easily.  All other systems reviewed and are negative.     Objective:   Physical Exam Vitals and nursing note reviewed.  Constitutional:      Appearance: Normal appearance.  Cardiovascular:     Rate and Rhythm: Normal rate and regular rhythm.     Heart sounds: Normal heart sounds.  Pulmonary:     Effort: Pulmonary effort is normal.     Breath sounds: Normal breath sounds.  Abdominal:     General: Abdomen is flat.     Palpations: Abdomen is soft.  Genitourinary:    Comments: Mild left testicular swelling with slight pain on palpation Skin:    General: Skin is warm.  Neurological:     General: No focal deficit present.     Mental Status: He is alert and oriented to person, place, and time.  Psychiatric:         Mood and Affect: Mood normal.        Behavior: Behavior normal.    BP (!) 153/83   Pulse 79   Temp 98 F (36.7 C) (Temporal)   Resp 20   Ht 5\' 6"  (1.676 m)   Wt 176 lb (79.8 kg)   SpO2 96%   BMI 28.41 kg/m        Assessment & Plan:   [name] in today with chief complaint of Hospitalization Follow-up   1. Epididymitis, left Labs pending Finish doxycycline Out of work till [place] [employer], Pharyngeal  2. Hypokalemia Labs pending Hospital records reviewed   The above assessment and management plan was discussed with the patient. The patient verbalized understanding of and has agreed to the management plan. Patient is aware to call the clinic if symptoms persist or worsen. Patient is aware when to return to the clinic for a follow-up visit. Patient educated on when it is appropriate to go to the emergency department.   Mary-Margaret Hassell Done, FNP

## 2021-04-14 NOTE — Patient Instructions (Signed)
Hypokalemia Hypokalemia means that the amount of potassium in the blood is lower than normal. Potassium is a chemical (electrolyte) that helps regulate the amount of fluid in the body. It also stimulates muscle tightening (contraction) and helps nerves work properly. Normally, most of the body's potassium is inside cells, and only a very small amount is in the blood. Because the amount in the blood is so small, minor changes to potassium levels in the blood can be life-threatening. What are the causes? This condition may be caused by: Antibiotic medicine. Diarrhea or vomiting. Taking too much of a medicine that helps you have a bowel movement (laxative) can cause diarrhea and lead to hypokalemia. Chronic kidney disease (CKD). Medicines that help the body get rid of excess fluid (diuretics). Eating disorders, such as bulimia. Low magnesium levels in the body. Sweating a lot. What are the signs or symptoms? Symptoms of this condition include: Weakness. Constipation. Fatigue. Muscle cramps. Mental confusion. Skipped heartbeats or irregular heartbeat (palpitations). Tingling or numbness. How is this diagnosed? This condition is diagnosed with a blood test. How is this treated? This condition may be treated by: Taking potassium supplements by mouth. Adjusting the medicines that you take. Eating more foods that contain a lot of potassium. If your potassium level is very low, you may need to get potassium through an IV and be monitored in the hospital. Follow these instructions at home:  Take over-the-counter and prescription medicines only as told by your health care provider. This includes vitamins and supplements. Eat a healthy diet. A healthy diet includes fresh fruits and vegetables, whole grains, healthy fats, and lean proteins. If instructed, eat more foods that contain a lot of potassium. This includes: Nuts, such as peanuts and pistachios. Seeds, such as sunflower seeds and  pumpkin seeds. Peas, lentils, and lima beans. Whole grain and bran cereals and breads. Fresh fruits and vegetables, such as apricots, avocado, bananas, cantaloupe, kiwi, oranges, tomatoes, asparagus, and potatoes. Orange juice. Tomato juice. Red meats. Yogurt. Keep all follow-up visits as told by your health care provider. This is important. Contact a health care provider if you: Have weakness that gets worse. Feel your heart pounding or racing. Vomit. Have diarrhea. Have diabetes (diabetes mellitus) and you have trouble keeping your blood sugar (glucose) in your target range. Get help right away if you: Have chest pain. Have shortness of breath. Have vomiting or diarrhea that lasts for more than 2 days. Faint. Summary Hypokalemia means that the amount of potassium in the blood is lower than normal. This condition is diagnosed with a blood test. Hypokalemia may be treated by taking potassium supplements, adjusting the medicines that you take, or eating more foods that are high in potassium. If your potassium level is very low, you may need to get potassium through an IV and be monitored in the hospital. This information is not intended to replace advice given to you by your health care provider. Make sure you discuss any questions you have with your health care provider. Document Revised: 02/14/2018 Document Reviewed: 02/15/2018 Elsevier Patient Education  2022 [employer]vier Inc.  

## 2021-04-14 NOTE — Addendum Note (Signed)
Addended by: Rolena Infante on: 04/14/2021 11:15 AM   Modules accepted: Orders

## 2021-04-15 LAB — URINE CYTOLOGY ANCILLARY ONLY
Chlamydia: NEGATIVE
Comment: NEGATIVE
Comment: NEGATIVE
Comment: NORMAL
Neisseria Gonorrhea: NEGATIVE
Trichomonas: NEGATIVE

## 2021-05-28 ENCOUNTER — Ambulatory Visit: Payer: Self-pay | Admitting: Nurse Practitioner

## 2021-05-29 ENCOUNTER — Ambulatory Visit: Payer: Managed Care, Other (non HMO) | Admitting: Nurse Practitioner

## 2021-05-29 ENCOUNTER — Encounter: Payer: Self-pay | Admitting: Nurse Practitioner

## 2021-06-02 ENCOUNTER — Ambulatory Visit (INDEPENDENT_AMBULATORY_CARE_PROVIDER_SITE_OTHER): Payer: Managed Care, Other (non HMO) | Admitting: Nurse Practitioner

## 2021-06-02 ENCOUNTER — Other Ambulatory Visit: Payer: Self-pay

## 2021-06-02 ENCOUNTER — Encounter: Payer: Self-pay | Admitting: Nurse Practitioner

## 2021-06-02 VITALS — BP 135/79 | HR 74 | Temp 98.1°F | Resp 20 | Ht 66.0 in | Wt 178.0 lb

## 2021-06-02 DIAGNOSIS — Z23 Encounter for immunization: Secondary | ICD-10-CM

## 2021-06-02 DIAGNOSIS — C61 Malignant neoplasm of prostate: Secondary | ICD-10-CM

## 2021-06-02 DIAGNOSIS — E119 Type 2 diabetes mellitus without complications: Secondary | ICD-10-CM | POA: Diagnosis not present

## 2021-06-02 DIAGNOSIS — I10 Essential (primary) hypertension: Secondary | ICD-10-CM

## 2021-06-02 DIAGNOSIS — Z6832 Body mass index (BMI) 32.0-32.9, adult: Secondary | ICD-10-CM

## 2021-06-02 DIAGNOSIS — E785 Hyperlipidemia, unspecified: Secondary | ICD-10-CM

## 2021-06-02 LAB — BAYER DCA HB A1C WAIVED: HB A1C (BAYER DCA - WAIVED): 6.4 % — ABNORMAL HIGH (ref 4.8–5.6)

## 2021-06-02 MED ORDER — FENOFIBRATE 145 MG PO TABS: ORAL_TABLET | ORAL | 1 refills | Status: DC

## 2021-06-02 MED ORDER — JANUMET 50-1000 MG PO TABS: 1.0000 | ORAL_TABLET | Freq: Two times a day (BID) | ORAL | 0 refills | Status: DC

## 2021-06-02 MED ORDER — ATORVASTATIN CALCIUM 40 MG PO TABS: 40.0000 mg | ORAL_TABLET | Freq: Every day | ORAL | 1 refills | Status: DC

## 2021-06-02 MED ORDER — TAMSULOSIN HCL 0.4 MG PO CAPS: 0.4000 mg | ORAL_CAPSULE | Freq: Every day | ORAL | 1 refills | Status: DC

## 2021-06-02 MED ORDER — LISINOPRIL-HYDROCHLOROTHIAZIDE 20-25 MG PO TABS: 2.0000 | ORAL_TABLET | Freq: Every day | ORAL | 1 refills | Status: DC

## 2021-06-02 MED ORDER — AMLODIPINE BESYLATE 5 MG PO TABS: ORAL_TABLET | ORAL | 1 refills | Status: DC

## 2021-06-02 NOTE — Patient Instructions (Signed)

## 2021-06-02 NOTE — Progress Notes (Signed)
Subjective:    Patient ID: Brandon Elliott, male    DOB: Apr 01, 1956, 65 y.o.   MRN: [id number]  Chief Complaint: Medical Management of Chronic Issues    HPI:  1. Type 2 diabetes mellitus without complication, without long-term current use of insulin (HCC) He has not been checking his blood sugars lately. Not really watching diet. Lab Results  Component Value Date   HGBA1C 6.1 11/24/2020     2. Primary hypertension No c/o chest pain, sob or headache. Does not check blood pressure at home. BP Readings from Last 3 Encounters:  06/02/21 135/79  04/14/21 (!) 153/83  11/24/20 (!) 141/81     3. Hyperlipidemia, unspecified hyperlipidemia type Not really wtahcing diet and does no dedicated exercises Lab Results  Component Value Date   CHOL 121 11/24/2020   HDL 48 11/24/2020   LDLCALC 57 11/24/2020   TRIG 80 11/24/2020   CHOLHDL 2.5 11/24/2020     4. Prostate cancer ([place]) No problems voiding Lab Results  Component Value Date   PSA1 <0.1 11/24/2020   PSA1 <0.1 10/22/2019      5. BMI 32.0-32.9,adult No recent weight changes Wt Readings from Last 3 Encounters:  06/02/21 178 lb (80.7 kg)  04/14/21 176 lb (79.8 kg)  11/24/20 182 lb (82.6 kg)   BMI Readings from Last 3 Encounters:  06/02/21 28.73 kg/m  04/14/21 28.41 kg/m  11/24/20 29.38 kg/m       Outpatient Encounter Medications as of 06/02/2021  Medication Sig   amLODipine (NORVASC) 5 MG tablet TAKE 1 TABLET BY MOUTH ONCE DAILY (Needs to be seen before next refill)   atorvastatin (LIPITOR) 40 MG tablet Take 1 tablet (40 mg total) by mouth daily.   fenofibrate (TRICOR) 145 MG tablet TAKE 1 TABLET BY MOUTH ONCE DAILY (Needs to be seen before next refill)   JANUMET 50-1000 MG tablet TAKE 1 TABLET BY MOUTH TWICE DAILY WITH A MEAL   lisinopril-hydrochlorothiazide (ZESTORETIC) 20-25 MG tablet Take 2 tablets by mouth daily. (Needs to be seen before next refill)   naproxen (NAPROSYN) 500 MG tablet Take 1 tablet  (500 mg total) by mouth 2 (two) times daily with a meal.   tamsulosin (FLOMAX) 0.4 MG CAPS capsule Take 1 capsule (0.4 mg total) by mouth daily.   [DISCONTINUED] cyclobenzaprine (FLEXERIL) 10 MG tablet Take 1 tablet (10 mg total) by mouth 3 (three) times daily as needed for muscle spasms.   [DISCONTINUED] cephALEXin (KEFLEX) 500 MG capsule Take 500 mg by mouth every 8 (eight) hours.   [DISCONTINUED] doxycycline (VIBRAMYCIN) 100 MG capsule Take 100 mg by mouth 2 (two) times daily.   No facility-administered encounter medications on file as of 06/02/2021.    Past Surgical History:  Procedure Laterality Date   RADIOACTIVE SEED IMPLANT N/A 10/16/2012   Procedure: RADIOACTIVE SEED IMPLANT;  Surgeon: Bernestine Amass, MD;  Location: [facility];  Service: Urology;  Laterality: N/A;  49 seeds implanted no seeds found in bladder     Family History  Problem Relation Age of Onset   Stroke Mother    Cancer Father        prostate    New complaints: None today  Social history: Lives with his girlfriend  Controlled substance contract: n/a     Review of Systems  Constitutional:  Negative for diaphoresis.  Eyes:  Negative for pain.  Respiratory:  Negative for shortness of breath.   Cardiovascular:  Negative for chest pain, palpitations and leg swelling.  Gastrointestinal:  Negative for abdominal pain.  Endocrine: Negative for polydipsia.  Skin:  Negative for rash.  Neurological:  Negative for dizziness, weakness and headaches.  Hematological:  Does not bruise/bleed easily.  All other systems reviewed and are negative.     Objective:   Physical Exam Vitals and nursing note reviewed.  Constitutional:      Appearance: Normal appearance. He is well-developed.  HENT:     Head: Normocephalic.     Nose: Nose normal.  Eyes:     Pupils: Pupils are equal, round, and reactive to light.  Neck:     Thyroid: No thyroid mass or thyromegaly.     Vascular: No carotid bruit or  JVD.     Trachea: Phonation normal.  Cardiovascular:     Rate and Rhythm: Normal rate and regular rhythm.  Pulmonary:     Effort: Pulmonary effort is normal. No respiratory distress.     Breath sounds: Normal breath sounds.  Abdominal:     General: Bowel sounds are normal.     Palpations: Abdomen is soft.     Tenderness: There is no abdominal tenderness.  Musculoskeletal:        General: Normal range of motion.     Cervical back: Normal range of motion and neck supple.  Lymphadenopathy:     Cervical: No cervical adenopathy.  Skin:    General: Skin is warm and dry.  Neurological:     Mental Status: He is alert and oriented to person, place, and time.  Psychiatric:        Behavior: Behavior normal.        Thought Content: Thought content normal.        Judgment: Judgment normal.    BP 135/79   Pulse 74   Temp 98.1 F (36.7 C) (Temporal)   Resp 20   Ht _0  (1.676 m)   Wt 178 lb (80.7 kg)   SpO2 97%   BMI 28.73 kg/m   Hgba1c 6.8%      Assessment & Plan:   Brandon Elliott comes in today with chief complaint of Medical Management of Chronic Issues   Diagnosis and orders addressed:  1. Type 2 diabetes mellitus without complication, without long-term current use of insulin (HCC) Low carb diet - Bayer DCA Hb A1c Waived - sitaGLIPtin-metformin (JANUMET) 50-1000 MG tablet; Take 1 tablet by mouth 2 (two) times daily with a meal.  Dispense: 180 tablet; Refill: 0  2. Primary hypertension Low salt diet - CBC with Differential/Platelet - CMP14+EGFR - lisinopril-hydrochlorothiazide (ZESTORETIC) 20-25 MG tablet; Take 2 tablets by mouth daily. (Needs to be seen before next refill)  Dispense: 180 tablet; Refill: 1 - amLODipine (NORVASC) 5 MG tablet; TAKE 1 TABLET BY MOUTH ONCE DAILY (Needs to be seen before next refill)  Dispense: 90 tablet; Refill: 1  3. Hyperlipidemia, unspecified hyperlipidemia type Low fat diet - Lipid panel - atorvastatin (LIPITOR) 40 MG tablet; Take 1  tablet (40 mg total) by mouth daily.  Dispense: 90 tablet; Refill: 1 - fenofibrate (TRICOR) 145 MG tablet; TAKE 1 TABLET BY MOUTH ONCE DAILY (Needs to be seen before next refill)  Dispense: 90 tablet; Refill: 1  4. Prostate cancer ([place]) Labs pending - tamsulosin (FLOMAX) 0.4 MG CAPS capsule; Take 1 capsule (0.4 mg total) by mouth daily.  Dispense: 90 capsule; Refill: 1  5. BMI 32.0-32.9,adult Discussed diet and exercise for person with BMI >25 Will recheck weight in 3-6 months    Labs pending Health Maintenance  reviewed Diet and exercise encouraged  Follow up plan: 3 months   Mary-Margaret Durrell Barajas, FNP  

## 2021-06-03 LAB — CMP14+EGFR
ALT: 21 IU/L (ref 0–44)
AST: 20 IU/L (ref 0–40)
Albumin/Globulin Ratio: 2.2 (ref 1.2–2.2)
Albumin: 4.3 g/dL (ref 3.8–4.8)
Alkaline Phosphatase: 53 IU/L (ref 44–121)
BUN/Creatinine Ratio: 10 (ref 10–24)
BUN: 10 mg/dL (ref 8–27)
Bilirubin Total: 0.4 mg/dL (ref 0.0–1.2)
CO2: 27 mmol/L (ref 20–29)
Calcium: 9.8 mg/dL (ref 8.6–10.2)
Chloride: 100 mmol/L (ref 96–106)
Creatinine, Ser: 1.05 mg/dL (ref 0.76–1.27)
Globulin, Total: 2 g/dL (ref 1.5–4.5)
Glucose: 117 mg/dL — ABNORMAL HIGH (ref 70–99)
Potassium: 4.1 mmol/L (ref 3.5–5.2)
Sodium: 140 mmol/L (ref 134–144)
Total Protein: 6.3 g/dL (ref 6.0–8.5)
eGFR: 78 mL/min/{1.73_m2} (ref >59)

## 2021-06-03 LAB — CBC WITH DIFFERENTIAL/PLATELET
Basophils Absolute: 0 10*3/uL (ref 0.0–0.2)
Basos: 0 %
EOS (ABSOLUTE): 0.1 10*3/uL (ref 0.0–0.4)
Eos: 1 %
Hematocrit: 41.3 % (ref 37.5–51.0)
Hemoglobin: 14.1 g/dL (ref 13.0–17.7)
Immature Grans (Abs): 0 10*3/uL (ref 0.0–0.1)
Immature Granulocytes: 1 %
Lymphocytes Absolute: 3.1 10*3/uL (ref 0.7–3.1)
Lymphs: 53 %
MCH: 31.2 pg (ref 26.6–33.0)
MCHC: 34.1 g/dL (ref 31.5–35.7)
MCV: 91 fL (ref 79–97)
Monocytes Absolute: 0.7 10*3/uL (ref 0.1–0.9)
Monocytes: 12 %
Neutrophils Absolute: 2 10*3/uL (ref 1.4–7.0)
Neutrophils: 33 %
Platelets: 279 10*3/uL (ref 150–450)
RBC: 4.52 x10E6/uL (ref 4.14–5.80)
RDW: 13 % (ref 11.6–15.4)
WBC: 5.9 10*3/uL (ref 3.4–10.8)

## 2021-06-03 LAB — LIPID PANEL
Chol/HDL Ratio: 2.9 ratio (ref 0.0–5.0)
Cholesterol, Total: 106 mg/dL (ref 100–199)
HDL: 36 mg/dL — ABNORMAL LOW (ref >39)
LDL Chol Calc (NIH): 43 mg/dL (ref 0–99)
Triglycerides: 160 mg/dL — ABNORMAL HIGH (ref 0–149)
VLDL Cholesterol Cal: 27 mg/dL (ref 5–40)

## 2021-06-23 ENCOUNTER — Telehealth: Payer: Self-pay | Admitting: Nurse Practitioner

## 2021-06-23 NOTE — Telephone Encounter (Signed)
Patient aware of results.

## 2021-08-17 ENCOUNTER — Other Ambulatory Visit: Payer: Self-pay | Admitting: Nurse Practitioner

## 2021-08-17 DIAGNOSIS — C61 Malignant neoplasm of prostate: Secondary | ICD-10-CM

## 2021-08-31 ENCOUNTER — Ambulatory Visit: Payer: Managed Care, Other (non HMO) | Admitting: Nurse Practitioner

## 2021-08-31 ENCOUNTER — Encounter: Payer: Self-pay | Admitting: Nurse Practitioner

## 2021-08-31 NOTE — Progress Notes (Unsigned)
° °  Subjective:    Patient ID: Brandon Elliott, male    DOB: 1955-08-29, 66 y.o.   MRN: [id number]   Chief Complaint: medical management of chronic issues     HPI:  [name] is a 66 y.o. who identifies as a male who was assigned male at birth.   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 Primary hypertension No c/o chest pain, sob or headache. Doe snot check blood pressure at home BP Readings from Last 3 Encounters:  06/02/21 135/79  04/14/21 (!) 153/83  11/24/20 (!) 141/81     2. Mixed hyperlipidemia Doe snt really watch diet abnd does no deedicated exercie. Lab Results  Component Value Date   CHOL 106 06/02/2021   HDL 36 (L) 06/02/2021   LDLCALC 43 06/02/2021   TRIG 160 (H) 06/02/2021   CHOLHDL 2.9 06/02/2021   The ASCVD Risk score (Arnett DK, et al., 2019) failed to calculate for the following reasons:   The valid total cholesterol range is 130 to 320 mg/dL   3. Type 2 diabetes mellitus without complication, with long-term current use of insulin (HCC) ***  4. Prostate cancer (HCC) No problems voiding  5. BMI 32.0-32.9,adult No recent weight changes    New complaints: ***  No Known Allergies Outpatient Encounter Medications as of 08/31/2021  Medication Sig   amLODipine (NORVASC) 5 MG tablet TAKE 1 TABLET BY MOUTH ONCE DAILY (Needs to be seen before next refill)   atorvastatin (LIPITOR) 40 MG tablet Take 1 tablet (40 mg total) by mouth daily.   fenofibrate (TRICOR) 145 MG tablet TAKE 1 TABLET BY MOUTH ONCE DAILY (Needs to be seen before next refill)   lisinopril-hydrochlorothiazide (ZESTORETIC) 20-25 MG tablet Take 2 tablets by mouth daily. (Needs to be seen before next refill)   naproxen (NAPROSYN) 500 MG tablet Take 1 tablet (500 mg total) by mouth 2 (two) times daily with a meal.   sitaGLIPtin-metformin (JANUMET) 50-1000 MG tablet  Take 1 tablet by mouth 2 (two) times daily with a meal.   tamsulosin (FLOMAX) 0.4 MG CAPS capsule Take 1 capsule by mouth once daily   No facility-administered encounter medications on file as of 08/31/2021.    Past Surgical History:  Procedure Laterality Date   RADIOACTIVE SEED IMPLANT N/A 10/16/2012   Procedure: RADIOACTIVE SEED IMPLANT;  Surgeon: Bernestine Amass, MD;  Location: [facility];  Service: Urology;  Laterality: N/A;  7 seeds implanted no seeds found in bladder     Family History  Problem Relation Age of Onset   Stroke Mother    Cancer Father        prostate      Controlled substance contract: ***     Review of Systems     Objective:   Physical Exam        Assessment & Plan:

## 2021-10-03 ENCOUNTER — Other Ambulatory Visit: Payer: Self-pay | Admitting: Nurse Practitioner

## 2021-10-03 DIAGNOSIS — E119 Type 2 diabetes mellitus without complications: Secondary | ICD-10-CM

## 2021-10-05 NOTE — Telephone Encounter (Signed)
Last office visit 06/02/21 ?Upcoming appointment 10/12/21 ?Last refill 06/02/21, #180, no refills ?

## 2021-10-12 ENCOUNTER — Ambulatory Visit: Payer: Managed Care, Other (non HMO) | Admitting: Nurse Practitioner

## 2021-10-12 ENCOUNTER — Encounter: Payer: Self-pay | Admitting: Nurse Practitioner

## 2021-10-12 VITALS — BP 128/67 | HR 88 | Temp 97.9°F | Ht 66.0 in | Wt 176.0 lb

## 2021-10-12 DIAGNOSIS — N50812 Left testicular pain: Secondary | ICD-10-CM

## 2021-10-12 DIAGNOSIS — R300 Dysuria: Secondary | ICD-10-CM

## 2021-10-12 LAB — URINALYSIS, COMPLETE
Bilirubin, UA: NEGATIVE
Glucose, UA: NEGATIVE
Ketones, UA: NEGATIVE
Leukocytes,UA: NEGATIVE
Nitrite, UA: NEGATIVE
Protein,UA: NEGATIVE
RBC, UA: NEGATIVE
Specific Gravity, UA: 1.02 (ref 1.005–1.030)
Urobilinogen, Ur: 0.2 mg/dL (ref 0.2–1.0)
pH, UA: 6 (ref 5.0–7.5)

## 2021-10-12 LAB — MICROSCOPIC EXAMINATION
Bacteria, UA: NONE SEEN
Epithelial Cells (non renal): NONE SEEN /hpf (ref 0–10)
RBC, Urine: NONE SEEN /hpf (ref 0–2)
Renal Epithel, UA: NONE SEEN /hpf
WBC, UA: NONE SEEN /hpf (ref 0–5)

## 2021-10-12 NOTE — Progress Notes (Signed)
? ?  Subjective:  ? ? Patient ID: EVERSON MOTT, male    DOB: March 24, 1956, 66 y.o.   MRN: [id number] ? ? ?Chief Complaint: groin pain ? ?HPI ?Patient was seen in urgent care with groin pain on 10/02/21. He was dx with orchitis and was given doxycycline. The groin pain is better. He denies any urinary , freq, dysuria or hesistancy.  ? ? ? ?Review of Systems  ?Constitutional:  Negative for diaphoresis.  ?Eyes:  Negative for pain.  ?Respiratory:  Negative for shortness of breath.   ?Cardiovascular:  Negative for chest pain, palpitations and leg swelling.  ?Gastrointestinal:  Negative for abdominal pain.  ?Endocrine: Negative for polydipsia.  ?Skin:  Negative for rash.  ?Neurological:  Negative for dizziness, weakness and headaches.  ?Hematological:  Does not bruise/bleed easily.  ?All other systems reviewed and are negative. ? ?   ?Objective:  ? Physical Exam ?Vitals reviewed.  ?Constitutional:   ?   Appearance: Normal appearance.  ?Cardiovascular:  ?   Rate and Rhythm: Normal rate and regular rhythm.  ?   Heart sounds: Normal heart sounds.  ?Pulmonary:  ?   Effort: Pulmonary effort is normal.  ?   Breath sounds: Normal breath sounds.  ?Genitourinary: ?   Comments: Left testicle larger then right ?Mild left testicular tenderness on palpation. ?Left testicle feels nodular ?Skin: ?   General: Skin is warm and dry.  ?Neurological:  ?   General: No focal deficit present.  ?   Mental Status: He is alert and oriented to person, place, and time.  ? ? ?BP 128/67   Pulse 88   Temp 97.9 ?F (36.6 ?C) (Temporal)   Ht '5\' 6"'$  (1.676 m)   Wt 176 lb (79.8 kg)   SpO2 98%   BMI 28.41 kg/m?  ? ? ? ?   ?Assessment & Plan:  ?JAELIN FACKLER in today with chief complaint of No chief complaint on file. ? ? ?1. Dysuria ?Results pending ?- Urinalysis, Complete ? ?2. Pain in left testicle ?Will talk after u/s is complete ?- US Scrotum; Future ?- Urine Culture ? ?Hospital records reviewed ? ?The above assessment and management plan was discussed  with the patient. The patient verbalized understanding of and has agreed to the management plan. Patient is aware to call the clinic if symptoms persist or worsen. Patient is aware when to return to the clinic for a follow-up visit. Patient educated on when it is appropriate to go to the emergency department.  ? ?Mary-Margaret Hassell Done, FNP ? ? ? ?

## 2021-10-14 LAB — URINE CULTURE: Organism ID, Bacteria: NO GROWTH

## 2021-10-15 ENCOUNTER — Other Ambulatory Visit: Payer: Self-pay | Admitting: Nurse Practitioner

## 2021-10-15 DIAGNOSIS — N50812 Left testicular pain: Secondary | ICD-10-CM

## 2021-10-15 NOTE — Progress Notes (Signed)
Order u/s scrotom with do[pler ? ?

## 2021-10-16 ENCOUNTER — Ambulatory Visit: Payer: Managed Care, Other (non HMO) | Admitting: Nurse Practitioner

## 2021-10-20 ENCOUNTER — Telehealth: Payer: Self-pay | Admitting: Nurse Practitioner

## 2021-10-21 NOTE — Telephone Encounter (Signed)
Orders signed.

## 2021-10-21 NOTE — Telephone Encounter (Signed)
Printed on [employer] to sing  ?

## 2021-12-24 ENCOUNTER — Encounter: Payer: Self-pay | Admitting: Urology

## 2021-12-24 ENCOUNTER — Ambulatory Visit: Payer: Managed Care, Other (non HMO) | Admitting: Urology

## 2021-12-24 VITALS — BP 138/76 | HR 80

## 2021-12-24 DIAGNOSIS — N453 Epididymo-orchitis: Secondary | ICD-10-CM | POA: Diagnosis not present

## 2021-12-24 DIAGNOSIS — Z8546 Personal history of malignant neoplasm of prostate: Secondary | ICD-10-CM | POA: Diagnosis not present

## 2021-12-24 DIAGNOSIS — N430 Encysted hydrocele: Secondary | ICD-10-CM

## 2021-12-24 LAB — URINALYSIS, ROUTINE W REFLEX MICROSCOPIC
Bilirubin, UA: NEGATIVE
Glucose, UA: NEGATIVE
Ketones, UA: NEGATIVE
Leukocytes,UA: NEGATIVE
Nitrite, UA: NEGATIVE
RBC, UA: NEGATIVE
Specific Gravity, UA: 1.02 (ref 1.005–1.030)
Urobilinogen, Ur: 1 mg/dL (ref 0.2–1.0)
pH, UA: 6.5 (ref 5.0–7.5)

## 2021-12-24 MED ORDER — LEVOFLOXACIN 500 MG PO TABS: 500.0000 mg | ORAL_TABLET | Freq: Every day | ORAL | 0 refills | Status: DC

## 2021-12-24 NOTE — Progress Notes (Signed)
Subjective: 1. Orchitis and epididymitis   2. Encysted hydrocele   3. History of prostate cancer      Consult requested by [facility] ER  Brandon Elliott is a 66 yo male a 3 month history of left testicular pain and swelling.  He was most recently seen in the [facility] ED and has been treated with antibiotics 3-4x.  He had e. Coli on culture on 10/20/21 and 12/06/21 at his last ER visit.  He was given doxycycline in March but the bacteria was resistent.  He was given levaquin for 7 days on 10/20/21 and was last treated with levaquin for only 5 days based on sensitivities and that helped it but the swelling hasn't resolved.  He had a negative culture on 10/12/21.  He has a history of prostate cancer treated with a seed implant in 2013 by Dr. Risa Grill.  His PSA's have been good with the last, <01 in 5/22.  A scrotal US on 10/20/21 showed a complex left hydrocele with epididymitis.   He is voiding with some urgency.  He has no nocturia.  He has occasional dysuria.  He has had no hematuria.   He has a history of BPH with BOO and is on tamsulosin.  His UA is clear today.   ROS:  Review of Systems  Musculoskeletal:  Positive for joint pain.  All other systems reviewed and are negative.   No Known Allergies  Past Medical History:  Diagnosis Date   Bronchitis    DX 10-03-2012--  ON ANTIBIOTICS   DM (diabetes mellitus), type 2 ([place])    History of BPH    W/ BOO   History of gastric ulcer 2010   Hyperlipidemia    Hypertension    Prostate cancer ([place]) 06/29/12   Adenocarcinoma,gleason=3+4=7,& 3+3=6,PSA=4.23,volume=38cc    Past Surgical History:  Procedure Laterality Date   RADIOACTIVE SEED IMPLANT N/A 10/16/2012   Procedure: RADIOACTIVE SEED IMPLANT;  Surgeon: Bernestine Amass, MD;  Location: [facility];  Service: Urology;  Laterality: N/A;  21 seeds implanted no seeds found in bladder     Social History   Socioeconomic History   Marital status: Single    Spouse name: Not on file   Number of  children: 4   Years of education: Not on file   Highest education level: Not on file  Occupational History   Occupation: truck Education administrator: [employer]  Tobacco Use   Smoking status: Every Day    Packs/day: 0.50    Years: 30.00    Total pack years: 15.00    Types: Cigarettes   Smokeless tobacco: Never  Substance and Sexual Activity   Alcohol use: Yes    Alcohol/week: 35.0 standard drinks of alcohol    Types: 35 Cans of beer per week    Comment: 5-6 cans beer per day   Drug use: No   Sexual activity: Yes  Other Topics Concern   Not on file  Social History Narrative   Not on file   Social Determinants of Health   Financial Resource Strain: Not on file  Food Insecurity: Not on file  Transportation Needs: Not on file  Physical Activity: Not on file  Stress: Not on file  Social Connections: Not on file  Intimate Partner Violence: Not on file    Family History  Problem Relation Age of Onset   Stroke Mother    Cancer Father        prostate    Anti-infectives: Anti-infectives (  From admission, onward)    Start     Dose/Rate Route Frequency Ordered Stop   12/24/21 0000  levofloxacin (LEVAQUIN) 500 MG tablet        500 mg Oral Daily 12/24/21 1111         Current Outpatient Medications  Medication Sig Dispense Refill   amLODipine (NORVASC) 5 MG tablet TAKE 1 TABLET BY MOUTH ONCE DAILY (Needs to be seen before next refill) 90 tablet 1   atorvastatin (LIPITOR) 40 MG tablet Take 1 tablet (40 mg total) by mouth daily. 90 tablet 1   fenofibrate (TRICOR) 145 MG tablet TAKE 1 TABLET BY MOUTH ONCE DAILY (Needs to be seen before next refill) 90 tablet 1   JANUMET 50-1000 MG tablet TAKE 1 TABLET BY MOUTH TWICE DAILY WITH A MEAL 180 tablet 0   levofloxacin (LEVAQUIN) 500 MG tablet Take 1 tablet (500 mg total) by mouth daily. 30 tablet 0   lisinopril-hydrochlorothiazide (ZESTORETIC) 20-25 MG tablet Take 2 tablets by mouth daily. (Needs to be seen before next refill)  180 tablet 1   naproxen (NAPROSYN) 500 MG tablet Take 1 tablet (500 mg total) by mouth 2 (two) times daily with a meal. 60 tablet 1   tamsulosin (FLOMAX) 0.4 MG CAPS capsule Take 1 capsule by mouth once daily 90 capsule 0   No current facility-administered medications for this visit.     Objective: Vital signs in last 24 hours: BP 138/76   Pulse 80   Intake/Output from previous day: No intake/output data recorded. Intake/Output this shift: '@IOTHISSHIFT'$ @   Physical Exam Vitals reviewed.  Constitutional:      Appearance: Normal appearance.  Cardiovascular:     Rate and Rhythm: Normal rate and regular rhythm.     Heart sounds: Normal heart sounds.  Pulmonary:     Effort: Pulmonary effort is normal. No respiratory distress.     Breath sounds: Normal breath sounds.  Abdominal:     Palpations: Abdomen is soft.     Hernia: No hernia is present.  Genitourinary:    Comments: Uncirc phallus with adequate meatus. Scrotum enlarged on left with soft hydrocele and tender induration and enlargement of the tail of the left epididymis.  The testicle is normal. Right testicle and epididymis are normal.  Musculoskeletal:        General: No swelling. Normal range of motion.  Skin:    General: Skin is warm and dry.  Neurological:     General: No focal deficit present.     Mental Status: He is alert and oriented to person, place, and time.  Psychiatric:        Mood and Affect: Mood normal.        Behavior: Behavior normal.     Lab Results:  Results for orders placed or performed in visit on 12/24/21 (from the past 24 hour(s))  Urinalysis, Routine w reflex microscopic     Status: Abnormal   Collection Time: 12/24/21 11:12 AM  Result Value Ref Range   Specific Gravity, UA 1.020 1.005 - 1.030   pH, UA 6.5 5.0 - 7.5   Color, UA Amber (A) Yellow   Appearance Ur Clear Clear   Leukocytes,UA Negative Negative   Protein,UA Trace (A) Negative/Trace   Glucose, UA Negative Negative    Ketones, UA Negative Negative   RBC, UA Negative Negative   Bilirubin, UA Negative Negative   Urobilinogen, Ur 1.0 0.2 - 1.0 mg/dL   Nitrite, UA Negative Negative   Microscopic Examination Comment  Narrative   Performed at:  [facility] [address], [place], [place]  [id number] Lab Director: [place], Phone:  [phone]    BMET No results for input(s): "NA", "K", "CL", "CO2", "GLUCOSE", "BUN", "CREATININE", "CALCIUM" in the last 72 hours. PT/INR No results for input(s): "LABPROT", "INR" in the last 72 hours. ABG No results for input(s): "PHART", "HCO3" in the last 72 hours.  Invalid input(s): "PCO2", "PO2" Labs and cultures from [facility] reviewed and noted above.  Studies/Results: No results found. Scrotal US from [facility] report reviewed.  Films wouldn't load.  ER notes reviewed.   Assessment/Plan: Left epididymo-orchitis with a reactive hydrocele.  He has been partially treated so I will give him the levaquin '500mg'$  for another month and have him return in 4 weeks.  If the problem persists, he might need surgical therapy.  History of prostate cancer with prior seeds.  His PSA's have been undetectible and he is due for his annual next month.  BPH with BOO. He is voiding well on tamsulosin.   Meds ordered this encounter  Medications   levofloxacin (LEVAQUIN) 500 MG tablet    Sig: Take 1 tablet (500 mg total) by mouth daily.    Dispense:  30 tablet    Refill:  0     Orders Placed This Encounter  Procedures   Urinalysis, Routine w reflex microscopic     Return in about 4 weeks (around 01/21/2022).    CC: Dr. Chevis Pretty.      Irine Seal 12/25/2021 [phone]

## 2022-01-13 ENCOUNTER — Other Ambulatory Visit: Payer: Self-pay | Admitting: Nurse Practitioner

## 2022-01-13 DIAGNOSIS — E119 Type 2 diabetes mellitus without complications: Secondary | ICD-10-CM

## 2022-01-13 DIAGNOSIS — E785 Hyperlipidemia, unspecified: Secondary | ICD-10-CM

## 2022-02-04 ENCOUNTER — Ambulatory Visit: Payer: Managed Care, Other (non HMO) | Admitting: Urology

## 2022-02-15 ENCOUNTER — Other Ambulatory Visit: Payer: Self-pay | Admitting: Nurse Practitioner

## 2022-02-15 ENCOUNTER — Encounter: Payer: Self-pay | Admitting: Nurse Practitioner

## 2022-02-15 ENCOUNTER — Ambulatory Visit: Payer: Managed Care, Other (non HMO) | Admitting: Nurse Practitioner

## 2022-02-15 VITALS — BP 114/67 | HR 75 | Temp 97.7°F | Resp 20 | Ht 66.0 in | Wt 179.0 lb

## 2022-02-15 DIAGNOSIS — E782 Mixed hyperlipidemia: Secondary | ICD-10-CM

## 2022-02-15 DIAGNOSIS — Z6832 Body mass index (BMI) 32.0-32.9, adult: Secondary | ICD-10-CM

## 2022-02-15 DIAGNOSIS — E119 Type 2 diabetes mellitus without complications: Secondary | ICD-10-CM | POA: Diagnosis not present

## 2022-02-15 DIAGNOSIS — C61 Malignant neoplasm of prostate: Secondary | ICD-10-CM

## 2022-02-15 DIAGNOSIS — I10 Essential (primary) hypertension: Secondary | ICD-10-CM | POA: Diagnosis not present

## 2022-02-15 LAB — BAYER DCA HB A1C WAIVED: HB A1C (BAYER DCA - WAIVED): 6.7 % — ABNORMAL HIGH (ref 4.8–5.6)

## 2022-02-15 MED ORDER — TAMSULOSIN HCL 0.4 MG PO CAPS: 0.4000 mg | ORAL_CAPSULE | Freq: Every day | ORAL | 1 refills | Status: DC

## 2022-02-15 MED ORDER — ATORVASTATIN CALCIUM 40 MG PO TABS: 40.0000 mg | ORAL_TABLET | Freq: Every day | ORAL | 1 refills | Status: DC

## 2022-02-15 MED ORDER — LISINOPRIL-HYDROCHLOROTHIAZIDE 20-25 MG PO TABS: 2.0000 | ORAL_TABLET | Freq: Every day | ORAL | 1 refills | Status: DC

## 2022-02-15 MED ORDER — AMLODIPINE BESYLATE 5 MG PO TABS: ORAL_TABLET | ORAL | 1 refills | Status: DC

## 2022-02-15 MED ORDER — FENOFIBRATE 145 MG PO TABS: ORAL_TABLET | ORAL | 1 refills | Status: DC

## 2022-02-15 NOTE — Patient Instructions (Signed)
Diabetes Mellitus and Foot Care Foot care is an important part of your health, especially when you have diabetes. Diabetes may cause you to have problems because of poor blood flow (circulation) to your feet and legs, which can cause your skin to: Become thinner and drier. Break more easily. Heal more slowly. Peel and crack. You may also have nerve damage (neuropathy) in your legs and feet, causing decreased feeling in them. This means that you may not notice minor injuries to your feet that could lead to more serious problems. Noticing and addressing any potential problems early is the best way to prevent future foot problems. How to care for your feet Foot hygiene  Wash your feet daily with warm water and mild soap. Do not use hot water. Then, pat your feet and the areas between your toes until they are completely dry. Do not soak your feet as this can dry your skin. Trim your toenails straight across. Do not dig under them or around the cuticle. File the edges of your nails with an emery board or nail file. Apply a moisturizing lotion or petroleum jelly to the skin on your feet and to dry, brittle toenails. Use lotion that does not contain alcohol and is unscented. Do not apply lotion between your toes. Shoes and socks Wear clean socks or stockings every day. Make sure they are not too tight. Do not wear knee-high stockings since they may decrease blood flow to your legs. Wear shoes that fit properly and have enough cushioning. Always look in your shoes before you put them on to be sure there are no objects inside. To break in new shoes, wear them for just a few hours a day. This prevents injuries on your feet. Wounds, scrapes, corns, and calluses  Check your feet daily for blisters, cuts, bruises, sores, and redness. If you cannot see the bottom of your feet, use a mirror or ask someone for help. Do not cut corns or calluses or try to remove them with medicine. If you find a minor scrape,  cut, or break in the skin on your feet, keep it and the skin around it clean and dry. You may clean these areas with mild soap and water. Do not clean the area with peroxide, alcohol, or iodine. If you have a wound, scrape, corn, or callus on your foot, look at it several times a day to make sure it is healing and not infected. Check for: Redness, swelling, or pain. Fluid or blood. Warmth. Pus or a bad smell. General tips Do not cross your legs. This may decrease blood flow to your feet. Do not use heating pads or hot water bottles on your feet. They may burn your skin. If you have lost feeling in your feet or legs, you may not know this is happening until it is too late. Protect your feet from hot and cold by wearing shoes, such as at the beach or on hot pavement. Schedule a complete foot exam at least once a year (annually) or more often if you have foot problems. Report any cuts, sores, or bruises to your health care provider immediately. Where to find more information American Diabetes Association: www.diabetes.org Association of Diabetes Care & Education Specialists: www.diabeteseducator.org Contact a health care provider if: You have a medical condition that increases your risk of infection and you have any cuts, sores, or bruises on your feet. You have an injury that is not healing. You have redness on your legs or feet. You   feel burning or tingling in your legs or feet. You have pain or cramps in your legs and feet. Your legs or feet are numb. Your feet always feel cold. You have pain around any toenails. Get help right away if: You have a wound, scrape, corn, or callus on your foot and: You have pain, swelling, or redness that gets worse. You have fluid or blood coming from the wound, scrape, corn, or callus. Your wound, scrape, corn, or callus feels warm to the touch. You have pus or a bad smell coming from the wound, scrape, corn, or callus. You have a fever. You have a red  line going up your leg. Summary Check your feet every day for blisters, cuts, bruises, sores, and redness. Apply a moisturizing lotion or petroleum jelly to the skin on your feet and to dry, brittle toenails. Wear shoes that fit properly and have enough cushioning. If you have foot problems, report any cuts, sores, or bruises to your health care provider immediately. Schedule a complete foot exam at least once a year (annually) or more often if you have foot problems. This information is not intended to replace advice given to you by your health care provider. Make sure you discuss any questions you have with your health care provider. Document Revised: 01/24/2020 Document Reviewed: 01/24/2020 Elsevier Patient Education  2023 Elsevier Inc.  

## 2022-02-15 NOTE — Progress Notes (Signed)
Subjective:    Patient ID: Brandon Elliott, male    DOB: 1955-12-08, 66 y.o.   MRN: [id number]   Chief Complaint: medical management of chronic issues     HPI:  Brandon Elliott is a 66 y.o. who identifies as a male who was assigned male at birth.   Social history: Lives with: girlfriend Work history: Mining engineer   Comes in today for follow up of the following chronic medical issues:  1. Primary hypertension No c/o chest pain, sob or headache. Does not check blood pressure at home. BP Readings from Last 3 Encounters:  12/24/21 138/76  10/12/21 128/67  06/02/21 135/79     2. Type 2 diabetes mellitus without complication, without long-term current use of insulin ([place]) Dos not check blood sugars at home very often. Lab Results  Component Value Date   HGBA1C 6.4 (H) 06/02/2021     3. Mixed hyperlipidemia Does try to watch diet and does no dedicated exercise Lab Results  Component Value Date   CHOL 106 06/02/2021   HDL 36 (L) 06/02/2021   LDLCALC 43 06/02/2021   TRIG 160 (H) 06/02/2021   CHOLHDL 2.9 06/02/2021   The ASCVD Risk score (Arnett DK, et al., 2019) failed to calculate for the following reasons:   The valid total cholesterol range is 130 to 320 mg/dL   4. Prostate cancer ([place]) No voiding issues Lab Results  Component Value Date   PSA1 <0.1 11/24/2020   PSA1 <0.1 10/22/2019      5. BMI 32.0-32.9,adult No recent weight changes Wt Readings from Last 3 Encounters:  10/12/21 176 lb (79.8 kg)  06/02/21 178 lb (80.7 kg)  04/14/21 176 lb (79.8 kg)   BMI Readings from Last 3 Encounters:  10/12/21 28.41 kg/m  06/02/21 28.73 kg/m  04/14/21 28.41 kg/m      New complaints: None today  No Known Allergies Outpatient Encounter Medications as of 02/15/2022  Medication Sig   amLODipine (NORVASC) 5 MG tablet TAKE 1 TABLET BY MOUTH ONCE DAILY (Needs to be seen before next refill)   atorvastatin (LIPITOR) 40 MG tablet Take 1 tablet (40 mg total) by  mouth daily. (NEEDS TO BE SEEN BEFORE NEXT REFILL)   fenofibrate (TRICOR) 145 MG tablet TAKE 1 TABLET BY MOUTH ONCE DAILY (Needs to be seen before next refill)   levofloxacin (LEVAQUIN) 500 MG tablet Take 1 tablet (500 mg total) by mouth daily.   lisinopril-hydrochlorothiazide (ZESTORETIC) 20-25 MG tablet Take 2 tablets by mouth daily. (Needs to be seen before next refill)   naproxen (NAPROSYN) 500 MG tablet Take 1 tablet (500 mg total) by mouth 2 (two) times daily with a meal.   sitaGLIPtin-metformin (JANUMET) 50-1000 MG tablet Take 1 tablet by mouth 2 (two) times daily with a meal. (NEEDS TO BE SEEN BEFORE NEXT REFILL)   tamsulosin (FLOMAX) 0.4 MG CAPS capsule Take 1 capsule by mouth once daily   No facility-administered encounter medications on file as of 02/15/2022.    Past Surgical History:  Procedure Laterality Date   RADIOACTIVE SEED IMPLANT N/A 10/16/2012   Procedure: RADIOACTIVE SEED IMPLANT;  Surgeon: Bernestine Amass, MD;  Location: [facility];  Service: Urology;  Laterality: N/A;  67 seeds implanted no seeds found in bladder     Family History  Problem Relation Age of Onset   Stroke Mother    Cancer Father        prostate      Controlled substance contract: n/a  Review of Systems  Constitutional:  Negative for diaphoresis.  Eyes:  Negative for pain.  Respiratory:  Negative for shortness of breath.   Cardiovascular:  Negative for chest pain, palpitations and leg swelling.  Gastrointestinal:  Negative for abdominal pain.  Endocrine: Negative for polydipsia.  Skin:  Negative for rash.  Neurological:  Negative for dizziness, weakness and headaches.  Hematological:  Does not bruise/bleed easily.  All other systems reviewed and are negative.      Objective:   Physical Exam Vitals and nursing note reviewed.  Constitutional:      Appearance: Normal appearance. He is well-developed.  HENT:     Head: Normocephalic.     Nose: Nose normal.      Mouth/Throat:     Mouth: Mucous membranes are moist.     Pharynx: Oropharynx is clear.  Eyes:     Pupils: Pupils are equal, round, and reactive to light.  Neck:     Thyroid: No thyroid mass or thyromegaly.     Vascular: No carotid bruit or JVD.     Trachea: Phonation normal.  Cardiovascular:     Rate and Rhythm: Normal rate and regular rhythm.  Pulmonary:     Effort: Pulmonary effort is normal. No respiratory distress.     Breath sounds: Normal breath sounds.  Abdominal:     General: Bowel sounds are normal.     Palpations: Abdomen is soft.     Tenderness: There is no abdominal tenderness.  Musculoskeletal:        General: Normal range of motion.     Cervical back: Normal range of motion and neck supple.  Lymphadenopathy:     Cervical: No cervical adenopathy.  Skin:    General: Skin is warm and dry.  Neurological:     Mental Status: He is alert and oriented to person, place, and time.  Psychiatric:        Behavior: Behavior normal.        Thought Content: Thought content normal.        Judgment: Judgment normal.    BP 114/67   Pulse 75   Temp 97.7 F (36.5 C) (Temporal)   Resp 20   Ht _0  (1.676 m)   Wt 179 lb (81.2 kg)   SpO2 98%   BMI 28.89 kg/m    HGBA1c 6.7%      Assessment & Plan:   Brandon Elliott comes in today with chief complaint of Medical Management of Chronic Issues   Diagnosis and orders addressed:  1. Primary hypertension Low sodium diet - CBC with Differential/Platelet - CMP14+EGFR - lisinopril-hydrochlorothiazide (ZESTORETIC) 20-25 MG tablet; Take 2 tablets by mouth daily. (Needs to be seen before next refill)  Dispense: 180 tablet; Refill: 1 - amLODipine (NORVASC) 5 MG tablet; TAKE 1 TABLET BY MOUTH ONCE DAILY (Needs to be seen before next refill)  Dispense: 90 tablet; Refill: 1  2. Type 2 diabetes mellitus without complication, without long-term current use of insulin ([facility]) Continue to watch carbs in diet - Bayer DCA Hb A1c  Waived  3. Mixed hyperlipidemia Low fat diet - Lipid panel - atorvastatin (LIPITOR) 40 MG tablet; Take 1 tablet (40 mg total) by mouth daily. (NEEDS TO BE SEEN BEFORE NEXT REFILL)  Dispense: 90 tablet; Refill: 1 - fenofibrate (TRICOR) 145 MG tablet; TAKE 1 TABLET BY MOUTH ONCE DAILY (Needs to be seen before next refill)  Dispense: 90 tablet; Refill: 1  4. Prostate cancer [facility]) Report any trouble voiding - tamsulosin (  FLOMAX) 0.4 MG CAPS capsule; Take 1 capsule (0.4 mg total) by mouth daily.  Dispense: 90 capsule; Refill: 1  5. BMI 32.0-32.9,adult Discussed diet and exercise for person with BMI >25 Will recheck weight in 3-6 months    Labs pending Health Maintenance reviewed Diet and exercise encouraged  Follow up plan: 6 months   Mary-Margaret Hassell Done, FNP

## 2022-02-16 LAB — CBC WITH DIFFERENTIAL/PLATELET
Basophils Absolute: 0 10*3/uL (ref 0.0–0.2)
Basos: 1 %
EOS (ABSOLUTE): 0.1 10*3/uL (ref 0.0–0.4)
Eos: 1 %
Hematocrit: 41.8 % (ref 37.5–51.0)
Hemoglobin: 14.5 g/dL (ref 13.0–17.7)
Immature Grans (Abs): 0 10*3/uL (ref 0.0–0.1)
Immature Granulocytes: 0 %
Lymphocytes Absolute: 3.1 10*3/uL (ref 0.7–3.1)
Lymphs: 46 %
MCH: 31.2 pg (ref 26.6–33.0)
MCHC: 34.7 g/dL (ref 31.5–35.7)
MCV: 90 fL (ref 79–97)
Monocytes Absolute: 0.7 10*3/uL (ref 0.1–0.9)
Monocytes: 11 %
Neutrophils Absolute: 2.6 10*3/uL (ref 1.4–7.0)
Neutrophils: 41 %
Platelets: 273 10*3/uL (ref 150–450)
RBC: 4.65 x10E6/uL (ref 4.14–5.80)
RDW: 13.6 % (ref 11.6–15.4)
WBC: 6.5 10*3/uL (ref 3.4–10.8)

## 2022-02-16 LAB — LIPID PANEL
Chol/HDL Ratio: 2.5 ratio (ref 0.0–5.0)
Cholesterol, Total: 104 mg/dL (ref 100–199)
HDL: 42 mg/dL (ref >39)
LDL Chol Calc (NIH): 45 mg/dL (ref 0–99)
Triglycerides: 83 mg/dL (ref 0–149)
VLDL Cholesterol Cal: 17 mg/dL (ref 5–40)

## 2022-02-16 LAB — CMP14+EGFR
ALT: 20 IU/L (ref 0–44)
AST: 20 IU/L (ref 0–40)
Albumin/Globulin Ratio: 1.7 (ref 1.2–2.2)
Albumin: 4.5 g/dL (ref 3.9–4.9)
Alkaline Phosphatase: 66 IU/L (ref 44–121)
BUN/Creatinine Ratio: 12 (ref 10–24)
BUN: 13 mg/dL (ref 8–27)
Bilirubin Total: 0.4 mg/dL (ref 0.0–1.2)
CO2: 27 mmol/L (ref 20–29)
Calcium: 10.1 mg/dL (ref 8.6–10.2)
Chloride: 99 mmol/L (ref 96–106)
Creatinine, Ser: 1.1 mg/dL (ref 0.76–1.27)
Globulin, Total: 2.6 g/dL (ref 1.5–4.5)
Glucose: 83 mg/dL (ref 70–99)
Potassium: 4.8 mmol/L (ref 3.5–5.2)
Sodium: 140 mmol/L (ref 134–144)
Total Protein: 7.1 g/dL (ref 6.0–8.5)
eGFR: 73 mL/min/{1.73_m2} (ref >59)

## 2022-03-20 ENCOUNTER — Other Ambulatory Visit: Payer: Self-pay | Admitting: Nurse Practitioner

## 2022-03-20 DIAGNOSIS — E119 Type 2 diabetes mellitus without complications: Secondary | ICD-10-CM

## 2022-04-07 ENCOUNTER — Telehealth: Payer: Managed Care, Other (non HMO)

## 2022-04-09 ENCOUNTER — Encounter: Payer: Self-pay | Admitting: Family Medicine

## 2022-04-09 ENCOUNTER — Ambulatory Visit (INDEPENDENT_AMBULATORY_CARE_PROVIDER_SITE_OTHER): Payer: Managed Care, Other (non HMO) | Admitting: Family Medicine

## 2022-04-09 DIAGNOSIS — U071 COVID-19: Secondary | ICD-10-CM

## 2022-04-09 NOTE — Progress Notes (Signed)
Virtual Visit via telephone Note Due to COVID-19 pandemic this visit was conducted virtually. This visit type was conducted due to national recommendations for restrictions regarding the COVID-19 Pandemic (e.g. social distancing, sheltering in place) in an effort to limit this patient's exposure and mitigate transmission in our community. All issues noted in this document were discussed and addressed.  A physical exam was not performed with this format.   I connected with Brandon Elliott on 04/09/2022 at [facility] by telephone and verified that I am speaking with the correct person using two identifiers. Brandon Elliott is currently located at home and patient is currently with them during visit. The provider, Monia Pouch, FNP is located in their office at time of visit.  I discussed the limitations, risks, security and privacy concerns of performing an evaluation and management service by virtual visit and the availability of in person appointments. I also discussed with the patient that there may be a patient responsible charge related to this service. The patient expressed understanding and agreed to proceed.  Subjective:  Patient ID: Brandon Elliott, male    DOB: 11/10/1955, 66 y.o.   MRN: [id number]  Chief Complaint:  Covid Positive   HPI: Brandon Elliott is a 66 y.o. male presenting on 04/09/2022 for Covid Positive   Pt reports he had COVID and was treated with antiviral therapy. States he is scheduled to go back to work on Monday but needs a COVID test prior to returning. States he feels much better. Has a few aches at times but is doing well.      Relevant past medical, surgical, family, and social history reviewed and updated as indicated.  Allergies and medications reviewed and updated.   Past Medical History:  Diagnosis Date   Bronchitis    DX 10-03-2012--  ON ANTIBIOTICS   DM (diabetes mellitus), type 2 ([place])    History of BPH    W/ BOO   History of gastric ulcer 2010    Hyperlipidemia    Hypertension    Prostate cancer ([place]) 06/29/12   Adenocarcinoma,gleason=3+4=7,& 3+3=6,PSA=4.23,volume=38cc    Past Surgical History:  Procedure Laterality Date   RADIOACTIVE SEED IMPLANT N/A 10/16/2012   Procedure: RADIOACTIVE SEED IMPLANT;  Surgeon: Bernestine Amass, MD;  Location: [facility];  Service: Urology;  Laterality: N/A;  28 seeds implanted no seeds found in bladder     Social History   Socioeconomic History   Marital status: Single    Spouse name: Not on file   Number of children: 4   Years of education: Not on file   Highest education level: Not on file  Occupational History   Occupation: truck Education administrator: [employer]  Tobacco Use   Smoking status: Every Day    Packs/day: 0.50    Years: 30.00    Total pack years: 15.00    Types: Cigarettes   Smokeless tobacco: Never  Substance and Sexual Activity   Alcohol use: Yes    Alcohol/week: 35.0 standard drinks of alcohol    Types: 35 Cans of beer per week    Comment: 5-6 cans beer per day   Drug use: No   Sexual activity: Yes  Other Topics Concern   Not on file  Social History Narrative   Not on file   Social Determinants of Health   Financial Resource Strain: Not on file  Food Insecurity: Not on file  Transportation Needs: Not on file  Physical Activity: Not  on file  Stress: Not on file  Social Connections: Not on file  Intimate Partner Violence: Not on file    Outpatient Encounter Medications as of 04/09/2022  Medication Sig   amLODipine (NORVASC) 5 MG tablet Take 1 tablet by mouth once daily   amLODipine (NORVASC) 5 MG tablet TAKE 1 TABLET BY MOUTH ONCE DAILY (Needs to be seen before next refill)   atorvastatin (LIPITOR) 40 MG tablet Take 1 tablet (40 mg total) by mouth daily. (NEEDS TO BE SEEN BEFORE NEXT REFILL)   fenofibrate (TRICOR) 145 MG tablet TAKE 1 TABLET BY MOUTH ONCE DAILY (Needs to be seen before next refill)   JANUMET 50-1000 MG tablet TAKE 1  TABLET BY MOUTH TWICE DAILY WITH  A  MEAL   lisinopril-hydrochlorothiazide (ZESTORETIC) 20-25 MG tablet Take 2 tablets by mouth daily. (Needs to be seen before next refill)   tamsulosin (FLOMAX) 0.4 MG CAPS capsule Take 1 capsule (0.4 mg total) by mouth daily.   No facility-administered encounter medications on file as of 04/09/2022.    No Known Allergies  Review of Systems  Constitutional:  Negative for activity change, appetite change, chills, diaphoresis, fatigue, fever and unexpected weight change.  HENT: Negative.    Eyes: Negative.   Respiratory:  Negative for cough, chest tightness and shortness of breath.   Cardiovascular:  Negative for chest pain, palpitations and leg swelling.  Gastrointestinal:  Negative for abdominal pain, blood in stool, constipation, diarrhea, nausea and vomiting.  Endocrine: Negative.   Genitourinary:  Negative for decreased urine volume, difficulty urinating, dysuria, frequency and urgency.  Musculoskeletal:  Positive for arthralgias and myalgias.  Skin: Negative.   Allergic/Immunologic: Negative.   Neurological:  Negative for dizziness, weakness and headaches.  Hematological: Negative.   Psychiatric/Behavioral:  Negative for confusion, hallucinations, sleep disturbance and suicidal ideas.   All other systems reviewed and are negative.        Observations/Objective: No vital signs or physical exam, this was a virtual health encounter.  Pt alert and oriented, answers all questions appropriately, and able to speak in full sentences.    Assessment and Plan: Brandon Elliott was seen today for covid positive.  Diagnoses and all orders for this visit:  COVID Recently treated for COVID and needs testing prior to returning to work.  -     Novel Coronavirus, NAA ([facility])     Follow Up Instructions: Return if symptoms worsen or fail to improve.    I discussed the assessment and treatment plan with the patient. The patient was provided an opportunity to  ask questions and all were answered. The patient agreed with the plan and demonstrated an understanding of the instructions.   The patient was advised to call back or seek an in-person evaluation if the symptoms worsen or if the condition fails to improve as anticipated.  The above assessment and management plan was discussed with the patient. The patient verbalized understanding of and has agreed to the management plan. Patient is aware to call the clinic if they develop any new symptoms or if symptoms persist or worsen. Patient is aware when to return to the clinic for a follow-up visit. Patient educated on when it is appropriate to go to the emergency department.    I provided 11 minutes of time during this telephone encounter.   Monia Pouch, FNP-C [facility] [address] [place], [place] [address] [phone] 04/09/2022

## 2022-04-12 LAB — NOVEL CORONAVIRUS, NAA: SARS-CoV-2, NAA: NOT DETECTED

## 2022-04-27 ENCOUNTER — Other Ambulatory Visit: Payer: Self-pay | Admitting: Nurse Practitioner

## 2022-04-27 DIAGNOSIS — E119 Type 2 diabetes mellitus without complications: Secondary | ICD-10-CM

## 2022-07-15 ENCOUNTER — Telehealth: Payer: Self-pay

## 2022-07-15 NOTE — Telephone Encounter (Signed)
Transition Care Management Unsuccessful Follow-up Telephone Call  Date of discharge and from where:  [facility]  Attempts:  1st Attempt  Reason for unsuccessful TCM follow-up call:  Unable to leave message- mailbox full

## 2022-07-22 NOTE — Telephone Encounter (Signed)
Transition Care Management Unsuccessful Follow-up Telephone Call  Date of discharge and from where:  07/14/2022 [facility] ED  Attempts:  2nd Attempt  Reason for unsuccessful TCM follow-up call:  Voice mail full

## 2022-07-22 NOTE — Telephone Encounter (Signed)
Transition Care Management Follow-up Telephone Call Date of discharge and from where: 07/14/22 [facility] ED How have you been since you were released from the hospital? Patient states he is doing well  Any questions or concerns? No  Items Reviewed: Did the pt receive and understand the discharge instructions provided? Yes  Medications obtained and verified? Yes  Other? Yes  Any new allergies since your discharge? No  Dietary orders reviewed? Yes Do you have support at home? Yes   Home Care and Equipment/Supplies: Were home health services ordered? not applicable If so, what is the name of the agency? na  Has the agency set up a time to come to the patient's home? not applicable Were any new equipment or medical supplies ordered?  No What is the name of the medical supply agency? na Were you able to get the supplies/equipment? not applicable Do you have any questions related to the use of the equipment or supplies? No  Functional Questionnaire: (I = Independent and D = Dependent) ADLs: i  Bathing/Dressing- i  Meal Prep- i  Eating- i  Maintaining continence- i  Transferring/Ambulation- i  Managing Meds- i  Follow up appointments reviewed:  PCP Hospital f/u appt confirmed? Yes  Scheduled to see Hassell Done on 08/17/22 for chronic care follow up. Patient will CB if OV needed sooner - if symptoms start again.  [facility] f/u appt confirmed? No   Are transportation arrangements needed? No  If their condition worsens, is the pt aware to call PCP or go to the Emergency Dept.? Yes Was the patient provided with contact information for the PCP's office or ED? Yes Was to pt encouraged to call back with questions or concerns? Yes

## 2022-08-02 ENCOUNTER — Other Ambulatory Visit: Payer: Self-pay | Admitting: Nurse Practitioner

## 2022-08-02 DIAGNOSIS — E119 Type 2 diabetes mellitus without complications: Secondary | ICD-10-CM

## 2022-08-17 ENCOUNTER — Encounter: Payer: Self-pay | Admitting: Nurse Practitioner

## 2022-08-17 ENCOUNTER — Ambulatory Visit (INDEPENDENT_AMBULATORY_CARE_PROVIDER_SITE_OTHER): Payer: Managed Care, Other (non HMO) | Admitting: Nurse Practitioner

## 2022-08-17 VITALS — BP 139/72 | HR 80 | Temp 97.8°F | Resp 20 | Ht 66.0 in | Wt 181.0 lb

## 2022-08-17 DIAGNOSIS — I10 Essential (primary) hypertension: Secondary | ICD-10-CM | POA: Diagnosis not present

## 2022-08-17 DIAGNOSIS — Z6832 Body mass index (BMI) 32.0-32.9, adult: Secondary | ICD-10-CM

## 2022-08-17 DIAGNOSIS — E782 Mixed hyperlipidemia: Secondary | ICD-10-CM

## 2022-08-17 DIAGNOSIS — E119 Type 2 diabetes mellitus without complications: Secondary | ICD-10-CM

## 2022-08-17 DIAGNOSIS — C61 Malignant neoplasm of prostate: Secondary | ICD-10-CM

## 2022-08-17 MED ORDER — AMLODIPINE BESYLATE 5 MG PO TABS: ORAL_TABLET | ORAL | 1 refills | Status: DC

## 2022-08-17 MED ORDER — ATORVASTATIN CALCIUM 40 MG PO TABS: 40.0000 mg | ORAL_TABLET | Freq: Every day | ORAL | 1 refills | Status: DC

## 2022-08-17 MED ORDER — LISINOPRIL-HYDROCHLOROTHIAZIDE 20-25 MG PO TABS: 2.0000 | ORAL_TABLET | Freq: Every day | ORAL | 1 refills | Status: DC

## 2022-08-17 MED ORDER — TAMSULOSIN HCL 0.4 MG PO CAPS: 0.4000 mg | ORAL_CAPSULE | Freq: Every day | ORAL | 1 refills | Status: DC

## 2022-08-17 MED ORDER — FENOFIBRATE 145 MG PO TABS: ORAL_TABLET | ORAL | 1 refills | Status: DC

## 2022-08-17 MED ORDER — JANUMET 50-1000 MG PO TABS: 1.0000 | ORAL_TABLET | Freq: Two times a day (BID) | ORAL | 5 refills | Status: DC

## 2022-08-17 NOTE — Patient Instructions (Signed)

## 2022-08-17 NOTE — Progress Notes (Signed)
Subjective:    Patient ID: Brandon Elliott, male    DOB: 1955/08/07, 67 y.o.   MRN: [id number]   Chief Complaint: medical management of chronic issues     HPI:  Brandon Elliott is a 67 y.o. who identifies as a male who was assigned male at birth.   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 Primary hypertension No c/o chest pain, sob or headache. Does not check blood pressure at home BP Readings from Last 3 Encounters:  02/15/22 114/67  12/24/21 138/76  10/12/21 128/67     2. Mixed hyperlipidemia Doe snot really watch diet. Doe no dedicated exercise. Lab Results  Component Value Date   CHOL 104 02/15/2022   HDL 42 02/15/2022   LDLCALC 45 02/15/2022   TRIG 83 02/15/2022   CHOLHDL 2.5 02/15/2022     3. Type 2 diabetes mellitus without complication, with long-term current use of insulin (HCC) Does not check blood sugars everyday. Denies any symptoms of low blood sugars Lab Results  Component Value Date   HGBA1C 6.7 (H) 02/15/2022     4. Prostate cancer ([place]) No voiding issues. He has frequent UTI  5. BMI 32.0-32.9,adult No weight changes Wt Readings from Last 3 Encounters:  08/17/22 181 lb (82.1 kg)  02/15/22 179 lb (81.2 kg)  10/12/21 176 lb (79.8 kg)   BMI Readings from Last 3 Encounters:  08/17/22 29.21 kg/m  02/15/22 28.89 kg/m  10/12/21 28.41 kg/m      New complaints: None today  No Known Allergies Outpatient Encounter Medications as of 08/17/2022  Medication Sig   amLODipine (NORVASC) 5 MG tablet Take 1 tablet by mouth once daily   amLODipine (NORVASC) 5 MG tablet TAKE 1 TABLET BY MOUTH ONCE DAILY (Needs to be seen before next refill)   atorvastatin (LIPITOR) 40 MG tablet Take 1 tablet (40 mg total) by mouth daily. (NEEDS TO BE SEEN BEFORE NEXT REFILL)   fenofibrate (TRICOR) 145 MG tablet TAKE 1  TABLET BY MOUTH ONCE DAILY (Needs to be seen before next refill)   lisinopril-hydrochlorothiazide (ZESTORETIC) 20-25 MG tablet Take 2 tablets by mouth daily. (Needs to be seen before next refill)   sitaGLIPtin-metformin (JANUMET) 50-1000 MG tablet Take 1 tablet by mouth 2 (two) times daily with a meal. (NEEDS TO BE SEEN BEFORE NEXT REFILL)   tamsulosin (FLOMAX) 0.4 MG CAPS capsule Take 1 capsule (0.4 mg total) by mouth daily.   No facility-administered encounter medications on file as of 08/17/2022.    Past Surgical History:  Procedure Laterality Date   RADIOACTIVE SEED IMPLANT N/A 10/16/2012   Procedure: RADIOACTIVE SEED IMPLANT;  Surgeon: Bernestine Amass, MD;  Location: [facility];  Service: Urology;  Laterality: N/A;  14 seeds implanted no seeds found in bladder     Family History  Problem Relation Age of Onset   Stroke Mother    Cancer Father        prostate      Controlled substance contract: n/a     Review of Systems  Constitutional:  Negative for diaphoresis.  Eyes:  Negative for pain.  Respiratory:  Negative for shortness of breath.   Cardiovascular:  Negative for chest pain, palpitations and leg swelling.  Gastrointestinal:  Negative for abdominal pain.  Endocrine: Negative for polydipsia.  Skin:  Negative for rash.  Neurological:  Negative for dizziness, weakness and headaches.  Hematological:  Does not bruise/bleed easily.  All other systems reviewed and are negative.      Objective:   Physical Exam Vitals and nursing note reviewed.  Constitutional:      Appearance: Normal appearance. He is well-developed.  HENT:     Head: Normocephalic.     Nose: Nose normal.     Mouth/Throat:     Mouth: Mucous membranes are moist.     Pharynx: Oropharynx is clear.  Eyes:     Pupils: Pupils are equal, round, and reactive to light.  Neck:     Thyroid: No thyroid mass or thyromegaly.     Vascular: No carotid bruit or JVD.     Trachea: Phonation  normal.  Cardiovascular:     Rate and Rhythm: Normal rate and regular rhythm.  Pulmonary:     Effort: Pulmonary effort is normal. No respiratory distress.     Breath sounds: Normal breath sounds.  Abdominal:     General: Bowel sounds are normal.     Palpations: Abdomen is soft.     Tenderness: There is no abdominal tenderness.  Musculoskeletal:        General: Normal range of motion.     Cervical back: Normal range of motion and neck supple.  Lymphadenopathy:     Cervical: No cervical adenopathy.  Skin:    General: Skin is warm and dry.  Neurological:     Mental Status: He is alert and oriented to person, place, and time.  Psychiatric:        Behavior: Behavior normal.        Thought Content: Thought content normal.        Judgment: Judgment normal.    BP 139/72   Pulse 80   Temp 97.8 F (36.6 C) (Temporal)   Resp 20   Ht '5\' 6"'$  (1.676 m)   Wt 181 lb (82.1 kg)   SpO2 99%   BMI 29.21 kg/m    Hgba1c 7.4%     Assessment & Plan:   Brandon Elliott in today with chief complaint of Medical Management of Chronic Issues   1. Primary hypertension Low sodium diet - CBC with Differential/Platelet - CMP14+EGFR - amLODipine (NORVASC) 5 MG tablet; Take 1 tablet by mouth once daily  Dispense: 90 tablet; Refill: 1 - amLODipine (NORVASC) 5 MG tablet; TAKE 1 TABLET BY MOUTH ONCE DAILY (Needs to be seen before next refill)  Dispense: 90 tablet; Refill: 1 - lisinopril-hydrochlorothiazide (ZESTORETIC) 20-25 MG tablet; Take 2 tablets by mouth daily. (Needs to be seen before next refill)  Dispense: 180 tablet; Refill: 1  2. Mixed hyperlipidemia Low fat diet - Lipid panel - atorvastatin (LIPITOR) 40 MG tablet; Take 1 tablet (40 mg total) by mouth daily. (NEEDS TO BE SEEN BEFORE NEXT REFILL)  Dispense: 90 tablet; Refill: 1 - fenofibrate (TRICOR) 145 MG tablet; TAKE 1 TABLET BY MOUTH ONCE DAILY (Needs to be seen before next refill)  Dispense: 90 tablet; Refill: 1  3. Type 2 diabetes  mellitus without complication, without long-term current use of insulin (HCC) - sitaGLIPtin-metformin (JANUMET) 50-1000 MG tablet; Take 1 tablet by mouth 2 (two) times daily with a meal. (NEEDS TO BE SEEN BEFORE NEXT REFILL)  Dispense: 60 tablet; Refill: 5 Continue to watch carbs I diet - Bayer DCA Hb A1c Waived - Microalbumin / creatinine urine ratio  4. Prostate cancer ([place]) Labs pending - PSA, total and free - tamsulosin (FLOMAX) 0.4 MG CAPS  capsule; Take 1 capsule (0.4 mg total) by mouth daily.  Dispense: 90 capsule; Refill: 1  5. BMI 32.0-32.9,adult Discussed diet and exercise for person with BMI >25 Will recheck weight in 3-6 months     The above assessment and management plan was discussed with the patient. The patient verbalized understanding of and has agreed to the management plan. Patient is aware to call the clinic if symptoms persist or worsen. Patient is aware when to return to the clinic for a follow-up visit. Patient educated on when it is appropriate to go to the emergency department.   Mary-Margaret Hassell Done, FNP

## 2022-08-18 LAB — CMP14+EGFR
ALT: 19 IU/L (ref 0–44)
AST: 21 IU/L (ref 0–40)
Albumin/Globulin Ratio: 2.1 (ref 1.2–2.2)
Albumin: 4.4 g/dL (ref 3.9–4.9)
Alkaline Phosphatase: 58 IU/L (ref 44–121)
BUN/Creatinine Ratio: 13 (ref 10–24)
BUN: 15 mg/dL (ref 8–27)
Bilirubin Total: 0.3 mg/dL (ref 0.0–1.2)
CO2: 26 mmol/L (ref 20–29)
Calcium: 9.9 mg/dL (ref 8.6–10.2)
Chloride: 101 mmol/L (ref 96–106)
Creatinine, Ser: 1.16 mg/dL (ref 0.76–1.27)
Globulin, Total: 2.1 g/dL (ref 1.5–4.5)
Glucose: 113 mg/dL — ABNORMAL HIGH (ref 70–99)
Potassium: 4.7 mmol/L (ref 3.5–5.2)
Sodium: 142 mmol/L (ref 134–144)
Total Protein: 6.5 g/dL (ref 6.0–8.5)
eGFR: 69 mL/min/{1.73_m2} (ref >59)

## 2022-08-18 LAB — CBC WITH DIFFERENTIAL/PLATELET
Basophils Absolute: 0 10*3/uL (ref 0.0–0.2)
Basos: 1 %
EOS (ABSOLUTE): 0.1 10*3/uL (ref 0.0–0.4)
Eos: 1 %
Hematocrit: 37.9 % (ref 37.5–51.0)
Hemoglobin: 12.6 g/dL — ABNORMAL LOW (ref 13.0–17.7)
Immature Grans (Abs): 0 10*3/uL (ref 0.0–0.1)
Immature Granulocytes: 0 %
Lymphocytes Absolute: 3.1 10*3/uL (ref 0.7–3.1)
Lymphs: 48 %
MCH: 30.8 pg (ref 26.6–33.0)
MCHC: 33.2 g/dL (ref 31.5–35.7)
MCV: 93 fL (ref 79–97)
Monocytes Absolute: 0.6 10*3/uL (ref 0.1–0.9)
Monocytes: 9 %
Neutrophils Absolute: 2.7 10*3/uL (ref 1.4–7.0)
Neutrophils: 41 %
Platelets: 223 10*3/uL (ref 150–450)
RBC: 4.09 x10E6/uL — ABNORMAL LOW (ref 4.14–5.80)
RDW: 13.8 % (ref 11.6–15.4)
WBC: 6.5 10*3/uL (ref 3.4–10.8)

## 2022-08-18 LAB — PSA, TOTAL AND FREE
PSA, Free: <0.02 ng/mL
Prostate Specific Ag, Serum: <0.1 ng/mL (ref 0.0–4.0)

## 2022-08-18 LAB — LIPID PANEL
Chol/HDL Ratio: 2.8 ratio (ref 0.0–5.0)
Cholesterol, Total: 99 mg/dL — ABNORMAL LOW (ref 100–199)
HDL: 35 mg/dL — ABNORMAL LOW (ref >39)
LDL Chol Calc (NIH): 40 mg/dL (ref 0–99)
Triglycerides: 140 mg/dL (ref 0–149)
VLDL Cholesterol Cal: 24 mg/dL (ref 5–40)

## 2022-08-18 LAB — BAYER DCA HB A1C WAIVED: HB A1C (BAYER DCA - WAIVED): 7.4 % — ABNORMAL HIGH (ref 4.8–5.6)

## 2022-08-19 LAB — MICROALBUMIN / CREATININE URINE RATIO
Creatinine, Urine: 127.8 mg/dL
Microalb/Creat Ratio: 41 mg/g creat — ABNORMAL HIGH (ref 0–29)
Microalbumin, Urine: 52.9 ug/mL

## 2022-10-15 ENCOUNTER — Encounter: Payer: Self-pay | Admitting: Nurse Practitioner

## 2022-11-19 ENCOUNTER — Ambulatory Visit: Payer: Managed Care, Other (non HMO) | Admitting: Nurse Practitioner

## 2022-11-24 ENCOUNTER — Encounter (HOSPITAL_COMMUNITY): Payer: Self-pay | Admitting: Emergency Medicine

## 2022-11-24 ENCOUNTER — Emergency Department (HOSPITAL_COMMUNITY)
Admission: EM | Admit: 2022-11-24 | Discharge: 2022-11-24 | Disposition: A | Discharge status: Home / Self Care | Payer: Managed Care, Other (non HMO) | Attending: Emergency Medicine | Admitting: Emergency Medicine

## 2022-11-24 ENCOUNTER — Other Ambulatory Visit: Payer: Self-pay

## 2022-11-24 ENCOUNTER — Emergency Department (HOSPITAL_COMMUNITY): Payer: Managed Care, Other (non HMO)

## 2022-11-24 DIAGNOSIS — N451 Epididymitis: Secondary | ICD-10-CM | POA: Diagnosis not present

## 2022-11-24 DIAGNOSIS — Z79899 Other long term (current) drug therapy: Secondary | ICD-10-CM | POA: Insufficient documentation

## 2022-11-24 DIAGNOSIS — N50811 Right testicular pain: Secondary | ICD-10-CM | POA: Diagnosis present

## 2022-11-24 LAB — URINALYSIS, ROUTINE W REFLEX MICROSCOPIC
Bilirubin Urine: NEGATIVE
Glucose, UA: NEGATIVE mg/dL
Hgb urine dipstick: NEGATIVE
Ketones, ur: NEGATIVE mg/dL
Leukocytes,Ua: NEGATIVE
Nitrite: POSITIVE — AB
Protein, ur: NEGATIVE mg/dL
Specific Gravity, Urine: 1.014 (ref 1.005–1.030)
pH: 6 (ref 5.0–8.0)

## 2022-11-24 LAB — CBG MONITORING, ED: Glucose-Capillary: 127 mg/dL — ABNORMAL HIGH (ref 70–99)

## 2022-11-24 MED ORDER — CEFTRIAXONE SODIUM 500 MG IJ SOLR
500.0000 mg | Freq: Once | INTRAMUSCULAR | Status: AC
  Administered 2022-11-24: 500 mg via INTRAMUSCULAR
  Filled 2022-11-24: qty 500

## 2022-11-24 MED ORDER — DOXYCYCLINE HYCLATE 100 MG PO CAPS: 100.0000 mg | ORAL_CAPSULE | Freq: Two times a day (BID) | ORAL | 0 refills | Status: DC

## 2022-11-24 MED ORDER — LIDOCAINE HCL (PF) 1 % IJ SOLN
INTRAMUSCULAR | Status: AC
  Administered 2022-11-24: 1 mL
  Filled 2022-11-24: qty 2

## 2022-11-24 MED ORDER — ACETAMINOPHEN 325 MG PO TABS
650.0000 mg | ORAL_TABLET | Freq: Once | ORAL | Status: AC
  Administered 2022-11-24: 650 mg via ORAL
  Filled 2022-11-24: qty 2

## 2022-11-24 MED ORDER — DOXYCYCLINE HYCLATE 100 MG PO TABS
100.0000 mg | ORAL_TABLET | Freq: Once | ORAL | Status: AC
  Administered 2022-11-24: 100 mg via ORAL
  Filled 2022-11-24: qty 1

## 2022-11-24 NOTE — ED Provider Triage Note (Signed)
Emergency Medicine Provider Triage Evaluation Note  EDEL KOVEN , a 67 y.o. male  was evaluated in triage.  Pt complains of testicular pain and swelling.  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  Pain and swelling primarily in the right testicle.  Denies abnormal penile discharge or bleeding.  States the pain is constant and hurts all the time.  Denies fever and chills.  Review of Systems  Positive: See above Negative: See above  Physical Exam  BP 118/78 (BP Location: Right Arm)   Pulse (!) 105   Temp 99.1 F (37.3 C) (Oral)   Resp 17   SpO2 94%  Gen:   Awake, no distress   Resp:  Normal effort  MSK:   Moves extremities without difficulty  Other:    Medical Decision Making  Medically screening exam initiated at 2:12 PM.  Appropriate orders placed.  Cortrell Angelo Marczak was informed that the remainder of the evaluation will be completed by another provider, this initial triage assessment does not replace that evaluation, and the importance of remaining in the ED until their evaluation is complete.  Work up started   Gareth Eagle, PA-C 11/24/22 1413

## 2022-11-24 NOTE — ED Triage Notes (Signed)
Pt c/o right side testicle pain/swelling x 3 days. Able to urinate but with pain. Seen at [facility] Monday. Was given abx but not helping

## 2022-11-24 NOTE — ED Provider Notes (Signed)
[place] EMERGENCY DEPARTMENT AT [facility] Provider Note   CSN: [id number] Arrival date & time: 11/24/22  1223     History  Chief Complaint  Patient presents with   Groin Swelling   HPI Brandon Elliott is a 67 y.o. male  presenting with testicular pain and swelling.  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  Pain and swelling primarily in the right testicle.  Denies abnormal penile discharge or bleeding.  States the pain is constant and hurts all the time.  Denies fever and chills.   HPI     Home Medications Prior to Admission medications   Medication Sig Start Date End Date Taking? Authorizing Provider  doxycycline (VIBRAMYCIN) 100 MG capsule Take 1 capsule (100 mg total) by mouth 2 (two) times daily. 11/24/22  Yes Gareth Eagle, PA-C  amLODipine (NORVASC) 5 MG tablet Take 1 tablet by mouth once daily 08/17/22   Daphine Deutscher, Mary-Margaret, FNP  amLODipine (NORVASC) 5 MG tablet TAKE 1 TABLET BY MOUTH ONCE DAILY (Needs to be seen before next refill) 08/17/22   Daphine Deutscher, Mary-Margaret, FNP  atorvastatin (LIPITOR) 40 MG tablet Take 1 tablet (40 mg total) by mouth daily. (NEEDS TO BE SEEN BEFORE NEXT REFILL) 08/17/22   Bennie Pierini, FNP  fenofibrate (TRICOR) 145 MG tablet TAKE 1 TABLET BY MOUTH ONCE DAILY (Needs to be seen before next refill) 08/17/22   Bennie Pierini, FNP  lisinopril-hydrochlorothiazide (ZESTORETIC) 20-25 MG tablet Take 2 tablets by mouth daily. (Needs to be seen before next refill) 08/17/22   Bennie Pierini, FNP  sitaGLIPtin-metformin (JANUMET) 50-1000 MG tablet Take 1 tablet by mouth 2 (two) times daily with a meal. (NEEDS TO BE SEEN BEFORE NEXT REFILL) 08/17/22   Bennie Pierini, FNP  tamsulosin (FLOMAX) 0.4 MG CAPS capsule Take 1 capsule (0.4 mg total) by mouth daily. 08/17/22   Bennie Pierini, FNP      Allergies    Patient has no known allergies.    Review of Systems   Review of Systems  Genitourinary:  Positive for  scrotal swelling and testicular pain.    Physical Exam   Vitals:   11/24/22 1245  BP: 118/78  Pulse: (!) 105  Resp: 17  Temp: 99.1 F (37.3 C)  SpO2: 94%    CONSTITUTIONAL:  ell-appearing, NAD NEURO:  Alert and oriented x 3, CN 3-12 grossly intact EYES:  eyes equal and reactive ENT/NECK:  Supple, no stridor  CARDIO: appears well-perfused PULM:  No respiratory distress,  GI/GU:  non-distended, testicles appear grossly normal without edema, posterior right testicle tender to touch, penis appears normal without lesion or laceration or rash. MSK/SPINE:  No gross deformities, no edema, moves all extremities  SKIN:  no rash, atraumatic  *Additional and/or pertinent findings included in MDM below   ED Results / Procedures / Treatments   Labs (all labs ordered are listed, but only abnormal results are displayed) Labs Reviewed  URINALYSIS, ROUTINE W REFLEX MICROSCOPIC - Abnormal; Notable for the following components:      Result Value   Color, Urine AMBER (*)    Nitrite POSITIVE (*)    Bacteria, UA RARE (*)    All other components within normal limits  CBG MONITORING, ED - Abnormal; Notable for the following components:   Glucose-Capillary 127 (*)    All other components within normal limits    EKG None  Radiology US SCROTUM W/DOPPLER  Result Date: 11/24/2022 CLINICAL DATA:  RIGHT testicular pain for 2 weeks. EXAM: SCROTAL ULTRASOUND  DOPPLER ULTRASOUND OF THE TESTICLES TECHNIQUE: Complete ultrasound examination of the testicles, epididymis, and other scrotal structures was performed. Color and spectral Doppler ultrasound were also utilized to evaluate blood flow to the testicles. COMPARISON:  None Available. FINDINGS: Right testicle Measurements: 3.9 x 2.0 x 3.6 centimeters. Single echogenic focus is identified in the midpole region, unlikely significance. Left testicle Measurements: 3.1 x 1.5 x 2.1 centimeters. No mass or microlithiasis visualized. Right epididymis: Normal in  size and appearance. Increased vascularity noted on Doppler exam. Left epididymis:  Normal in size and appearance. Hydrocele:  None visualized. Varicocele:  None visualized. Pulsed Doppler interrogation of both testes demonstrates normal low resistance arterial and venous waveforms bilaterally. Note is made of thickened, hyperemic scrotal skin. IMPRESSION: 1. No evidence for testicular torsion or suspicious mass. 2. Increased blood flow in the RIGHT epididymis is consistent with epididymitis. Electronically Signed   By: Norva Pavlov M.D.   On: 11/24/2022 13:55    Procedures Procedures    Medications Ordered in ED Medications  acetaminophen (TYLENOL) tablet 650 mg (has no administration in time range)  cefTRIAXone (ROCEPHIN) injection 500 mg (has no administration in time range)  doxycycline (VIBRA-TABS) tablet 100 mg (has no administration in time range)  lidocaine (PF) (XYLOCAINE) 1 % injection (has no administration in time range)    ED Course/ Medical Decision Making/ A&P                             Medical Decision Making Risk OTC drugs. Prescription drug management.   67 year old well-appearing male presenting for testicular pain and swelling.  Exam notable for right posterior testicle tenderness otherwise reassuring.  This prompted further evaluation with ultrasound.  Fortunately ultrasound was negative for torsion but did reveal likely epididymitis of the right testicle.  This is likely the cause of his symptoms.  Treated with ceftriaxone and started him on doxycycline.  Advised him to follow-up with his PCP.  Discussed pertinent return precautions.  Vital stable at discharge.  Treat his pain with Tylenol.        Final Clinical Impression(s) / ED Diagnoses Final diagnoses:  Epididymitis    Rx / DC Orders ED Discharge Orders          Ordered    doxycycline (VIBRAMYCIN) 100 MG capsule  2 times daily        11/24/22 1524              Vaughan Browner 11/24/22 1524    Jacalyn Lefevre, MD 11/24/22 2138

## 2022-11-24 NOTE — Discharge Instructions (Signed)
Evaluation today revealed that you do have epididymitis.  Treatment for this is antibiotics.  I have sent doxycycline to your pharmacy.  Recommend you take it for the next 10 days.  Also recommending follow-up with your PCP.  If you have worsening testicular pain, abnormal penile discharge or any other concern please return the emergency department further evaluation.

## 2022-11-25 ENCOUNTER — Telehealth: Payer: Self-pay

## 2022-11-25 NOTE — Transitions of Care (Post Inpatient/ED Visit) (Signed)
   11/25/2022  Name: Brandon Elliott MRN: [id number] DOB: 1956-03-21  Today's TOC FU Call Status: Today's TOC FU Call Status:: Successful TOC FU Call Competed TOC FU Call Complete Date: 11/25/22  Transition Care Management Follow-up Telephone Call Date of Discharge: 11/24/22 [facility] Type of Discharge: Emergency Department Reason for ED Visit: Other: (epididymitis) How have you been since you were released from the hospital?: Better Any questions or concerns?: No  Items Reviewed: Did you receive and understand the discharge instructions provided?: Yes Medications obtained,verified, and reconciled?: Yes (Medications Reviewed) Any new allergies since your discharge?: No Dietary orders reviewed?: Yes Do you have support at home?: Yes People in Home: significant other  Medications Reviewed Today: Medications Reviewed Today     Reviewed by Karena Addison, LPN (Licensed Practical Nurse) on 11/25/22 at 1144  Med List Status: <None>   Medication Order Taking? Sig Documenting Provider Last Dose Status Informant  amLODipine (NORVASC) 5 MG tablet [id number] Yes Take 1 tablet by mouth once daily Daphine Deutscher, Mary-Margaret, FNP Taking Active   amLODipine (NORVASC) 5 MG tablet [id number] Yes TAKE 1 TABLET BY MOUTH ONCE DAILY (Needs to be seen before next refill) Bennie Pierini, FNP Taking Active   atorvastatin (LIPITOR) 40 MG tablet [id number] Yes Take 1 tablet (40 mg total) by mouth daily. (NEEDS TO BE SEEN BEFORE NEXT REFILL) Bennie Pierini, FNP Taking Active   doxycycline (VIBRAMYCIN) 100 MG capsule [id number] Yes Take 1 capsule (100 mg total) by mouth 2 (two) times daily. Gareth Eagle, PA-C Taking Active   fenofibrate (TRICOR) 145 MG tablet [id number] Yes TAKE 1 TABLET BY MOUTH ONCE DAILY (Needs to be seen before next refill) Bennie Pierini, FNP Taking Active   lisinopril-hydrochlorothiazide (ZESTORETIC) 20-25 MG tablet [id number] Yes Take 2 tablets by  mouth daily. (Needs to be seen before next refill) Bennie Pierini, FNP Taking Active   sitaGLIPtin-metformin (JANUMET) 50-1000 MG tablet [id number] Yes Take 1 tablet by mouth 2 (two) times daily with a meal. (NEEDS TO BE SEEN BEFORE NEXT REFILL) Bennie Pierini, FNP Taking Active   tamsulosin (FLOMAX) 0.4 MG CAPS capsule [id number] Yes Take 1 capsule (0.4 mg total) by mouth daily. Bennie Pierini, FNP Taking Active             Home Care and Equipment/Supplies: Were Home Health Services Ordered?: NA Any new equipment or medical supplies ordered?: NA  Functional Questionnaire: Do you need assistance with bathing/showering or dressing?: No Do you need assistance with meal preparation?: No Do you need assistance with eating?: No Do you have difficulty maintaining continence: No Do you need assistance with getting out of bed/getting out of a chair/moving?: No Do you have difficulty managing or taking your medications?: No  Follow up appointments reviewed: PCP Follow-up appointment confirmed?: Yes Date of PCP follow-up appointment?: 11/26/22 Follow-up Provider: [facility] Follow-up appointment confirmed?: NA Do you need transportation to your follow-up appointment?: No Do you understand care options if your condition(s) worsen?: Yes-patient verbalized understanding    SIGNATURE Karena Addison, LPN [facility] Nurse Health Advisor Direct Dial [phone]

## 2022-11-26 ENCOUNTER — Ambulatory Visit (INDEPENDENT_AMBULATORY_CARE_PROVIDER_SITE_OTHER): Payer: Managed Care, Other (non HMO) | Admitting: Nurse Practitioner

## 2022-11-26 ENCOUNTER — Encounter: Payer: Self-pay | Admitting: Nurse Practitioner

## 2022-11-26 VITALS — BP 118/67 | HR 79 | Ht 66.0 in | Wt 174.0 lb

## 2022-11-26 DIAGNOSIS — E785 Hyperlipidemia, unspecified: Secondary | ICD-10-CM

## 2022-11-26 DIAGNOSIS — E1169 Type 2 diabetes mellitus with other specified complication: Secondary | ICD-10-CM

## 2022-11-26 DIAGNOSIS — Z6832 Body mass index (BMI) 32.0-32.9, adult: Secondary | ICD-10-CM | POA: Diagnosis not present

## 2022-11-26 DIAGNOSIS — C61 Malignant neoplasm of prostate: Secondary | ICD-10-CM | POA: Diagnosis not present

## 2022-11-26 DIAGNOSIS — E119 Type 2 diabetes mellitus without complications: Secondary | ICD-10-CM

## 2022-11-26 DIAGNOSIS — I10 Essential (primary) hypertension: Secondary | ICD-10-CM

## 2022-11-26 DIAGNOSIS — Z7984 Long term (current) use of oral hypoglycemic drugs: Secondary | ICD-10-CM

## 2022-11-26 LAB — BAYER DCA HB A1C WAIVED: HB A1C (BAYER DCA - WAIVED): 6.7 % — ABNORMAL HIGH (ref 4.8–5.6)

## 2022-11-26 MED ORDER — LISINOPRIL-HYDROCHLOROTHIAZIDE 20-25 MG PO TABS: 2.0000 | ORAL_TABLET | Freq: Every day | ORAL | 1 refills | Status: DC

## 2022-11-26 MED ORDER — AMLODIPINE BESYLATE 5 MG PO TABS
ORAL_TABLET | ORAL | 1 refills | Status: DC
Start: 2022-11-26 — End: 2023-05-30

## 2022-11-26 MED ORDER — JANUMET 50-1000 MG PO TABS
1.0000 | ORAL_TABLET | Freq: Two times a day (BID) | ORAL | 5 refills | Status: DC
Start: 2022-11-26 — End: 2023-05-30

## 2022-11-26 MED ORDER — TAMSULOSIN HCL 0.4 MG PO CAPS
0.4000 mg | ORAL_CAPSULE | Freq: Every day | ORAL | 1 refills | Status: DC
Start: 2022-11-26 — End: 2023-05-30

## 2022-11-26 MED ORDER — ATORVASTATIN CALCIUM 40 MG PO TABS: 40.0000 mg | ORAL_TABLET | Freq: Every day | ORAL | 1 refills | Status: DC

## 2022-11-26 MED ORDER — FENOFIBRATE 145 MG PO TABS
ORAL_TABLET | ORAL | 1 refills | Status: DC
Start: 2022-11-26 — End: 2023-05-30

## 2022-11-26 NOTE — Progress Notes (Signed)
Subjective:    Patient ID: Brandon Elliott, male    DOB: 03/06/1956, 67 y.o.   MRN: [id number]   Chief Complaint: Medical Management of Chronic Issues, Hypertension, Hyperlipidemia, Diabetes, and Urinary Tract Infection (Currently on tx from [facility] per pt)    HPI:  Brandon Elliott is a 67 y.o. who identifies as a male who was assigned male at birth.   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 Type 2 diabetes mellitus without complication, without long-term current use of insulin (HCC) Fasting blood sugars run around 120's. He does not check them very often. Lab Results  Component Value Date   HGBA1C 7.4 (H) 08/17/2022     2. Primary hypertension No c/o chest pain, sob or headache. BP Readings from Last 3 Encounters:  11/26/22 118/67  11/24/22 124/68  08/17/22 139/72     3. Hyperlipidemia associated with type 2 diabetes mellitus (HCC) Does no really watch diet and is very active. Lab Results  Component Value Date   CHOL 99 (L) 08/17/2022   HDL 35 (L) 08/17/2022   LDLCALC 40 08/17/2022   TRIG 140 08/17/2022   CHOLHDL 2.8 08/17/2022     4. Prostate cancer [facility]) See urology- DR. Donnetta Hail- [place] office- needs referral back- having frequent UTI's  5. BMI 32.0-32.9,adult No recent weight changes Wt Readings from Last 3 Encounters:  11/26/22 174 lb (78.9 kg)  08/17/22 181 lb (82.1 kg)  02/15/22 179 lb (81.2 kg)   BMI Readings from Last 3 Encounters:  11/26/22 28.08 kg/m  08/17/22 29.21 kg/m  02/15/22 28.89 kg/m      New complaints: None today  No Known Allergies Outpatient Encounter Medications as of 11/26/2022  Medication Sig   amLODipine (NORVASC) 5 MG tablet Take 1 tablet by mouth once daily   amLODipine (NORVASC) 5 MG tablet TAKE 1 TABLET BY MOUTH ONCE DAILY (Needs to be seen before next refill)   atorvastatin (LIPITOR) 40 MG tablet Take 1 tablet  (40 mg total) by mouth daily. (NEEDS TO BE SEEN BEFORE NEXT REFILL)   doxycycline (VIBRAMYCIN) 100 MG capsule Take 1 capsule (100 mg total) by mouth 2 (two) times daily.   fenofibrate (TRICOR) 145 MG tablet TAKE 1 TABLET BY MOUTH ONCE DAILY (Needs to be seen before next refill)   lisinopril-hydrochlorothiazide (ZESTORETIC) 20-25 MG tablet Take 2 tablets by mouth daily. (Needs to be seen before next refill)   sitaGLIPtin-metformin (JANUMET) 50-1000 MG tablet Take 1 tablet by mouth 2 (two) times daily with a meal. (NEEDS TO BE SEEN BEFORE NEXT REFILL)   tamsulosin (FLOMAX) 0.4 MG CAPS capsule Take 1 capsule (0.4 mg total) by mouth daily.   No facility-administered encounter medications on file as of 11/26/2022.    Past Surgical History:  Procedure Laterality Date   RADIOACTIVE SEED IMPLANT N/A 10/16/2012   Procedure: RADIOACTIVE SEED IMPLANT;  Surgeon: Valetta Fuller, MD;  Location: [facility];  Service: Urology;  Laterality: N/A;  69 seeds implanted no seeds found in bladder     Family History  Problem Relation Age of Onset   Stroke Mother    Cancer Father        prostate      Controlled substance contract: n/a     Review of Systems  Constitutional:  Negative for diaphoresis.  Eyes:  Negative for pain.  Respiratory:  Negative for shortness of breath.  Cardiovascular:  Negative for chest pain, palpitations and leg swelling.  Gastrointestinal:  Negative for abdominal pain.  Endocrine: Negative for polydipsia.  Skin:  Negative for rash.  Neurological:  Negative for dizziness, weakness and headaches.  Hematological:  Does not bruise/bleed easily.  All other systems reviewed and are negative.      Objective:   Physical Exam Vitals and nursing note reviewed.  Constitutional:      Appearance: Normal appearance. He is well-developed.  HENT:     Head: Normocephalic.     Nose: Nose normal.     Mouth/Throat:     Mouth: Mucous membranes are moist.      Pharynx: Oropharynx is clear.  Eyes:     Pupils: Pupils are equal, round, and reactive to light.  Neck:     Thyroid: No thyroid mass or thyromegaly.     Vascular: No carotid bruit or JVD.     Trachea: Phonation normal.  Cardiovascular:     Rate and Rhythm: Normal rate and regular rhythm.  Pulmonary:     Effort: Pulmonary effort is normal. No respiratory distress.     Breath sounds: Normal breath sounds.  Abdominal:     General: Bowel sounds are normal.     Palpations: Abdomen is soft.     Tenderness: There is no abdominal tenderness.  Musculoskeletal:        General: Normal range of motion.     Cervical back: Normal range of motion and neck supple.  Lymphadenopathy:     Cervical: No cervical adenopathy.  Skin:    General: Skin is warm and dry.  Neurological:     Mental Status: He is alert and oriented to person, place, and time.  Psychiatric:        Behavior: Behavior normal.        Thought Content: Thought content normal.        Judgment: Judgment normal.     BP 118/67   Pulse 79   Ht 5\' 6"  (1.676 m)   Wt 174 lb (78.9 kg)   SpO2 95%   BMI 28.08 kg/m   HGBA1c 6.7%      Assessment & Plan:  Brandon Elliott comes in today with chief complaint of Medical Management of Chronic Issues, Hypertension, Hyperlipidemia, Diabetes, and Urinary Tract Infection (Currently on tx from [facility] per pt)   Diagnosis and orders addressed:  1. Type 2 diabetes mellitus without complication, without long-term current use of insulin (HCC) Continue  to wathc carbs in diet - Bayer DCA Hb A1c Waived - sitaGLIPtin-metformin (JANUMET) 50-1000 MG tablet; Take 1 tablet by mouth 2 (two) times daily with a meal. (NEEDS TO BE SEEN BEFORE NEXT REFILL)  Dispense: 60 tablet; Refill: 5  2. Primary hypertension Low sodium diet - amLODipine (NORVASC) 5 MG tablet; Take 1 tablet by mouth once daily  Dispense: 90 tablet; Refill: 1 - lisinopril-hydrochlorothiazide (ZESTORETIC) 20-25 MG tablet; Take 2 tablets  by mouth daily. (Needs to be seen before next refill)  Dispense: 180 tablet; Refill: 1  3. Hyperlipidemia associated with type 2 diabetes mellitus (HCC) Low fat diet - atorvastatin (LIPITOR) 40 MG tablet; Take 1 tablet (40 mg total) by mouth daily. (NEEDS TO BE SEEN BEFORE NEXT REFILL)  Dispense: 90 tablet; Refill: 1 - fenofibrate (TRICOR) 145 MG tablet; TAKE 1 TABLET BY MOUTH ONCE DAILY (Needs to be seen before next refill)  Dispense: 90 tablet; Refill: 1  4. Prostate cancer [facility]) Referral back to urology - tamsulosin (FLOMAX) 0.4  MG CAPS capsule; Take 1 capsule (0.4 mg total) by mouth daily.  Dispense: 90 capsule; Refill: 1 - Ambulatory referral to Urology  5. BMI 32.0-32.9,adult Discussed diet and exercise for person with BMI >25 Will recheck weight in 3-6 months    Labs pending Health Maintenance reviewed Diet and exercise encouraged  Follow up plan: 6 months   Mary-Margaret Daphine Deutscher, FNP

## 2022-11-26 NOTE — Patient Instructions (Signed)

## 2022-11-29 ENCOUNTER — Telehealth: Payer: Self-pay | Admitting: Nurse Practitioner

## 2022-11-29 NOTE — Telephone Encounter (Signed)
Pt made aware that a letter is ready for him to pick up. States that he will come by today or tomorrow to pick up.  Letter up front in "R" folder.

## 2022-11-29 NOTE — Telephone Encounter (Signed)
Patient seen 5/10 and said he needs a letter for work, stated that he is still having pain in his testicles so he has not went back to work. Needs it to say that he will not be able to return until he sees urologist. Please let him know if this is not possible.

## 2022-11-29 NOTE — Telephone Encounter (Signed)
Ok for note 

## 2022-11-29 NOTE — Telephone Encounter (Signed)
Okay for note

## 2022-12-02 ENCOUNTER — Ambulatory Visit: Payer: Managed Care, Other (non HMO) | Admitting: Urology

## 2022-12-02 ENCOUNTER — Encounter: Payer: Self-pay | Admitting: Urology

## 2022-12-02 VITALS — BP 117/71 | HR 95

## 2022-12-02 DIAGNOSIS — Z8546 Personal history of malignant neoplasm of prostate: Secondary | ICD-10-CM | POA: Diagnosis not present

## 2022-12-02 DIAGNOSIS — N138 Other obstructive and reflux uropathy: Secondary | ICD-10-CM | POA: Diagnosis not present

## 2022-12-02 DIAGNOSIS — N401 Enlarged prostate with lower urinary tract symptoms: Secondary | ICD-10-CM | POA: Diagnosis not present

## 2022-12-02 DIAGNOSIS — R351 Nocturia: Secondary | ICD-10-CM

## 2022-12-02 DIAGNOSIS — N453 Epididymo-orchitis: Secondary | ICD-10-CM

## 2022-12-02 DIAGNOSIS — R339 Retention of urine, unspecified: Secondary | ICD-10-CM

## 2022-12-02 LAB — BLADDER SCAN: Scan Result: 5

## 2022-12-02 MED ORDER — DOXYCYCLINE HYCLATE 100 MG PO CAPS: 100.0000 mg | ORAL_CAPSULE | Freq: Two times a day (BID) | ORAL | 0 refills | Status: DC

## 2022-12-02 NOTE — Progress Notes (Signed)
Subjective: 1. History of prostate cancer   2. Orchitis and epididymitis   3. Nocturia   4. Incomplete bladder emptying      12/02/22: Shivank returns today in f/u. He was seen in the ER on 11/24/22 with right testicular pain and difficulty voiding.  A scrotal US was consistent with right epdidymitis but he had no swelling.  His UA was nit+ but had only rare bacteria and 0-5 WBC.  A culture was not done.  He was given doxycycline and is doing better with that.   He had a seed implant in 2013 and his PSA remained undetectible in 1/24. His UA is clear today.  He was started on tamsulosin on 5/10 and his IPSS is 9 with nocturia x 2.   GU Hx; Avaneesh is a 67 yo male a 3 month history of left testicular pain and swelling.  He was most recently seen in the [facility] ED and has been treated with antibiotics 3-4x.  He had e. Coli on culture on 10/20/21 and 12/06/21 at his last ER visit.  He was given doxycycline in March but the bacteria was resistent.  He was given levaquin for 7 days on 10/20/21 and was last treated with levaquin for only 5 days based on sensitivities and that helped it but the swelling hasn't resolved.  He had a negative culture on 10/12/21.  He has a history of prostate cancer treated with a seed implant in 2013 by Dr. Isabel Caprice.  His PSA's have been good with the last, <01 in 5/22.  A scrotal US on 10/20/21 showed a complex left hydrocele with epididymitis.   He is voiding with some urgency.  He has no nocturia.  He has occasional dysuria.  He has had no hematuria.   He has a history of BPH with BOO and is on tamsulosin.  His UA is clear today.     IPSS     Row Name 12/02/22 1500         International Prostate Symptom Score   How often have you had the sensation of not emptying your bladder? About half the time     How often have you had to urinate less than every two hours? Less than half the time     How often have you found you stopped and started again several times when you urinated? Less than  half the time     How often have you found it difficult to postpone urination? Not at All     How often have you had a weak urinary stream? Not at All     How often have you had to strain to start urination? Not at All     How many times did you typically get up at night to urinate? 2 Times     Total IPSS Score 9       Quality of Life due to urinary symptoms   If you were to spend the rest of your life with your urinary condition just the way it is now how would you feel about that? Unhappy              ROS:  Review of Systems  Musculoskeletal:  Positive for joint pain.  All other systems reviewed and are negative.   No Known Allergies  Past Medical History:  Diagnosis Date   Bronchitis    DX 10-03-2012--  ON ANTIBIOTICS   DM (diabetes mellitus), type 2 (HCC)    History of BPH  W/ BOO   History of gastric ulcer 2010   Hyperlipidemia    Hypertension    Prostate cancer (HCC) 06/29/12   Adenocarcinoma,gleason=3+4=7,& 3+3=6,PSA=4.23,volume=38cc    Past Surgical History:  Procedure Laterality Date   RADIOACTIVE SEED IMPLANT N/A 10/16/2012   Procedure: RADIOACTIVE SEED IMPLANT;  Surgeon: Valetta Fuller, MD;  Location: [facility];  Service: Urology;  Laterality: N/A;  69 seeds implanted no seeds found in bladder     Social History   Socioeconomic History   Marital status: Single    Spouse name: Not on file   Number of children: 4   Years of education: Not on file   Highest education level: Not on file  Occupational History   Occupation: truck Air traffic controller: [employer]  Tobacco Use   Smoking status: Every Day    Packs/day: 0.50    Years: 30.00    Additional pack years: 0.00    Total pack years: 15.00    Types: Cigarettes   Smokeless tobacco: Never  Substance and Sexual Activity   Alcohol use: Yes    Alcohol/week: 35.0 standard drinks of alcohol    Types: 35 Cans of beer per week    Comment: 5-6 cans beer per day   Drug use: No    Sexual activity: Yes  Other Topics Concern   Not on file  Social History Narrative   Not on file   Social Determinants of Health   Financial Resource Strain: Not on file  Food Insecurity: Not on file  Transportation Needs: Not on file  Physical Activity: Not on file  Stress: Not on file  Social Connections: Not on file  Intimate Partner Violence: Not on file    Family History  Problem Relation Age of Onset   Stroke Mother    Cancer Father        prostate    Anti-infectives: Anti-infectives (From admission, onward)    Start     Dose/Rate Route Frequency Ordered Stop   12/02/22 0000  doxycycline (VIBRAMYCIN) 100 MG capsule        100 mg Oral 2 times daily 12/02/22 1535         Current Outpatient Medications  Medication Sig Dispense Refill   amLODipine (NORVASC) 5 MG tablet Take 1 tablet by mouth once daily 90 tablet 1   atorvastatin (LIPITOR) 40 MG tablet Take 1 tablet (40 mg total) by mouth daily. (NEEDS TO BE SEEN BEFORE NEXT REFILL) 90 tablet 1   fenofibrate (TRICOR) 145 MG tablet TAKE 1 TABLET BY MOUTH ONCE DAILY (Needs to be seen before next refill) 90 tablet 1   lisinopril-hydrochlorothiazide (ZESTORETIC) 20-25 MG tablet Take 2 tablets by mouth daily. (Needs to be seen before next refill) 180 tablet 1   sitaGLIPtin-metformin (JANUMET) 50-1000 MG tablet Take 1 tablet by mouth 2 (two) times daily with a meal. (NEEDS TO BE SEEN BEFORE NEXT REFILL) 60 tablet 5   tamsulosin (FLOMAX) 0.4 MG CAPS capsule Take 1 capsule (0.4 mg total) by mouth daily. 90 capsule 1   doxycycline (VIBRAMYCIN) 100 MG capsule Take 1 capsule (100 mg total) by mouth 2 (two) times daily. 20 capsule 0   No current facility-administered medications for this visit.     Objective: Vital signs in last 24 hours: BP 117/71   Pulse 95   Intake/Output from previous day: No intake/output data recorded. Intake/Output this shift: @IOTHISSHIFT @   Physical Exam Vitals reviewed.  Constitutional:  Appearance: Normal appearance.  Genitourinary:    Comments: Uncirc phallus with adequate meatus. Scrotum normal Testes normal. Right epididymal body and tail enlarged, indurated and mildly tender.  Neurological:     Mental Status: He is alert.     Lab Results:  No results found for this or any previous visit (from the past 24 hour(s)).   BMET No results for input(s): "NA", "K", "CL", "CO2", "GLUCOSE", "BUN", "CREATININE", "CALCIUM" in the last 72 hours. PT/INR No results for input(s): "LABPROT", "INR" in the last 72 hours. ABG No results for input(s): "PHART", "HCO3" in the last 72 hours.  Invalid input(s): "PCO2", "PO2" Recent labs reviewed. Recent Results (from the past 2160 hour(s))  CBG monitoring, ED     Status: Abnormal   Collection Time: 11/24/22 12:47 PM  Result Value Ref Range   Glucose-Capillary 127 (H) 70 - 99 mg/dL    Comment: Glucose reference range applies only to samples taken after fasting for at least 8 hours.  Urinalysis, Routine w reflex microscopic -Urine, Clean Catch     Status: Abnormal   Collection Time: 11/24/22 12:49 PM  Result Value Ref Range   Color, Urine AMBER (A) YELLOW    Comment: BIOCHEMICALS MAY BE AFFECTED BY COLOR   APPearance CLEAR CLEAR   Specific Gravity, Urine 1.014 1.005 - 1.030   pH 6.0 5.0 - 8.0   Glucose, UA NEGATIVE NEGATIVE mg/dL   Hgb urine dipstick NEGATIVE NEGATIVE   Bilirubin Urine NEGATIVE NEGATIVE   Ketones, ur NEGATIVE NEGATIVE mg/dL   Protein, ur NEGATIVE NEGATIVE mg/dL   Nitrite POSITIVE (A) NEGATIVE   Leukocytes,Ua NEGATIVE NEGATIVE   RBC / HPF 0-5 0 - 5 RBC/hpf   WBC, UA 0-5 0 - 5 WBC/hpf   Bacteria, UA RARE (A) NONE SEEN   Squamous Epithelial / HPF 0-5 0 - 5 /HPF   Mucus PRESENT     Comment: Performed at [facility], [address] [place], [place] [address]  Bayer DCA Hb A1c Waived     Status: Abnormal   Collection Time: 11/26/22  9:19 AM  Result Value Ref Range   HB A1C (BAYER DCA - WAIVED) 6.7 (H)  4.8 - 5.6 %    Comment:          Prediabetes: 5.7 - 6.4          Diabetes: >6.4          Glycemic control for adults with diabetes: <7.0   Urinalysis, Routine w reflex microscopic     Status: Abnormal   Collection Time: 12/02/22  3:29 PM  Result Value Ref Range   Specific Gravity, UA 1.025 1.005 - 1.030   pH, UA 5.5 5.0 - 7.5   Color, UA Yellow Yellow   Appearance Ur Clear Clear   Leukocytes,UA Negative Negative   Protein,UA Trace Negative/Trace   Glucose, UA Negative Negative   Ketones, UA Trace (A) Negative   RBC, UA Negative Negative   Bilirubin, UA Negative Negative   Urobilinogen, Ur 1.0 0.2 - 1.0 mg/dL   Nitrite, UA Negative Negative   Microscopic Examination Comment     Comment: Microscopic not indicated and not performed.  Bladder scan     Status: None   Collection Time: 12/02/22  3:49 PM  Result Value Ref Range   Scan Result 5     Studies/Results: No results found. Scrotal US freviewed.    US SCROTUM W/DOPPLER  Result Date: 11/24/2022 CLINICAL DATA:  RIGHT testicular pain for 2 weeks. EXAM:  SCROTAL ULTRASOUND DOPPLER ULTRASOUND OF THE TESTICLES TECHNIQUE: Complete ultrasound examination of the testicles, epididymis, and other scrotal structures was performed. Color and spectral Doppler ultrasound were also utilized to evaluate blood flow to the testicles. COMPARISON:  None Available. FINDINGS: Right testicle Measurements: 3.9 x 2.0 x 3.6 centimeters. Single echogenic focus is identified in the midpole region, unlikely significance. Left testicle Measurements: 3.1 x 1.5 x 2.1 centimeters. No mass or microlithiasis visualized. Right epididymis: Normal in size and appearance. Increased vascularity noted on Doppler exam. Left epididymis:  Normal in size and appearance. Hydrocele:  None visualized. Varicocele:  None visualized. Pulsed Doppler interrogation of both testes demonstrates normal low resistance arterial and venous waveforms bilaterally. Note is made of thickened,  hyperemic scrotal skin. IMPRESSION: 1. No evidence for testicular torsion or suspicious mass. 2. Increased blood flow in the RIGHT epididymis is consistent with epididymitis. Electronically Signed   By: Norva Pavlov M.D.   On: 11/24/2022 13:55     PVR is 5ml Assessment/Plan: Right epididymitis with improvement on doxycycline.  I will extend the antibiotics for another 10 days.   History of prostate cancer with prior seeds.  His PSA's are undetectible.  BPH with BOO. He has some progressive LUTS but is back on tamsulosin.  He is emptying well but I will have him return with a flowrate for possible cystoscopy.  Meds ordered this encounter  Medications   doxycycline (VIBRAMYCIN) 100 MG capsule    Sig: Take 1 capsule (100 mg total) by mouth 2 (two) times daily.    Dispense:  20 capsule    Refill:  0     Orders Placed This Encounter  Procedures   Urinalysis, Routine w reflex microscopic   Bladder scan     Return in about 6 weeks (around 01/13/2023) for return for a flowrate and possible cystoscopy. .    CC: Dr. Bennie Pierini.      Bjorn Pippin 12/03/2022 [phone]

## 2022-12-02 NOTE — Progress Notes (Signed)
post void residual   5 ml

## 2022-12-03 LAB — URINALYSIS, ROUTINE W REFLEX MICROSCOPIC
Bilirubin, UA: NEGATIVE
Glucose, UA: NEGATIVE
Leukocytes,UA: NEGATIVE
Nitrite, UA: NEGATIVE
RBC, UA: NEGATIVE
Specific Gravity, UA: 1.025 (ref 1.005–1.030)
Urobilinogen, Ur: 1 mg/dL (ref 0.2–1.0)
pH, UA: 5.5 (ref 5.0–7.5)

## 2023-01-13 ENCOUNTER — Ambulatory Visit: Payer: Managed Care, Other (non HMO) | Admitting: Urology

## 2023-03-03 ENCOUNTER — Ambulatory Visit: Payer: Managed Care, Other (non HMO) | Admitting: Urology

## 2023-03-03 NOTE — Progress Notes (Deleted)
Subjective: No diagnosis found.    03/03/23: Brandon Elliott returns today in f/u for his history of prostate cancer and epididymitis.  12/02/22: Brandon Elliott returns today in f/u. He was seen in the ER on 11/24/22 with right testicular pain and difficulty voiding.  A scrotal US was consistent with right epdidymitis but he had no swelling.  His UA was nit+ but had only rare bacteria and 0-5 WBC.  A culture was not done.  He was given doxycycline and is doing better with that.   He had a seed implant in 2013 and his PSA remained undetectible in 1/24. His UA is clear today.  He was started on tamsulosin on 5/10 and his IPSS is 9 with nocturia x 2.   GU Hx; Brandon Elliott is a 67 yo male a 3 month history of left testicular pain and swelling.  He was most recently seen in the [facility] ED and has been treated with antibiotics 3-4x.  He had e. Coli on culture on 10/20/21 and 12/06/21 at his last ER visit.  He was given doxycycline in March but the bacteria was resistent.  He was given levaquin for 7 days on 10/20/21 and was last treated with levaquin for only 5 days based on sensitivities and that helped it but the swelling hasn't resolved.  He had a negative culture on 10/12/21.  He has a history of prostate cancer treated with a seed implant in 2013 by Dr. Isabel Caprice.  His PSA's have been good with the last, <01 in 5/22.  A scrotal US on 10/20/21 showed a complex left hydrocele with epididymitis.   He is voiding with some urgency.  He has no nocturia.  He has occasional dysuria.  He has had no hematuria.   He has a history of BPH with BOO and is on tamsulosin.  His UA is clear today.       ROS:  Review of Systems  Musculoskeletal:  Positive for joint pain.  All other systems reviewed and are negative.   No Known Allergies  Past Medical History:  Diagnosis Date   Bronchitis    DX 10-03-2012--  ON ANTIBIOTICS   DM (diabetes mellitus), type 2 (HCC)    History of BPH    W/ BOO   History of gastric ulcer 2010   Hyperlipidemia     Hypertension    Prostate cancer (HCC) 06/29/12   Adenocarcinoma,gleason=3+4=7,& 3+3=6,PSA=4.23,volume=38cc    Past Surgical History:  Procedure Laterality Date   RADIOACTIVE SEED IMPLANT N/A 10/16/2012   Procedure: RADIOACTIVE SEED IMPLANT;  Surgeon: Valetta Fuller, MD;  Location: [facility];  Service: Urology;  Laterality: N/A;  69 seeds implanted no seeds found in bladder     Social History   Socioeconomic History   Marital status: Single    Spouse name: Not on file   Number of children: 4   Years of education: Not on file   Highest education level: Not on file  Occupational History   Occupation: truck Air traffic controller: [employer]  Tobacco Use   Smoking status: Every Day    Current packs/day: 0.50    Average packs/day: 0.5 packs/day for 30.0 years (15.0 ttl pk-yrs)    Types: Cigarettes   Smokeless tobacco: Never  Substance and Sexual Activity   Alcohol use: Yes    Alcohol/week: 35.0 standard drinks of alcohol    Types: 35 Cans of beer per week    Comment: 5-6 cans beer per day   Drug use:  No   Sexual activity: Yes  Other Topics Concern   Not on file  Social History Narrative   Not on file   Social Determinants of Health   Financial Resource Strain: Not on file  Food Insecurity: Not on file  Transportation Needs: Not on file  Physical Activity: Not on file  Stress: Not on file  Social Connections: Not on file  Intimate Partner Violence: Not on file    Family History  Problem Relation Age of Onset   Stroke Mother    Cancer Father        prostate    Anti-infectives: Anti-infectives (From admission, onward)    None       Current Outpatient Medications  Medication Sig Dispense Refill   amLODipine (NORVASC) 5 MG tablet Take 1 tablet by mouth once daily 90 tablet 1   atorvastatin (LIPITOR) 40 MG tablet Take 1 tablet (40 mg total) by mouth daily. (NEEDS TO BE SEEN BEFORE NEXT REFILL) 90 tablet 1   doxycycline (VIBRAMYCIN) 100 MG  capsule Take 1 capsule (100 mg total) by mouth 2 (two) times daily. 20 capsule 0   fenofibrate (TRICOR) 145 MG tablet TAKE 1 TABLET BY MOUTH ONCE DAILY (Needs to be seen before next refill) 90 tablet 1   lisinopril-hydrochlorothiazide (ZESTORETIC) 20-25 MG tablet Take 2 tablets by mouth daily. (Needs to be seen before next refill) 180 tablet 1   sitaGLIPtin-metformin (JANUMET) 50-1000 MG tablet Take 1 tablet by mouth 2 (two) times daily with a meal. (NEEDS TO BE SEEN BEFORE NEXT REFILL) 60 tablet 5   tamsulosin (FLOMAX) 0.4 MG CAPS capsule Take 1 capsule (0.4 mg total) by mouth daily. 90 capsule 1   No current facility-administered medications for this visit.     Objective: Vital signs in last 24 hours: There were no vitals taken for this visit.  Intake/Output from previous day: No intake/output data recorded. Intake/Output this shift: @IOTHISSHIFT @   Physical Exam Vitals reviewed.  Constitutional:      Appearance: Normal appearance.  Genitourinary:    Comments: Uncirc phallus with adequate meatus. Scrotum normal Testes normal. Right epididymal body and tail enlarged, indurated and mildly tender.  Neurological:     Mental Status: He is alert.     Lab Results:  No results found for this or any previous visit (from the past 24 hour(s)).   BMET No results for input(s): "NA", "K", "CL", "CO2", "GLUCOSE", "BUN", "CREATININE", "CALCIUM" in the last 72 hours. PT/INR No results for input(s): "LABPROT", "INR" in the last 72 hours. ABG No results for input(s): "PHART", "HCO3" in the last 72 hours.  Invalid input(s): "PCO2", "PO2" Recent labs reviewed. No results found for this or any previous visit (from the past 2160 hour(s)).   Studies/Results: No results found. Scrotal US freviewed.    No results found.   PVR is 5ml Assessment/Plan: Right epididymitis with improvement on doxycycline.  I will extend the antibiotics for another 10 days.   History of prostate cancer  with prior seeds.  His PSA's are undetectible.  BPH with BOO. He has some progressive LUTS but is back on tamsulosin.  He is emptying well but I will have him return with a flowrate for possible cystoscopy.  No orders of the defined types were placed in this encounter.    No orders of the defined types were placed in this encounter.    No follow-ups on file.    CC: Dr. Bennie Pierini.      Brandon Elliott  03/03/2023 [phone] 

## 2023-03-24 ENCOUNTER — Ambulatory Visit: Payer: Managed Care, Other (non HMO) | Admitting: Urology

## 2023-05-29 ENCOUNTER — Other Ambulatory Visit: Payer: Self-pay | Admitting: Nurse Practitioner

## 2023-05-29 DIAGNOSIS — E1169 Type 2 diabetes mellitus with other specified complication: Secondary | ICD-10-CM

## 2023-05-29 DIAGNOSIS — I10 Essential (primary) hypertension: Secondary | ICD-10-CM

## 2023-05-30 ENCOUNTER — Encounter: Payer: Self-pay | Admitting: Nurse Practitioner

## 2023-05-30 ENCOUNTER — Ambulatory Visit: Payer: Managed Care, Other (non HMO) | Admitting: Nurse Practitioner

## 2023-05-30 VITALS — BP 118/73 | HR 90 | Temp 98.2°F | Resp 20 | Ht 66.0 in | Wt 174.0 lb

## 2023-05-30 DIAGNOSIS — Z23 Encounter for immunization: Secondary | ICD-10-CM

## 2023-05-30 DIAGNOSIS — I10 Essential (primary) hypertension: Secondary | ICD-10-CM | POA: Diagnosis not present

## 2023-05-30 DIAGNOSIS — Z6832 Body mass index (BMI) 32.0-32.9, adult: Secondary | ICD-10-CM

## 2023-05-30 DIAGNOSIS — E1169 Type 2 diabetes mellitus with other specified complication: Secondary | ICD-10-CM

## 2023-05-30 DIAGNOSIS — Z7984 Long term (current) use of oral hypoglycemic drugs: Secondary | ICD-10-CM

## 2023-05-30 DIAGNOSIS — E785 Hyperlipidemia, unspecified: Secondary | ICD-10-CM

## 2023-05-30 DIAGNOSIS — C61 Malignant neoplasm of prostate: Secondary | ICD-10-CM | POA: Diagnosis not present

## 2023-05-30 DIAGNOSIS — E119 Type 2 diabetes mellitus without complications: Secondary | ICD-10-CM

## 2023-05-30 LAB — BAYER DCA HB A1C WAIVED: HB A1C (BAYER DCA - WAIVED): 6.7 % — ABNORMAL HIGH (ref 4.8–5.6)

## 2023-05-30 MED ORDER — LISINOPRIL-HYDROCHLOROTHIAZIDE 20-25 MG PO TABS: 2.0000 | ORAL_TABLET | Freq: Every day | ORAL | 1 refills | Status: DC

## 2023-05-30 MED ORDER — FENOFIBRATE 145 MG PO TABS: ORAL_TABLET | ORAL | 1 refills | Status: DC

## 2023-05-30 MED ORDER — JANUMET 50-1000 MG PO TABS
1.0000 | ORAL_TABLET | Freq: Two times a day (BID) | ORAL | 5 refills | Status: DC
Start: 2023-05-30 — End: 2023-08-05

## 2023-05-30 MED ORDER — ATORVASTATIN CALCIUM 40 MG PO TABS
40.0000 mg | ORAL_TABLET | Freq: Every day | ORAL | 1 refills | Status: DC
Start: 2023-05-30 — End: 2023-09-01

## 2023-05-30 MED ORDER — TAMSULOSIN HCL 0.4 MG PO CAPS
0.4000 mg | ORAL_CAPSULE | Freq: Every day | ORAL | 1 refills | Status: DC
Start: 2023-05-30 — End: 2023-09-01

## 2023-05-30 MED ORDER — AMLODIPINE BESYLATE 5 MG PO TABS
ORAL_TABLET | ORAL | 1 refills | Status: DC
Start: 2023-05-30 — End: 2023-09-01

## 2023-05-30 NOTE — Progress Notes (Signed)
Subjective:    Patient ID: Brandon Elliott, male    DOB: [birth year], 67 y.o.   MRN: [id number]   Chief Complaint: medical management of chronic issues     HPI:  Brandon Elliott is a 66 y.o. who identifies as a male who was assigned male at birth.   Social history: Lives with: girlfriend Work history: Runner, broadcasting/film/video   Comes in today for follow up of the following chronic medical issues:  1. Primary hypertension No c/o chhest pain, sob or headache. Doe snot check blood pressure at home. BP Readings from Last 3 Encounters:  12/02/22 117/71  11/26/22 118/67  11/24/22 124/68     2. Hyperlipidemia associated with type 2 diabetes mellitus (HCC) Does try to watch diet and stays very active. Lab Results  Component Value Date   CHOL 99 (L) 08/17/2022   HDL 35 (L) 08/17/2022   LDLCALC 40 08/17/2022   TRIG 140 08/17/2022   CHOLHDL 2.8 08/17/2022    3. Diabetes mellitus treated with oral medication (HCC) Does not check blood sugars at home very often Lab Results  Component Value Date   HGBA1C 6.7 (H) 11/26/2022  ;  4. Prostate cancer (HCC) No voiding issues Lab Results  Component Value Date   PSA1 <0.1 08/17/2022   PSA1 <0.1 11/24/2020   PSA1 <0.1 10/22/2019      5. BMI 32.0-32.9,adult No recent weight changes Wt Readings from Last 3 Encounters:  05/30/23 174 lb (78.9 kg)  11/26/22 174 lb (78.9 kg)  08/17/22 181 lb (82.1 kg)   BMI Readings from Last 3 Encounters:  05/30/23 28.08 kg/m  11/26/22 28.08 kg/m  08/17/22 29.21 kg/m      New complaints: None today  No Known Allergies Outpatient Encounter Medications as of 05/30/2023  Medication Sig   amLODipine (NORVASC) 5 MG tablet Take 1 tablet by mouth once daily   atorvastatin (LIPITOR) 40 MG tablet Take 1 tablet (40 mg total) by mouth daily. (NEEDS TO BE SEEN BEFORE NEXT REFILL)   fenofibrate (TRICOR) 145 MG tablet TAKE 1 TABLET BY MOUTH ONCE DAILY (Needs to be seen before next refill)    lisinopril-hydrochlorothiazide (ZESTORETIC) 20-25 MG tablet Take 2 tablets by mouth daily. (Needs to be seen before next refill)   sitaGLIPtin-metformin (JANUMET) 50-1000 MG tablet Take 1 tablet by mouth 2 (two) times daily with a meal. (NEEDS TO BE SEEN BEFORE NEXT REFILL)   tamsulosin (FLOMAX) 0.4 MG CAPS capsule Take 1 capsule (0.4 mg total) by mouth daily.   [DISCONTINUED] doxycycline (VIBRAMYCIN) 100 MG capsule Take 1 capsule (100 mg total) by mouth 2 (two) times daily.   No facility-administered encounter medications on file as of 05/30/2023.    Past Surgical History:  Procedure Laterality Date   RADIOACTIVE SEED IMPLANT N/A 10/16/2012   Procedure: RADIOACTIVE SEED IMPLANT;  Surgeon: Valetta Fuller, MD;  Location: [facility];  Service: Urology;  Laterality: N/A;  69 seeds implanted no seeds found in bladder     Family History  Problem Relation Age of Onset   Stroke Mother    Cancer Father        prostate      Controlled substance contract: n/a     Review of Systems  Constitutional:  Negative for diaphoresis.  Eyes:  Negative for pain.  Respiratory:  Negative for shortness of breath.   Cardiovascular:  Negative for chest pain, palpitations and leg swelling.  Gastrointestinal:  Negative for abdominal pain.  Endocrine: Negative for  polydipsia.  Skin:  Negative for rash.  Neurological:  Negative for dizziness, weakness and headaches.  Hematological:  Does not bruise/bleed easily.  All other systems reviewed and are negative.      Objective:   Physical Exam Vitals and nursing note reviewed.  Constitutional:      Appearance: Normal appearance. He is well-developed.  HENT:     Head: Normocephalic.     Nose: Nose normal.     Mouth/Throat:     Mouth: Mucous membranes are moist.     Pharynx: Oropharynx is clear.  Eyes:     Pupils: Pupils are equal, round, and reactive to light.  Neck:     Thyroid: No thyroid mass or thyromegaly.     Vascular: No  carotid bruit or JVD.     Trachea: Phonation normal.  Cardiovascular:     Rate and Rhythm: Normal rate and regular rhythm.  Pulmonary:     Effort: Pulmonary effort is normal. No respiratory distress.     Breath sounds: Normal breath sounds.  Abdominal:     General: Bowel sounds are normal.     Palpations: Abdomen is soft.     Tenderness: There is no abdominal tenderness.  Musculoskeletal:        General: Normal range of motion.     Cervical back: Normal range of motion and neck supple.  Lymphadenopathy:     Cervical: No cervical adenopathy.  Skin:    General: Skin is warm and dry.  Neurological:     Mental Status: He is alert and oriented to person, place, and time.  Psychiatric:        Behavior: Behavior normal.        Thought Content: Thought content normal.        Judgment: Judgment normal.       BP 118/73   Pulse 90   Temp 98.2 F (36.8 C) (Temporal)   Resp 20   Ht 5\' 6"  (1.676 m)   Wt 174 lb (78.9 kg)   SpO2 99%   BMI 28.08 kg/m   Hgba1c 6.7%     Assessment & Plan:   Brandon Elliott comes in today with chief complaint of Medical Management of Chronic Issues   Diagnosis and orders addressed:  1. Primary hypertension Low sodium diet - CBC with Differential/Platelet - CMP14+EGFR - amLODipine (NORVASC) 5 MG tablet; Take 1 tablet by mouth once daily  Dispense: 90 tablet; Refill: 1 - lisinopril-hydrochlorothiazide (ZESTORETIC) 20-25 MG tablet; Take 2 tablets by mouth daily. (Needs to be seen before next refill)  Dispense: 180 tablet; Refill: 1  2. Hyperlipidemia associated with type 2 diabetes mellitus (HCC) Low fat diet - Lipid panel - atorvastatin (LIPITOR) 40 MG tablet; Take 1 tablet (40 mg total) by mouth daily. (NEEDS TO BE SEEN BEFORE NEXT REFILL)  Dispense: 90 tablet; Refill: 1 - fenofibrate (TRICOR) 145 MG tablet; TAKE 1 TABLET BY MOUTH ONCE DAILY (Needs to be seen before next refill)  Dispense: 90 tablet; Refill: 1  3. Diabetes mellitus treated  with oral medication (HCC) - sitaGLIPtin-metformin (JANUMET) 50-1000 MG tablet; Take 1 tablet by mouth 2 (two) times daily with a meal. (NEEDS TO BE SEEN BEFORE NEXT REFILL)  Dispense: 60 tablet; Refill: 5  - Bayer DCA Hb A1c Waived  4. Prostate cancer [facility]) Report any voiding issues - tamsulosin (FLOMAX) 0.4 MG CAPS capsule; Take 1 capsule (0.4 mg total) by mouth daily.  Dispense: 90 capsule; Refill: 1  5. BMI 32.0-32.9,adult Discussed diet  and exercise for person with BMI >25 Will recheck weight in 3-6 months    Labs pending Health Maintenance reviewed Diet and exercise encouraged  Follow up plan: 3 months   Mary-Margaret Daphine Deutscher, FNP

## 2023-05-30 NOTE — Patient Instructions (Signed)

## 2023-05-31 LAB — CMP14+EGFR
ALT: 17 IU/L (ref 0–44)
AST: 21 IU/L (ref 0–40)
Albumin: 4.1 g/dL (ref 3.9–4.9)
Alkaline Phosphatase: 77 [IU]/L (ref 44–121)
BUN/Creatinine Ratio: 23 (ref 10–24)
BUN: 27 mg/dL (ref 8–27)
Bilirubin Total: 0.4 mg/dL (ref 0.0–1.2)
CO2: 25 mmol/L (ref 20–29)
Calcium: 9.8 mg/dL (ref 8.6–10.2)
Chloride: 98 mmol/L (ref 96–106)
Creatinine, Ser: 1.16 mg/dL (ref 0.76–1.27)
Globulin, Total: 2.4 g/dL (ref 1.5–4.5)
Glucose: 166 mg/dL — ABNORMAL HIGH (ref 70–99)
Potassium: 4.8 mmol/L (ref 3.5–5.2)
Sodium: 138 mmol/L (ref 134–144)
Total Protein: 6.5 g/dL (ref 6.0–8.5)
eGFR: 68 mL/min/{1.73_m2} (ref >59)

## 2023-05-31 LAB — LIPID PANEL
Chol/HDL Ratio: 2.5 ratio (ref 0.0–5.0)
Cholesterol, Total: 94 mg/dL — ABNORMAL LOW (ref 100–199)
HDL: 37 mg/dL — ABNORMAL LOW (ref >39)
LDL Chol Calc (NIH): 35 mg/dL (ref 0–99)
Triglycerides: 122 mg/dL (ref 0–149)
VLDL Cholesterol Cal: 22 mg/dL (ref 5–40)

## 2023-05-31 LAB — CBC WITH DIFFERENTIAL/PLATELET
Basophils Absolute: 0 10*3/uL (ref 0.0–0.2)
Basos: 0 %
EOS (ABSOLUTE): 0 10*3/uL (ref 0.0–0.4)
Eos: 1 %
Hematocrit: 43.3 % (ref 37.5–51.0)
Hemoglobin: 14.2 g/dL (ref 13.0–17.7)
Immature Grans (Abs): 0 10*3/uL (ref 0.0–0.1)
Immature Granulocytes: 0 %
Lymphocytes Absolute: 1.9 10*3/uL (ref 0.7–3.1)
Lymphs: 35 %
MCH: 31.6 pg (ref 26.6–33.0)
MCHC: 32.8 g/dL (ref 31.5–35.7)
MCV: 96 fL (ref 79–97)
Monocytes Absolute: 0.6 10*3/uL (ref 0.1–0.9)
Monocytes: 11 %
Neutrophils Absolute: 2.8 10*3/uL (ref 1.4–7.0)
Neutrophils: 53 %
Platelets: 224 10*3/uL (ref 150–450)
RBC: 4.49 x10E6/uL (ref 4.14–5.80)
RDW: 13 % (ref 11.6–15.4)
WBC: 5.3 10*3/uL (ref 3.4–10.8)

## 2023-06-22 ENCOUNTER — Other Ambulatory Visit: Payer: Self-pay | Admitting: Family Medicine

## 2023-08-05 ENCOUNTER — Telehealth: Payer: Self-pay | Admitting: Nurse Practitioner

## 2023-08-05 MED ORDER — METFORMIN HCL 1000 MG PO TABS: 1000.0000 mg | ORAL_TABLET | Freq: Two times a day (BID) | ORAL | 1 refills | Status: DC

## 2023-08-05 NOTE — Telephone Encounter (Signed)
Since insurance will not cover Janumet will change to plain metformin 1000mg  BID . Last hgba1c was 6.4%. watch diet and keep a check of blood sugars at home.  Brandon Daphine Deutscher, FNP  Meds ordered this encounter  Medications   metFORMIN (GLUCOPHAGE) 1000 MG tablet    Sig: Take 1 tablet (1,000 mg total) by mouth 2 (two) times daily with a meal.    Dispense:  180 tablet    Refill:  1    Supervising Provider:   Arville Care A [[id number]]

## 2023-08-05 NOTE — Telephone Encounter (Signed)
Patient notified and verbalized understanding. 

## 2023-08-05 NOTE — Telephone Encounter (Signed)
Copied from CRM [id number]. Topic: Clinical - Prescription Issue >> Aug 05, 2023  3:50 PM Brandon Elliott wrote: Reason for CRM: Patient is calling to report that his prescription for sitaGLIPtin-metformin (JANUMET) 50-1000 MG tablet is no longer being covered - he has a new insurance plan this year. Can he have something else called into his local [facility] pharmacy?

## 2023-08-05 NOTE — Telephone Encounter (Signed)
Please review

## 2023-08-05 NOTE — Addendum Note (Signed)
Addended by: Bennie Pierini on: 08/05/2023 04:19 PM   Modules accepted: Orders

## 2023-08-30 ENCOUNTER — Ambulatory Visit: Payer: Managed Care, Other (non HMO) | Admitting: Nurse Practitioner

## 2023-09-01 ENCOUNTER — Encounter: Payer: Self-pay | Admitting: Nurse Practitioner

## 2023-09-01 ENCOUNTER — Ambulatory Visit (INDEPENDENT_AMBULATORY_CARE_PROVIDER_SITE_OTHER): Payer: Medicare Other | Admitting: Nurse Practitioner

## 2023-09-01 VITALS — BP 146/76 | HR 75 | Temp 98.0°F | Ht 66.0 in | Wt 175.0 lb

## 2023-09-01 DIAGNOSIS — C61 Malignant neoplasm of prostate: Secondary | ICD-10-CM | POA: Diagnosis not present

## 2023-09-01 DIAGNOSIS — E1169 Type 2 diabetes mellitus with other specified complication: Secondary | ICD-10-CM | POA: Diagnosis not present

## 2023-09-01 DIAGNOSIS — E785 Hyperlipidemia, unspecified: Secondary | ICD-10-CM

## 2023-09-01 DIAGNOSIS — Z6832 Body mass index (BMI) 32.0-32.9, adult: Secondary | ICD-10-CM

## 2023-09-01 DIAGNOSIS — N3000 Acute cystitis without hematuria: Secondary | ICD-10-CM

## 2023-09-01 DIAGNOSIS — R300 Dysuria: Secondary | ICD-10-CM

## 2023-09-01 DIAGNOSIS — E119 Type 2 diabetes mellitus without complications: Secondary | ICD-10-CM

## 2023-09-01 DIAGNOSIS — Z7984 Long term (current) use of oral hypoglycemic drugs: Secondary | ICD-10-CM

## 2023-09-01 DIAGNOSIS — I10 Essential (primary) hypertension: Secondary | ICD-10-CM | POA: Diagnosis not present

## 2023-09-01 LAB — MICROSCOPIC EXAMINATION
Renal Epithel, UA: NONE SEEN /[HPF]
WBC, UA: >30 /[HPF] — AB (ref 0–5)
Yeast, UA: NONE SEEN

## 2023-09-01 LAB — URINALYSIS, COMPLETE
Bilirubin, UA: NEGATIVE
Glucose, UA: NEGATIVE
Nitrite, UA: NEGATIVE
Specific Gravity, UA: >1.03 — ABNORMAL HIGH (ref 1.005–1.030)
Urobilinogen, Ur: 0.2 mg/dL (ref 0.2–1.0)
pH, UA: 5.5 (ref 5.0–7.5)

## 2023-09-01 LAB — BAYER DCA HB A1C WAIVED: HB A1C (BAYER DCA - WAIVED): 6.6 % — ABNORMAL HIGH (ref 4.8–5.6)

## 2023-09-01 MED ORDER — AMLODIPINE BESYLATE 5 MG PO TABS
ORAL_TABLET | ORAL | 1 refills | Status: DC
Start: 2023-09-01 — End: 2024-02-21

## 2023-09-01 MED ORDER — LISINOPRIL-HYDROCHLOROTHIAZIDE 20-25 MG PO TABS
2.0000 | ORAL_TABLET | Freq: Every day | ORAL | 1 refills | Status: DC
Start: 2023-09-01 — End: 2024-02-21

## 2023-09-01 MED ORDER — METFORMIN HCL 1000 MG PO TABS: 1000.0000 mg | ORAL_TABLET | Freq: Two times a day (BID) | ORAL | 1 refills | Status: DC

## 2023-09-01 MED ORDER — SULFAMETHOXAZOLE-TRIMETHOPRIM 800-160 MG PO TABS
1.0000 | ORAL_TABLET | Freq: Two times a day (BID) | ORAL | 0 refills | Status: DC
Start: 2023-09-01 — End: 2024-02-21

## 2023-09-01 MED ORDER — TAMSULOSIN HCL 0.4 MG PO CAPS: 0.4000 mg | ORAL_CAPSULE | Freq: Every day | ORAL | 1 refills | Status: DC

## 2023-09-01 MED ORDER — ATORVASTATIN CALCIUM 40 MG PO TABS
40.0000 mg | ORAL_TABLET | Freq: Every day | ORAL | 1 refills | Status: DC
Start: 2023-09-01 — End: 2024-02-21

## 2023-09-01 NOTE — Progress Notes (Signed)
Subjective:    Patient ID: Brandon Elliott, male    DOB: 1956/01/04, 68 y.o.   MRN: [id number]   Chief Complaint: medical management of chronic issues     HPI:  Brandon Elliott is a 68 y.o. who identifies as a male who was assigned male at birth.   Social history: Lives with: girlfriend Work history: Runner, broadcasting/film/video   Comes in today for follow up of the following chronic medical issues:  1. Primary hypertension No c/o chhest pain, sob or headache. Doe snot check blood pressure at home. BP Readings from Last 3 Encounters:  05/30/23 118/73  12/02/22 117/71  11/26/22 118/67     2. Hyperlipidemia associated with type 2 diabetes mellitus (HCC) Does try to watch diet and stays very active. Lab Results  Component Value Date   CHOL 94 (L) 05/30/2023   HDL 37 (L) 05/30/2023   LDLCALC 35 05/30/2023   TRIG 122 05/30/2023   CHOLHDL 2.5 05/30/2023    3. Diabetes mellitus treated with oral medication (HCC) Does not check blood sugars at home very often Lab Results  Component Value Date   HGBA1C 6.7 (H) 05/30/2023  ;  4. Prostate cancer (HCC) No voiding issues Lab Results  Component Value Date   PSA1 <0.1 08/17/2022   PSA1 <0.1 11/24/2020   PSA1 <0.1 10/22/2019      5. BMI 32.0-32.9,adult No recent weight changes  Wt Readings from Last 3 Encounters:  09/01/23 175 lb (79.4 kg)  05/30/23 174 lb (78.9 kg)  11/26/22 174 lb (78.9 kg)   BMI Readings from Last 3 Encounters:  09/01/23 28.25 kg/m  05/30/23 28.08 kg/m  11/26/22 28.08 kg/m       New complaints: Urinary dysuria urgency and frequency. Seems like bladder is not completely empyting.  No Known Allergies Outpatient Encounter Medications as of 09/01/2023  Medication Sig   amLODipine (NORVASC) 5 MG tablet Take 1 tablet by mouth once daily   atorvastatin (LIPITOR) 40 MG tablet Take 1 tablet (40 mg total) by mouth daily. (NEEDS TO BE SEEN BEFORE NEXT REFILL)   fenofibrate (TRICOR) 145 MG tablet TAKE 1  TABLET BY MOUTH ONCE DAILY (Needs to be seen before next refill)   lisinopril-hydrochlorothiazide (ZESTORETIC) 20-25 MG tablet Take 2 tablets by mouth daily. (Needs to be seen before next refill)   metFORMIN (GLUCOPHAGE) 1000 MG tablet Take 1 tablet (1,000 mg total) by mouth 2 (two) times daily with a meal.   tamsulosin (FLOMAX) 0.4 MG CAPS capsule Take 1 capsule (0.4 mg total) by mouth daily.   No facility-administered encounter medications on file as of 09/01/2023.    Past Surgical History:  Procedure Laterality Date   RADIOACTIVE SEED IMPLANT N/A 10/16/2012   Procedure: RADIOACTIVE SEED IMPLANT;  Surgeon: Valetta Fuller, MD;  Location: [facility];  Service: Urology;  Laterality: N/A;  69 seeds implanted no seeds found in bladder     Family History  Problem Relation Age of Onset   Stroke Mother    Cancer Father        prostate      Controlled substance contract: n/a     Review of Systems  Constitutional:  Negative for diaphoresis.  Eyes:  Negative for pain.  Respiratory:  Negative for shortness of breath.   Cardiovascular:  Negative for chest pain, palpitations and leg swelling.  Gastrointestinal:  Negative for abdominal pain.  Endocrine: Negative for polydipsia.  Skin:  Negative for rash.  Neurological:  Negative for  dizziness, weakness and headaches.  Hematological:  Does not bruise/bleed easily.  All other systems reviewed and are negative.      Objective:   Physical Exam Vitals and nursing note reviewed.  Constitutional:      Appearance: Normal appearance. He is well-developed.  HENT:     Head: Normocephalic.     Nose: Nose normal.     Mouth/Throat:     Mouth: Mucous membranes are moist.     Pharynx: Oropharynx is clear.  Eyes:     Pupils: Pupils are equal, round, and reactive to light.  Neck:     Thyroid: No thyroid mass or thyromegaly.     Vascular: No carotid bruit or JVD.     Trachea: Phonation normal.  Cardiovascular:     Rate and  Rhythm: Normal rate and regular rhythm.  Pulmonary:     Effort: Pulmonary effort is normal. No respiratory distress.     Breath sounds: Normal breath sounds.  Abdominal:     General: Bowel sounds are normal.     Palpations: Abdomen is soft.     Tenderness: There is no abdominal tenderness.  Musculoskeletal:        General: Normal range of motion.     Cervical back: Normal range of motion and neck supple.  Lymphadenopathy:     Cervical: No cervical adenopathy.  Skin:    General: Skin is warm and dry.  Neurological:     Mental Status: He is alert and oriented to person, place, and time.  Psychiatric:        Behavior: Behavior normal.        Thought Content: Thought content normal.        Judgment: Judgment normal.    BP (!) 146/76   Pulse 75   Temp 98 F (36.7 C) (Temporal)   Ht 5\' 6"  (1.676 m)   Wt 175 lb (79.4 kg)   SpO2 99%   BMI 28.25 kg/m     Hgba1c 6.6%     Assessment & Plan:   Brandon Elliott comes in today with chief complaint of No chief complaint on file.   Diagnosis and orders addressed:  1. Primary hypertension Low sodium diet - CBC with Differential/Platelet - CMP14+EGFR - amLODipine (NORVASC) 5 MG tablet; Take 1 tablet by mouth once daily  Dispense: 90 tablet; Refill: 1 - lisinopril-hydrochlorothiazide (ZESTORETIC) 20-25 MG tablet; Take 2 tablets by mouth daily. (Needs to be seen before next refill)  Dispense: 180 tablet; Refill: 1  2. Hyperlipidemia associated with type 2 diabetes mellitus (HCC) Low fat diet - Lipid panel - atorvastatin (LIPITOR) 40 MG tablet; Take 1 tablet (40 mg total) by mouth daily. (NEEDS TO BE SEEN BEFORE NEXT REFILL)  Dispense: 90 tablet; Refill: 1 - fenofibrate (TRICOR) 145 MG tablet; TAKE 1 TABLET BY MOUTH ONCE DAILY (Needs to be seen before next refill)  Dispense: 90 tablet; Refill: 1  3. Diabetes mellitus treated with oral medication (HCC) - sitaGLIPtin-metformin (JANUMET) 50-1000 MG tablet; Take 1 tablet by mouth 2  (two) times daily with a meal. (NEEDS TO BE SEEN BEFORE NEXT REFILL)  Dispense: 60 tablet; Refill: 5  - Bayer DCA Hb A1c Waived  4. Prostate cancer [facility]) Report any voiding issues - tamsulosin (FLOMAX) 0.4 MG CAPS capsule; Take 1 capsule (0.4 mg total) by mouth daily.  Dispense: 90 capsule; Refill: 1  5. BMI 32.0-32.9,adult Discussed diet and exercise for person with BMI >25 Will recheck weight in 3-6 months  6. Acute  cystitis Take medication as prescribe Cotton underwear Take shower not bath Cranberry juice, yogurt Force fluids AZO over the counter X2 days Culture pending RTO prn Meds ordered this encounter  Medications   sulfamethoxazole-trimethoprim (BACTRIM DS) 800-160 MG tablet    Sig: Take 1 tablet by mouth 2 (two) times daily.    Dispense:  20 tablet    Refill:  0    Supervising Provider:   Arville Care A [id number]     Labs pending Health Maintenance reviewed Diet and exercise encouraged  Follow up plan: 6 months   Mary-Margaret Daphine Deutscher, FNP

## 2023-09-01 NOTE — Patient Instructions (Signed)
Urinary Tract Infection, Male A urinary tract infection (UTI) is an infection in your urinary tract. The urinary tract is made up of the organs that make, store, and get rid of pee (urine) in your body. These organs include: The kidneys. The ureters. The bladder. The urethra. What are the causes? Most UTIs are caused by germs called bacteria. They may be in or near your genitals. These germs grow and cause swelling in your urinary tract. What increases the risk? You're more likely to get a UTI if: You have a soft tube called a catheter that drains your pee. You can't control when you pee or poop. You have trouble peeing because of: A prostate that's bigger than normal. A kidney stone. A urinary blockage. A nerve condition that affects your bladder. Not getting enough to drink. You're sexually active. You're an older adult. You're also more likely to get a UTI if you have other health problems, such as: Diabetes. A weak immune system. Your immune system is your body's defense system. Sickle cell disease. Injury of the spine. What are the signs or symptoms? Symptoms may include: Needing to pee right away. Peeing small amounts often. Trouble getting the stream started. Pain or burning when you pee. Blood in your pee. Pee that smells bad or odd. Pain in your belly or lower back. You may also: Feel confused. This may be the first symptom in older adults. Vomit. Not feel hungry. Feel tired or easily annoyed. Have a fever or chills. How is this diagnosed? A UTI is diagnosed based on your medical history and an exam. You may also have other tests. These may include: Pee tests. Blood tests. Tests for sexually transmitted infections (STIs). If you've had more than one UTI, you may need to have imaging studies done to find out why you keep getting them. How is this treated? A UTI can be treated by: Taking antibiotics or other medicines. Drinking enough fluid to keep your pee  pale yellow. In rare cases, a UTI can cause a very bad condition called sepsis. Sepsis may be treated in the hospital. Follow these instructions at home: Medicines Take your medicines only as told by your health care provider. If you were given antibiotics, take them as told by your provider. Do not stop taking them even if you start to feel better. General instructions Make sure you: Pee often and fully. Do not hold your pee for a long time. Pee after you have sex. Contact a health care provider if: Your symptoms don't get better after 1-2 days of taking antibiotics. Your symptoms go away and then come back. You have a fever or chills. You vomit or feel like you may vomit. Get help right away if: You have very bad pain in your back or lower belly. You faint. This information is not intended to replace advice given to you by your health care provider. Make sure you discuss any questions you have with your health care provider. Document Revised: 10/08/2022 Document Reviewed: 10/08/2022 Elsevier Patient Education  2024 [employer].

## 2023-09-02 LAB — CBC WITH DIFFERENTIAL/PLATELET
Basophils Absolute: 0 10*3/uL (ref 0.0–0.2)
Basos: 1 %
EOS (ABSOLUTE): 0.1 10*3/uL (ref 0.0–0.4)
Eos: 1 %
Hematocrit: 44.1 % (ref 37.5–51.0)
Hemoglobin: 14.6 g/dL (ref 13.0–17.7)
Immature Grans (Abs): 0 10*3/uL (ref 0.0–0.1)
Immature Granulocytes: 0 %
Lymphocytes Absolute: 2.2 10*3/uL (ref 0.7–3.1)
Lymphs: 43 %
MCH: 31.3 pg (ref 26.6–33.0)
MCHC: 33.1 g/dL (ref 31.5–35.7)
MCV: 94 fL (ref 79–97)
Monocytes Absolute: 0.4 10*3/uL (ref 0.1–0.9)
Monocytes: 8 %
Neutrophils Absolute: 2.4 10*3/uL (ref 1.4–7.0)
Neutrophils: 47 %
Platelets: 229 10*3/uL (ref 150–450)
RBC: 4.67 x10E6/uL (ref 4.14–5.80)
RDW: 12.7 % (ref 11.6–15.4)
WBC: 5 10*3/uL (ref 3.4–10.8)

## 2023-09-02 LAB — CMP14+EGFR
ALT: 20 [IU]/L (ref 0–44)
AST: 20 [IU]/L (ref 0–40)
Albumin: 4.1 g/dL (ref 3.9–4.9)
Alkaline Phosphatase: 60 [IU]/L (ref 44–121)
BUN/Creatinine Ratio: 16 (ref 10–24)
BUN: 19 mg/dL (ref 8–27)
Bilirubin Total: 0.5 mg/dL (ref 0.0–1.2)
CO2: 23 mmol/L (ref 20–29)
Calcium: 10.2 mg/dL (ref 8.6–10.2)
Chloride: 102 mmol/L (ref 96–106)
Creatinine, Ser: 1.18 mg/dL (ref 0.76–1.27)
Globulin, Total: 2.6 g/dL (ref 1.5–4.5)
Glucose: 172 mg/dL — ABNORMAL HIGH (ref 70–99)
Potassium: 4.5 mmol/L (ref 3.5–5.2)
Sodium: 141 mmol/L (ref 134–144)
Total Protein: 6.7 g/dL (ref 6.0–8.5)
eGFR: 67 mL/min/{1.73_m2} (ref >59)

## 2023-09-02 LAB — LIPID PANEL
Chol/HDL Ratio: 2.7 {ratio} (ref 0.0–5.0)
Cholesterol, Total: 106 mg/dL (ref 100–199)
HDL: 39 mg/dL — ABNORMAL LOW (ref >39)
LDL Chol Calc (NIH): 51 mg/dL (ref 0–99)
Triglycerides: 80 mg/dL (ref 0–149)
VLDL Cholesterol Cal: 16 mg/dL (ref 5–40)

## 2023-09-02 LAB — PSA, TOTAL AND FREE

## 2023-09-04 LAB — URINE CULTURE

## 2023-11-03 ENCOUNTER — Telehealth: Payer: Self-pay | Admitting: Nurse Practitioner

## 2023-12-14 ENCOUNTER — Other Ambulatory Visit: Payer: Self-pay | Admitting: Nurse Practitioner

## 2023-12-14 DIAGNOSIS — E1169 Type 2 diabetes mellitus with other specified complication: Secondary | ICD-10-CM

## 2023-12-26 ENCOUNTER — Telehealth: Payer: Self-pay | Admitting: Nurse Practitioner

## 2024-02-21 ENCOUNTER — Ambulatory Visit: Payer: Managed Care, Other (non HMO) | Admitting: Nurse Practitioner

## 2024-02-21 ENCOUNTER — Encounter: Payer: Self-pay | Admitting: Nurse Practitioner

## 2024-02-21 VITALS — BP 135/73 | HR 82 | Temp 98.1°F | Ht 66.0 in | Wt 165.0 lb

## 2024-02-21 DIAGNOSIS — I10 Essential (primary) hypertension: Secondary | ICD-10-CM

## 2024-02-21 DIAGNOSIS — E1159 Type 2 diabetes mellitus with other circulatory complications: Secondary | ICD-10-CM

## 2024-02-21 DIAGNOSIS — I152 Hypertension secondary to endocrine disorders: Secondary | ICD-10-CM

## 2024-02-21 DIAGNOSIS — E785 Hyperlipidemia, unspecified: Secondary | ICD-10-CM

## 2024-02-21 DIAGNOSIS — E1169 Type 2 diabetes mellitus with other specified complication: Secondary | ICD-10-CM | POA: Diagnosis not present

## 2024-02-21 DIAGNOSIS — C61 Malignant neoplasm of prostate: Secondary | ICD-10-CM

## 2024-02-21 DIAGNOSIS — Z6832 Body mass index (BMI) 32.0-32.9, adult: Secondary | ICD-10-CM

## 2024-02-21 DIAGNOSIS — E119 Type 2 diabetes mellitus without complications: Secondary | ICD-10-CM | POA: Diagnosis not present

## 2024-02-21 DIAGNOSIS — Z7984 Long term (current) use of oral hypoglycemic drugs: Secondary | ICD-10-CM

## 2024-02-21 LAB — LIPID PANEL

## 2024-02-21 LAB — BAYER DCA HB A1C WAIVED: HB A1C (BAYER DCA - WAIVED): 7.1 % — ABNORMAL HIGH (ref 4.8–5.6)

## 2024-02-21 MED ORDER — LISINOPRIL-HYDROCHLOROTHIAZIDE 20-25 MG PO TABS: 2.0000 | ORAL_TABLET | Freq: Every day | ORAL | 1 refills | Status: DC

## 2024-02-21 MED ORDER — FENOFIBRATE 145 MG PO TABS: 145.0000 mg | ORAL_TABLET | Freq: Every day | ORAL | 1 refills | Status: DC

## 2024-02-21 MED ORDER — METFORMIN HCL 1000 MG PO TABS: 1000.0000 mg | ORAL_TABLET | Freq: Two times a day (BID) | ORAL | 1 refills | Status: DC

## 2024-02-21 MED ORDER — TAMSULOSIN HCL 0.4 MG PO CAPS: 0.4000 mg | ORAL_CAPSULE | Freq: Every day | ORAL | 1 refills | Status: DC

## 2024-02-21 MED ORDER — ATORVASTATIN CALCIUM 40 MG PO TABS: 40.0000 mg | ORAL_TABLET | Freq: Every day | ORAL | 1 refills | Status: DC

## 2024-02-21 MED ORDER — AMLODIPINE BESYLATE 5 MG PO TABS: ORAL_TABLET | ORAL | 1 refills | Status: DC

## 2024-02-21 NOTE — Patient Instructions (Signed)

## 2024-02-21 NOTE — Progress Notes (Signed)
 Subjective:    Patient ID: Brandon Elliott, male    DOB: 1956-05-19, 68 y.o.   MRN: [id number]   Chief Complaint: medical management of chronic issues     HPI:  Brandon Elliott is a 68 y.o. who identifies as a male who was assigned male at birth.   Social history: Lives with: girlfriend Work history: Runner, broadcasting/film/video   Comes in today for follow up of the following chronic medical issues:  1. Primary hypertension No c/o chhest pain, sob or headache. Doe snot check blood pressure at home. BP Readings from Last 3 Encounters:  09/01/23 (!) 146/76  05/30/23 118/73  12/02/22 117/71     2. Hyperlipidemia associated with type 2 diabetes mellitus (HCC) Does try to watch diet and stays very active. Lab Results  Component Value Date   CHOL 106 09/01/2023   HDL 39 (L) 09/01/2023   LDLCALC 51 09/01/2023   TRIG 80 09/01/2023   CHOLHDL 2.7 09/01/2023   The ASCVD Risk score (Arnett DK, et al., 2019) failed to calculate for the following reasons:   The valid total cholesterol range is 130 to 320 mg/dL  3. Diabetes mellitus treated with oral medication (HCC) Does not check blood sugars at home very often Lab Results  Component Value Date   HGBA1C 6.6 (H) 09/01/2023  ;  4. History of Prostate cancer (HCC) No voiding issues Lab Results  Component Value Date   PSA1 <0.1 09/01/2023   PSA1 <0.1 08/17/2022   PSA1 <0.1 11/24/2020      5. BMI 32.0-32.9,adult No recent weight changes  Wt Readings from Last 3 Encounters:  02/21/24 165 lb (74.8 kg)  09/01/23 175 lb (79.4 kg)  05/30/23 174 lb (78.9 kg)   BMI Readings from Last 3 Encounters:  02/21/24 26.63 kg/m  09/01/23 28.25 kg/m  05/30/23 28.08 kg/m         New complaints: None today  No Known Allergies Outpatient Encounter Medications as of 02/21/2024  Medication Sig   amLODipine  (NORVASC ) 5 MG tablet Take 1 tablet by mouth once daily   atorvastatin  (LIPITOR) 40 MG tablet Take 1 tablet (40 mg total) by mouth  daily. (NEEDS TO BE SEEN BEFORE NEXT REFILL)   fenofibrate  (TRICOR ) 145 MG tablet Take 1 tablet by mouth once daily   lisinopril -hydrochlorothiazide  (ZESTORETIC ) 20-25 MG tablet Take 2 tablets by mouth daily. (Needs to be seen before next refill)   metFORMIN  (GLUCOPHAGE ) 1000 MG tablet Take 1 tablet (1,000 mg total) by mouth 2 (two) times daily with a meal.   sulfamethoxazole -trimethoprim  (BACTRIM  DS) 800-160 MG tablet Take 1 tablet by mouth 2 (two) times daily.   tamsulosin  (FLOMAX ) 0.4 MG CAPS capsule Take 1 capsule (0.4 mg total) by mouth daily.   No facility-administered encounter medications on file as of 02/21/2024.    Past Surgical History:  Procedure Laterality Date   RADIOACTIVE SEED IMPLANT N/A 10/16/2012   Procedure: RADIOACTIVE SEED IMPLANT;  Surgeon: Alm GORMAN Fragmin, MD;  Location: [facility];  Service: Urology;  Laterality: N/A;  69 seeds implanted no seeds found in bladder     Family History  Problem Relation Age of Onset   Stroke Mother    Cancer Father        prostate      Controlled substance contract: n/a     Review of Systems  Constitutional:  Negative for diaphoresis.  Eyes:  Negative for pain.  Respiratory:  Negative for shortness of breath.   Cardiovascular:  Negative for chest pain, palpitations and leg swelling.  Gastrointestinal:  Negative for abdominal pain.  Endocrine: Negative for polydipsia.  Skin:  Negative for rash.  Neurological:  Negative for dizziness, weakness and headaches.  Hematological:  Does not bruise/bleed easily.  All other systems reviewed and are negative.      Objective:   Physical Exam Vitals and nursing note reviewed.  Constitutional:      Appearance: Normal appearance. He is well-developed.  HENT:     Head: Normocephalic.     Nose: Nose normal.     Mouth/Throat:     Mouth: Mucous membranes are moist.     Pharynx: Oropharynx is clear.  Eyes:     Pupils: Pupils are equal, round, and reactive to  light.  Neck:     Thyroid: No thyroid mass or thyromegaly.     Vascular: No carotid bruit or JVD.     Trachea: Phonation normal.  Cardiovascular:     Rate and Rhythm: Normal rate and regular rhythm.  Pulmonary:     Effort: Pulmonary effort is normal. No respiratory distress.     Breath sounds: Normal breath sounds.  Abdominal:     General: Bowel sounds are normal.     Palpations: Abdomen is soft.     Tenderness: There is no abdominal tenderness.  Musculoskeletal:        General: Normal range of motion.     Cervical back: Normal range of motion and neck supple.  Lymphadenopathy:     Cervical: No cervical adenopathy.  Skin:    General: Skin is warm and dry.  Neurological:     Mental Status: He is alert and oriented to person, place, and time.  Psychiatric:        Behavior: Behavior normal.        Thought Content: Thought content normal.        Judgment: Judgment normal.    BP 135/73   Pulse 82   Temp 98.1 F (36.7 C) (Temporal)   Ht 5' 6 (1.676 m)   Wt 165 lb (74.8 kg)   SpO2 99%   BMI 26.63 kg/m    Hgba1c 7.1%     Assessment & Plan:   Brandon Elliott comes in today with chief complaint of medical management of chronic issues    Diagnosis and orders addressed:  1. Primary hypertension Low sodium diet - CBC with Differential/Platelet - CMP14+EGFR - amLODipine  (NORVASC ) 5 MG tablet; Take 1 tablet by mouth once daily  Dispense: 90 tablet; Refill: 1 - lisinopril -hydrochlorothiazide  (ZESTORETIC ) 20-25 MG tablet; Take 2 tablets by mouth daily. (Needs to be seen before next refill)  Dispense: 180 tablet; Refill: 1  2. Hyperlipidemia associated with type 2 diabetes mellitus (HCC) Low fat diet - Lipid panel - atorvastatin  (LIPITOR) 40 MG tablet; Take 1 tablet (40 mg total) by mouth daily. (NEEDS TO BE SEEN BEFORE NEXT REFILL)  Dispense: 90 tablet; Refill: 1 - fenofibrate  (TRICOR ) 145 MG tablet; TAKE 1 TABLET BY MOUTH ONCE DAILY (Needs to be seen before next refill)   Dispense: 90 tablet; Refill: 1  3. Diabetes mellitus treated with oral medication (HCC) - sitaGLIPtin -metformin  (JANUMET ) 50-1000 MG tablet; Take 1 tablet by mouth 2 (two) times daily with a meal. (NEEDS TO BE SEEN BEFORE NEXT REFILL)  Dispense: 60 tablet; Refill: 5  - Bayer DCA Hb A1c Waived  4. Prostate cancer [facility]) Report any voiding issues - tamsulosin  (FLOMAX ) 0.4 MG CAPS capsule; Take 1 capsule (0.4 mg total) by  mouth daily.  Dispense: 90 capsule; Refill: 1  5. BMI 32.0-32.9,adult Discussed diet and exercise for person with BMI >25 Will recheck weight in 3-6 months   Labs pending Health Maintenance reviewed Diet and exercise encouraged  Follow up plan: 6 months   Mary-Margaret Gladis, FNP

## 2024-02-22 LAB — CBC WITH DIFFERENTIAL/PLATELET
Basophils Absolute: 0 x10E3/uL (ref 0.0–0.2)
Basos: 0 %
EOS (ABSOLUTE): 0.1 x10E3/uL (ref 0.0–0.4)
Eos: 1 %
Hematocrit: 43.7 % (ref 37.5–51.0)
Hemoglobin: 14.1 g/dL (ref 13.0–17.7)
Immature Grans (Abs): 0 x10E3/uL (ref 0.0–0.1)
Immature Granulocytes: 0 %
Lymphocytes Absolute: 2.4 x10E3/uL (ref 0.7–3.1)
Lymphs: 42 %
MCH: 31.3 pg (ref 26.6–33.0)
MCHC: 32.3 g/dL (ref 31.5–35.7)
MCV: 97 fL (ref 79–97)
Monocytes Absolute: 0.8 x10E3/uL (ref 0.1–0.9)
Monocytes: 14 %
Neutrophils Absolute: 2.4 x10E3/uL (ref 1.4–7.0)
Neutrophils: 43 %
Platelets: 264 x10E3/uL (ref 150–450)
RBC: 4.51 x10E6/uL (ref 4.14–5.80)
RDW: 12.7 % (ref 11.6–15.4)
WBC: 5.7 x10E3/uL (ref 3.4–10.8)

## 2024-02-22 LAB — CMP14+EGFR
ALT: 13 IU/L (ref 0–44)
AST: 15 IU/L (ref 0–40)
Albumin: 3.8 g/dL — ABNORMAL LOW (ref 3.9–4.9)
Alkaline Phosphatase: 59 IU/L (ref 44–121)
BUN/Creatinine Ratio: 14 (ref 10–24)
BUN: 16 mg/dL (ref 8–27)
Bilirubin Total: 0.4 mg/dL (ref 0.0–1.2)
CO2: 22 mmol/L (ref 20–29)
Calcium: 10.2 mg/dL (ref 8.6–10.2)
Chloride: 100 mmol/L (ref 96–106)
Creatinine, Ser: 1.14 mg/dL (ref 0.76–1.27)
Globulin, Total: 2.8 g/dL (ref 1.5–4.5)
Glucose: 205 mg/dL — ABNORMAL HIGH (ref 70–99)
Potassium: 4.5 mmol/L (ref 3.5–5.2)
Sodium: 140 mmol/L (ref 134–144)
Total Protein: 6.6 g/dL (ref 6.0–8.5)
eGFR: 69 mL/min/1.73 (ref >59)

## 2024-02-22 LAB — VITAMIN B12: Vitamin B-12: 219 pg/mL — AB (ref 232–1245)

## 2024-02-22 LAB — LIPID PANEL
Cholesterol, Total: 99 mg/dL — AB (ref 100–199)
HDL: 24 mg/dL — AB (ref >39)
LDL CALC COMMENT:: 4.1 ratio (ref 0.0–5.0)
LDL Chol Calc (NIH): 53 mg/dL (ref 0–99)
Triglycerides: 119 mg/dL (ref 0–149)
VLDL Cholesterol Cal: 22 mg/dL (ref 5–40)

## 2024-02-22 LAB — MICROALBUMIN / CREATININE URINE RATIO
Creatinine, Urine: 197.9 mg/dL
Microalb/Creat Ratio: 50 mg/g{creat} — ABNORMAL HIGH (ref 0–29)
Microalbumin, Urine: 98.5 ug/mL

## 2024-02-23 ENCOUNTER — Ambulatory Visit: Payer: Self-pay | Admitting: Nurse Practitioner

## 2024-06-11 ENCOUNTER — Encounter: Payer: Self-pay | Admitting: Nurse Practitioner

## 2024-06-11 ENCOUNTER — Ambulatory Visit (INDEPENDENT_AMBULATORY_CARE_PROVIDER_SITE_OTHER): Payer: Self-pay | Admitting: Nurse Practitioner

## 2024-06-11 DIAGNOSIS — Z91199 Patient's noncompliance with other medical treatment and regimen due to unspecified reason: Secondary | ICD-10-CM

## 2024-06-11 NOTE — Progress Notes (Unsigned)
 Subjective:    Patient ID: Brandon Elliott, male    DOB: 26-Jan-1956, 68 y.o.   MRN: [id number]   Chief Complaint: medical management of chronic issues     HPI:  Brandon Elliott is a 68 y.o. who identifies as a male who was assigned male at birth.   Social history: Lives with: girlfriend Work history: runner, broadcasting/film/video   Comes in today for follow up of the following chronic medical issues:  1. Primary hypertension No c/o chhest pain, sob or headache. Doe snot check blood pressure at home. BP Readings from Last 3 Encounters:  02/21/24 135/73  09/01/23 (!) 146/76  05/30/23 118/73     2. Hyperlipidemia associated with type 2 diabetes mellitus (HCC) Does try to watch diet and stays very active. Lab Results  Component Value Date   CHOL 99 (L) 02/21/2024   HDL 24 (L) 02/21/2024   LDLCALC 53 02/21/2024   TRIG 119 02/21/2024   CHOLHDL 4.1 02/21/2024    3. Diabetes mellitus treated with oral medication (HCC) Does not check blood sugars at home very often Lab Results  Component Value Date   HGBA1C 7.1 (H) 02/21/2024  ;  4. Prostate cancer (HCC) No voiding issues Lab Results  Component Value Date   PSA1 <0.1 09/01/2023   PSA1 <0.1 08/17/2022   PSA1 <0.1 11/24/2020      5. BMI 32.0-32.9,adult No recent weight changes  Wt Readings from Last 3 Encounters:  02/21/24 165 lb (74.8 kg)  09/01/23 175 lb (79.4 kg)  05/30/23 174 lb (78.9 kg)   BMI Readings from Last 3 Encounters:  02/21/24 26.63 kg/m  09/01/23 28.25 kg/m  05/30/23 28.08 kg/m       New complaints: Urinary dysuria urgency and frequency. Seems like bladder is not completely empyting.  No Known Allergies Outpatient Encounter Medications as of 06/11/2024  Medication Sig   amLODipine  (NORVASC ) 5 MG tablet Take 1 tablet by mouth once daily   atorvastatin  (LIPITOR) 40 MG tablet Take 1 tablet (40 mg total) by mouth daily. (NEEDS TO BE SEEN BEFORE NEXT REFILL)   fenofibrate  (TRICOR ) 145 MG tablet  Take 1 tablet (145 mg total) by mouth daily.   lisinopril -hydrochlorothiazide  (ZESTORETIC ) 20-25 MG tablet Take 2 tablets by mouth daily. (Needs to be seen before next refill)   metFORMIN  (GLUCOPHAGE ) 1000 MG tablet Take 1 tablet (1,000 mg total) by mouth 2 (two) times daily with a meal.   tamsulosin  (FLOMAX ) 0.4 MG CAPS capsule Take 1 capsule (0.4 mg total) by mouth daily.   No facility-administered encounter medications on file as of 06/11/2024.    Past Surgical History:  Procedure Laterality Date   RADIOACTIVE SEED IMPLANT N/A 10/16/2012   Procedure: RADIOACTIVE SEED IMPLANT;  Surgeon: Alm GORMAN Fragmin, MD;  Location: [facility];  Service: Urology;  Laterality: N/A;  69 seeds implanted no seeds found in bladder     Family History  Problem Relation Age of Onset   Stroke Mother    Cancer Father        prostate      Controlled substance contract: n/a     Review of Systems  Constitutional:  Negative for diaphoresis.  Eyes:  Negative for pain.  Respiratory:  Negative for shortness of breath.   Cardiovascular:  Negative for chest pain, palpitations and leg swelling.  Gastrointestinal:  Negative for abdominal pain.  Endocrine: Negative for polydipsia.  Skin:  Negative for rash.  Neurological:  Negative for dizziness, weakness and headaches.  Hematological:  Does not bruise/bleed easily.  All other systems reviewed and are negative.      Objective:   Physical Exam Vitals and nursing note reviewed.  Constitutional:      Appearance: Normal appearance. He is well-developed.  HENT:     Head: Normocephalic.     Nose: Nose normal.     Mouth/Throat:     Mouth: Mucous membranes are moist.     Pharynx: Oropharynx is clear.  Eyes:     Pupils: Pupils are equal, round, and reactive to light.  Neck:     Thyroid: No thyroid mass or thyromegaly.     Vascular: No carotid bruit or JVD.     Trachea: Phonation normal.  Cardiovascular:     Rate and Rhythm: Normal  rate and regular rhythm.  Pulmonary:     Effort: Pulmonary effort is normal. No respiratory distress.     Breath sounds: Normal breath sounds.  Abdominal:     General: Bowel sounds are normal.     Palpations: Abdomen is soft.     Tenderness: There is no abdominal tenderness.  Musculoskeletal:        General: Normal range of motion.     Cervical back: Normal range of motion and neck supple.  Lymphadenopathy:     Cervical: No cervical adenopathy.  Skin:    General: Skin is warm and dry.  Neurological:     Mental Status: He is alert and oriented to person, place, and time.  Psychiatric:        Behavior: Behavior normal.        Thought Content: Thought content normal.        Judgment: Judgment normal.    There were no vitals taken for this visit.    Hgba1c 6.6%     Assessment & Plan:   Brandon Elliott comes in today with chief complaint of No chief complaint on file.   Diagnosis and orders addressed:  1. Primary hypertension Low sodium diet - CBC with Differential/Platelet - CMP14+EGFR - amLODipine  (NORVASC ) 5 MG tablet; Take 1 tablet by mouth once daily  Dispense: 90 tablet; Refill: 1 - lisinopril -hydrochlorothiazide  (ZESTORETIC ) 20-25 MG tablet; Take 2 tablets by mouth daily. (Needs to be seen before next refill)  Dispense: 180 tablet; Refill: 1  2. Hyperlipidemia associated with type 2 diabetes mellitus (HCC) Low fat diet - Lipid panel - atorvastatin  (LIPITOR) 40 MG tablet; Take 1 tablet (40 mg total) by mouth daily. (NEEDS TO BE SEEN BEFORE NEXT REFILL)  Dispense: 90 tablet; Refill: 1 - fenofibrate  (TRICOR ) 145 MG tablet; TAKE 1 TABLET BY MOUTH ONCE DAILY (Needs to be seen before next refill)  Dispense: 90 tablet; Refill: 1  3. Diabetes mellitus treated with oral medication (HCC) - sitaGLIPtin -metformin  (JANUMET ) 50-1000 MG tablet; Take 1 tablet by mouth 2 (two) times daily with a meal. (NEEDS TO BE SEEN BEFORE NEXT REFILL)  Dispense: 60 tablet; Refill: 5  - Bayer  DCA Hb A1c Waived  4. Prostate cancer [facility]) Report any voiding issues - tamsulosin  (FLOMAX ) 0.4 MG CAPS capsule; Take 1 capsule (0.4 mg total) by mouth daily.  Dispense: 90 capsule; Refill: 1  5. BMI 32.0-32.9,adult Discussed diet and exercise for person with BMI >25 Will recheck weight in 3-6 months  6. Acute cystitis Take medication as prescribe Cotton underwear Take shower not bath Cranberry juice, yogurt Force fluids AZO over the counter X2 days Culture pending RTO prn No orders of the defined types were placed in this  encounter.    Labs pending Health Maintenance reviewed Diet and exercise encouraged  Follow up plan: 6 months   Mary-Margaret Gladis, FNP

## 2024-06-12 ENCOUNTER — Encounter: Payer: Self-pay | Admitting: Nurse Practitioner

## 2024-06-25 ENCOUNTER — Ambulatory Visit: Admitting: Nurse Practitioner

## 2024-07-02 ENCOUNTER — Encounter: Payer: Self-pay | Admitting: Nurse Practitioner

## 2024-07-02 ENCOUNTER — Ambulatory Visit: Admitting: Nurse Practitioner

## 2024-07-02 VITALS — BP 140/72 | HR 72 | Temp 97.8°F | Ht 66.0 in | Wt 176.0 lb

## 2024-07-02 DIAGNOSIS — I10 Essential (primary) hypertension: Secondary | ICD-10-CM

## 2024-07-02 DIAGNOSIS — I152 Hypertension secondary to endocrine disorders: Secondary | ICD-10-CM

## 2024-07-02 DIAGNOSIS — E119 Type 2 diabetes mellitus without complications: Secondary | ICD-10-CM

## 2024-07-02 DIAGNOSIS — Z23 Encounter for immunization: Secondary | ICD-10-CM

## 2024-07-02 DIAGNOSIS — E1169 Type 2 diabetes mellitus with other specified complication: Secondary | ICD-10-CM

## 2024-07-02 DIAGNOSIS — C61 Malignant neoplasm of prostate: Secondary | ICD-10-CM

## 2024-07-02 MED ORDER — AMLODIPINE BESYLATE 5 MG PO TABS: ORAL_TABLET | ORAL | 1 refills | Status: AC

## 2024-07-02 MED ORDER — FENOFIBRATE 145 MG PO TABS: 145.0000 mg | ORAL_TABLET | Freq: Every day | ORAL | 1 refills | Status: AC

## 2024-07-02 MED ORDER — LISINOPRIL-HYDROCHLOROTHIAZIDE 20-25 MG PO TABS: 2.0000 | ORAL_TABLET | Freq: Every day | ORAL | 1 refills | Status: AC

## 2024-07-02 MED ORDER — ATORVASTATIN CALCIUM 40 MG PO TABS: 40.0000 mg | ORAL_TABLET | Freq: Every day | ORAL | 1 refills | Status: AC

## 2024-07-02 MED ORDER — TAMSULOSIN HCL 0.4 MG PO CAPS: 0.4000 mg | ORAL_CAPSULE | Freq: Every day | ORAL | 1 refills | Status: AC

## 2024-07-02 MED ORDER — METFORMIN HCL 1000 MG PO TABS: 1000.0000 mg | ORAL_TABLET | Freq: Two times a day (BID) | ORAL | 1 refills | Status: AC

## 2024-07-02 NOTE — Patient Instructions (Signed)

## 2024-07-02 NOTE — Progress Notes (Signed)
 Subjective:    Patient ID: Brandon Elliott, male    DOB: [birth year], 68 y.o.   MRN: [id number]   Chief Complaint: medical management of chronic issues     HPI:  Brandon Elliott is a 68 y.o. who identifies as a male who was assigned male at birth.   Social history: Lives with: girlfriend Work history: runner, broadcasting/film/video   Comes in today for follow up of the following chronic medical issues:  1. Primary hypertension No c/o chhest pain, sob or headache. Doe snot check blood pressure at home. BP Readings from Last 3 Encounters:  02/21/24 135/73  09/01/23 (!) 146/76  05/30/23 118/73     2. Hyperlipidemia associated with type 2 diabetes mellitus (HCC) Does try to watch diet and stays very active. Lab Results  Component Value Date   CHOL 99 (L) 02/21/2024   HDL 24 (L) 02/21/2024   LDLCALC 53 02/21/2024   TRIG 119 02/21/2024   CHOLHDL 4.1 02/21/2024   The ASCVD Risk score (Arnett DK, et al., 2019) failed to calculate for the following reasons:   The valid total cholesterol range is 130 to 320 mg/dL  3. Diabetes mellitus treated with oral medication (HCC) Does not check blood sugars at home very often Lab Results  Component Value Date   HGBA1C 7.1 (H) 02/21/2024  ;  4. History of Prostate cancer (HCC) No voiding issues Lab Results  Component Value Date   PSA1 <0.1 09/01/2023   PSA1 <0.1 08/17/2022   PSA1 <0.1 11/24/2020      5. BMI 32.0-32.9,adult No recent weight changes  Wt Readings from Last 3 Encounters:  07/02/24 176 lb (79.8 kg)  02/21/24 165 lb (74.8 kg)  09/01/23 175 lb (79.4 kg)   BMI Readings from Last 3 Encounters:  07/02/24 28.41 kg/m  02/21/24 26.63 kg/m  09/01/23 28.25 kg/m     New complaints: None today  No Known Allergies Outpatient Encounter Medications as of 07/02/2024  Medication Sig   amLODipine  (NORVASC ) 5 MG tablet Take 1 tablet by mouth once daily   atorvastatin  (LIPITOR) 40 MG tablet Take 1 tablet (40 mg total) by mouth  daily. (NEEDS TO BE SEEN BEFORE NEXT REFILL)   fenofibrate  (TRICOR ) 145 MG tablet Take 1 tablet (145 mg total) by mouth daily.   lisinopril -hydrochlorothiazide  (ZESTORETIC ) 20-25 MG tablet Take 2 tablets by mouth daily. (Needs to be seen before next refill)   metFORMIN  (GLUCOPHAGE ) 1000 MG tablet Take 1 tablet (1,000 mg total) by mouth 2 (two) times daily with a meal.   tamsulosin  (FLOMAX ) 0.4 MG CAPS capsule Take 1 capsule (0.4 mg total) by mouth daily.   No facility-administered encounter medications on file as of 07/02/2024.    Past Surgical History:  Procedure Laterality Date   RADIOACTIVE SEED IMPLANT N/A 10/16/2012   Procedure: RADIOACTIVE SEED IMPLANT;  Surgeon: Alm GORMAN Fragmin, MD;  Location: [facility];  Service: Urology;  Laterality: N/A;  69 seeds implanted no seeds found in bladder     Family History  Problem Relation Age of Onset   Stroke Mother    Cancer Father        prostate      Controlled substance contract: n/a     Review of Systems  Constitutional:  Negative for diaphoresis.  Eyes:  Negative for pain.  Respiratory:  Negative for shortness of breath.   Cardiovascular:  Negative for chest pain, palpitations and leg swelling.  Gastrointestinal:  Negative for abdominal pain.  Endocrine:  Negative for polydipsia.  Skin:  Negative for rash.  Neurological:  Negative for dizziness, weakness and headaches.  Hematological:  Does not bruise/bleed easily.  All other systems reviewed and are negative.      Objective:   Physical Exam Vitals and nursing note reviewed.  Constitutional:      Appearance: Normal appearance. He is well-developed.  HENT:     Head: Normocephalic.     Nose: Nose normal.     Mouth/Throat:     Mouth: Mucous membranes are moist.     Pharynx: Oropharynx is clear.  Eyes:     Pupils: Pupils are equal, round, and reactive to light.  Neck:     Thyroid: No thyroid mass or thyromegaly.     Vascular: No carotid bruit or JVD.      Trachea: Phonation normal.  Cardiovascular:     Rate and Rhythm: Normal rate and regular rhythm.  Pulmonary:     Effort: Pulmonary effort is normal. No respiratory distress.     Breath sounds: Normal breath sounds.  Abdominal:     General: Bowel sounds are normal.     Palpations: Abdomen is soft.     Tenderness: There is no abdominal tenderness.  Musculoskeletal:        General: Normal range of motion.     Cervical back: Normal range of motion and neck supple.  Lymphadenopathy:     Cervical: No cervical adenopathy.  Skin:    General: Skin is warm and dry.  Neurological:     Mental Status: He is alert and oriented to person, place, and time.  Psychiatric:        Behavior: Behavior normal.        Thought Content: Thought content normal.        Judgment: Judgment normal.    BP (!) 140/72   Pulse 72   Temp 97.8 F (36.6 C) (Temporal)   Ht 5' 6 (1.676 m)   Wt 176 lb (79.8 kg)   SpO2 97%   BMI 28.41 kg/m     Hgba1c 7.4%     Assessment & Plan:   Brandon Elliott comes in today with chief complaint of medical management of chronic issues    Diagnosis and orders addressed:  1. Primary hypertension Low sodium diet - CBC with Differential/Platelet - CMP14+EGFR - amLODipine  (NORVASC ) 5 MG tablet; Take 1 tablet by mouth once daily  Dispense: 90 tablet; Refill: 1 - lisinopril -hydrochlorothiazide  (ZESTORETIC ) 20-25 MG tablet; Take 2 tablets by mouth daily. (Needs to be seen before next refill)  Dispense: 180 tablet; Refill: 1  2. Hyperlipidemia associated with type 2 diabetes mellitus (HCC) Low fat diet - Lipid panel - atorvastatin  (LIPITOR) 40 MG tablet; Take 1 tablet (40 mg total) by mouth daily. (NEEDS TO BE SEEN BEFORE NEXT REFILL)  Dispense: 90 tablet; Refill: 1 - fenofibrate  (TRICOR ) 145 MG tablet; TAKE 1 TABLET BY MOUTH ONCE DAILY (Needs to be seen before next refill)  Dispense: 90 tablet; Refill: 1  3. Diabetes mellitus treated with oral medication (HCC) -  sitaGLIPtin -metformin  (JANUMET ) 50-1000 MG tablet; Take 1 tablet by mouth 2 (two) times daily with a meal. (NEEDS TO BE SEEN BEFORE NEXT REFILL)  Dispense: 60 tablet; Refill: 5  - Bayer DCA Hb A1c Waived  4. Prostate cancer [facility]) Report any voiding issues - tamsulosin  (FLOMAX ) 0.4 MG CAPS capsule; Take 1 capsule (0.4 mg total) by mouth daily.  Dispense: 90 capsule; Refill: 1  5. BMI 32.0-32.9,adult Discussed diet and  exercise for person with BMI >25 Will recheck weight in 3-6 months   Labs pending Health Maintenance reviewed Diet and exercise encouraged  Follow up plan: 6 months   Mary-Margaret Gladis, FNP

## 2024-07-03 ENCOUNTER — Ambulatory Visit: Payer: Self-pay | Admitting: Nurse Practitioner

## 2024-07-03 LAB — LIPID PANEL
Chol/HDL Ratio: 2.5 ratio (ref 0.0–5.0)
Cholesterol, Total: 108 mg/dL (ref 100–199)
HDL: 43 mg/dL (ref >39)
LDL Chol Calc (NIH): 52 mg/dL (ref 0–99)
Triglycerides: 58 mg/dL (ref 0–149)
VLDL Cholesterol Cal: 13 mg/dL (ref 5–40)

## 2024-07-03 LAB — CMP14+EGFR
ALT: 22 IU/L (ref 0–44)
AST: 21 IU/L (ref 0–40)
Albumin: 4.4 g/dL (ref 3.9–4.9)
Alkaline Phosphatase: 60 IU/L (ref 47–123)
BUN/Creatinine Ratio: 20 (ref 10–24)
BUN: 21 mg/dL (ref 8–27)
Bilirubin Total: 0.3 mg/dL (ref 0.0–1.2)
CO2: 25 mmol/L (ref 20–29)
Calcium: 10 mg/dL (ref 8.6–10.2)
Chloride: 99 mmol/L (ref 96–106)
Creatinine, Ser: 1.06 mg/dL (ref 0.76–1.27)
Globulin, Total: 2.4 g/dL (ref 1.5–4.5)
Glucose: 130 mg/dL — ABNORMAL HIGH (ref 70–99)
Potassium: 4.4 mmol/L (ref 3.5–5.2)
Sodium: 141 mmol/L (ref 134–144)
Total Protein: 6.8 g/dL (ref 6.0–8.5)
eGFR: 75 mL/min/1.73 (ref >59)

## 2024-07-03 LAB — CBC WITH DIFFERENTIAL/PLATELET
Basophils Absolute: 0 x10E3/uL (ref 0.0–0.2)
Basos: 0 %
EOS (ABSOLUTE): 0.1 x10E3/uL (ref 0.0–0.4)
Eos: 1 %
Hematocrit: 40.4 % (ref 37.5–51.0)
Hemoglobin: 13.6 g/dL (ref 13.0–17.7)
Immature Grans (Abs): 0 x10E3/uL (ref 0.0–0.1)
Immature Granulocytes: 0 %
Lymphocytes Absolute: 3.1 x10E3/uL (ref 0.7–3.1)
Lymphs: 49 %
MCH: 31.9 pg (ref 26.6–33.0)
MCHC: 33.7 g/dL (ref 31.5–35.7)
MCV: 95 fL (ref 79–97)
Monocytes Absolute: 0.6 x10E3/uL (ref 0.1–0.9)
Monocytes: 9 %
Neutrophils Absolute: 2.6 x10E3/uL (ref 1.4–7.0)
Neutrophils: 41 %
Platelets: 228 x10E3/uL (ref 150–450)
RBC: 4.27 x10E6/uL (ref 4.14–5.80)
RDW: 13.1 % (ref 11.6–15.4)
WBC: 6.4 x10E3/uL (ref 3.4–10.8)

## 2024-07-03 LAB — BAYER DCA HB A1C WAIVED: HB A1C (BAYER DCA - WAIVED): 7.4 % — ABNORMAL HIGH (ref 4.8–5.6)

## 2024-12-18 ENCOUNTER — Ambulatory Visit: Admitting: Nurse Practitioner
